# Patient Record
Sex: Male | Born: 1950 | Race: White | Hispanic: No | Marital: Married | Smoking: Former smoker
Health system: Southern US, Community
[De-identification: ages and names within clinical notes are randomized; demographics above are authoritative.]

## PROBLEM LIST (undated history)

## (undated) DIAGNOSIS — M2550 Pain in unspecified joint: Secondary | ICD-10-CM

## (undated) DIAGNOSIS — F5104 Psychophysiologic insomnia: Secondary | ICD-10-CM

## (undated) DIAGNOSIS — M069 Rheumatoid arthritis, unspecified: Secondary | ICD-10-CM

## (undated) DIAGNOSIS — Q676 Pectus excavatum: Secondary | ICD-10-CM

## (undated) DIAGNOSIS — M1990 Unspecified osteoarthritis, unspecified site: Secondary | ICD-10-CM

## (undated) DIAGNOSIS — M7630 Iliotibial band syndrome, unspecified leg: Secondary | ICD-10-CM

## (undated) DIAGNOSIS — R399 Unspecified symptoms and signs involving the genitourinary system: Secondary | ICD-10-CM

## (undated) DIAGNOSIS — I10 Essential (primary) hypertension: Secondary | ICD-10-CM

## (undated) DIAGNOSIS — Z9189 Other specified personal risk factors, not elsewhere classified: Secondary | ICD-10-CM

## (undated) DIAGNOSIS — Z973 Presence of spectacles and contact lenses: Secondary | ICD-10-CM

## (undated) DIAGNOSIS — M7662 Achilles tendinitis, left leg: Secondary | ICD-10-CM

## (undated) DIAGNOSIS — K219 Gastro-esophageal reflux disease without esophagitis: Secondary | ICD-10-CM

## (undated) DIAGNOSIS — J189 Pneumonia, unspecified organism: Secondary | ICD-10-CM

## (undated) DIAGNOSIS — M7711 Lateral epicondylitis, right elbow: Secondary | ICD-10-CM

## (undated) DIAGNOSIS — E7800 Pure hypercholesterolemia, unspecified: Secondary | ICD-10-CM

## (undated) DIAGNOSIS — R0600 Dyspnea, unspecified: Secondary | ICD-10-CM

## (undated) DIAGNOSIS — G8929 Other chronic pain: Secondary | ICD-10-CM

## (undated) DIAGNOSIS — C61 Malignant neoplasm of prostate: Secondary | ICD-10-CM

## (undated) DIAGNOSIS — M5030 Other cervical disc degeneration, unspecified cervical region: Secondary | ICD-10-CM

## (undated) DIAGNOSIS — G629 Polyneuropathy, unspecified: Secondary | ICD-10-CM

## (undated) DIAGNOSIS — M542 Cervicalgia: Secondary | ICD-10-CM

## (undated) DIAGNOSIS — H209 Unspecified iridocyclitis: Secondary | ICD-10-CM

## (undated) DIAGNOSIS — Z87898 Personal history of other specified conditions: Secondary | ICD-10-CM

## (undated) DIAGNOSIS — Z8679 Personal history of other diseases of the circulatory system: Secondary | ICD-10-CM

## (undated) HISTORY — DX: Essential (primary) hypertension: I10

## (undated) HISTORY — DX: Iliotibial band syndrome, unspecified leg: M76.30

## (undated) HISTORY — PX: CARPAL TUNNEL RELEASE: SHX101

## (undated) HISTORY — DX: Psychophysiologic insomnia: F51.04

## (undated) HISTORY — PX: TONSILLECTOMY: SUR1361

## (undated) HISTORY — PX: SHOULDER SURGERY: SHX246

## (undated) HISTORY — PX: CARPOMETACARPAL (CMC) FUSION OF THUMB: SHX6290

## (undated) HISTORY — DX: Rheumatoid arthritis, unspecified: M06.9

## (undated) HISTORY — DX: Pure hypercholesterolemia, unspecified: E78.00

## (undated) HISTORY — PX: ANTERIOR CERVICAL DECOMP/DISCECTOMY FUSION: SHX1161

---

## 1983-10-19 DIAGNOSIS — Z8679 Personal history of other diseases of the circulatory system: Secondary | ICD-10-CM

## 1983-10-19 HISTORY — DX: Personal history of other diseases of the circulatory system: Z86.79

## 2000-09-21 ENCOUNTER — Encounter: Payer: Self-pay | Admitting: Neurology

## 2000-09-21 ENCOUNTER — Ambulatory Visit (HOSPITAL_COMMUNITY): Admission: RE | Admit: 2000-09-21 | Discharge: 2000-09-21 | Payer: Self-pay | Admitting: Neurology

## 2003-11-27 ENCOUNTER — Ambulatory Visit (HOSPITAL_COMMUNITY): Admission: RE | Admit: 2003-11-27 | Discharge: 2003-11-27 | Payer: Self-pay | Admitting: Gastroenterology

## 2011-06-02 ENCOUNTER — Ambulatory Visit (INDEPENDENT_AMBULATORY_CARE_PROVIDER_SITE_OTHER): Payer: PRIVATE HEALTH INSURANCE | Admitting: Sports Medicine

## 2011-06-02 VITALS — BP 133/86 | Ht 76.0 in | Wt 194.0 lb

## 2011-06-02 DIAGNOSIS — M19049 Primary osteoarthritis, unspecified hand: Secondary | ICD-10-CM

## 2011-06-02 DIAGNOSIS — M189 Osteoarthritis of first carpometacarpal joint, unspecified: Secondary | ICD-10-CM

## 2011-06-02 MED ORDER — KETOPROFEN POWD: Status: DC

## 2011-06-02 MED ORDER — TRAMADOL HCL 50 MG PO TABS: 50.0000 mg | ORAL_TABLET | Freq: Four times a day (QID) | ORAL | Status: AC | PRN

## 2011-06-02 NOTE — Assessment & Plan Note (Signed)
Options for his [facility] joint arthritis include the following: Trial of conservative therapy for the hands including warm baths Work on easy motion of his hands to preserve his grip  If the medications listed above do not help we can consider injecting the joints  Ultimately if the pain got too severe he could see the hand surgeons  We will send a copy of this note to Dr. Doristine Counter  I would like to recheck his examination in 6 weeks.

## 2011-06-02 NOTE — Progress Notes (Signed)
  Subjective:    Patient ID: Isaiah Price, male    DOB: April 01, 1951, 60 y.o.   MRN: [id number]  HPI This patient is referred courtesy of Dr. Marjory Lies of [facility] in [place]  For past 2 years a lot of pain in both thumbs When this started it really got red and swollen After that very often more pain at end of day Steadily increasing stiffness Now can't grip door knob or open jar lid  Failed cervical fusion in 1991 Now with periodic pain that shoots into arms Ambien helps him sleep at night, hands do not wake him Uses hydrocodone as needed when pain is severe  Had bleeding problem with mobic  As documented in a recent history by Dr. Doristine Counter the patient does have multiple joint issues including cervical spine, both shoulders and lumbar spine. Today he wanted to focus on his thumbs as those were the things that are giving him the most symptoms at present     Review of Systems     Objective:   Physical Exam NAD, alert and pleasant  Tenderness at [facility] bilat Puffiness over hypothenar area from cmc to 1st MTP R > L Unable to make fist Has strong pinch bilaterally Flexion to 30 degrees on lt feels pain Flexion to 25 degrees on rt feels pain oslers nodes bilaterally Rt 2nd MTP joint swollen   Musculoskeletal ultrasound The right hand is scant over the [facility] joint in that joint has a large spur as well as a significant effusion and calcification scattered through the joint space The first and second MTP joints of the right hand showing mild changes of arthritis  The left hand shows that the [facility] joint has lost almost all of its joint space and has marked calcification and irregularity over the joint There is a smaller effusion present today  The left first MTP joint shows no significant arthritis      Assessment & Plan:

## 2011-06-02 NOTE — Patient Instructions (Addendum)
Rub ketoprofen gel on thumbs at least 4 times per day  Put hands in warm water a couple times per day and squeeze a soft ball to preserve thumb motion   Follow up in 6 weeks

## 2011-06-02 NOTE — Assessment & Plan Note (Signed)
For his pain we suggested that he use topical ketoprofen gel as often as 4-5 times a day to see if this gives him some relief. He cannot take oral NSAIDs as he has had GI bleeding with these  We also gave him some tramadol 50 mg to try to 4 times a day for his other generalized joint pain. I explained him that I thought this might be milder than the hydrocodone which really helps him. However he is reluctant to take very much hydrocodone.

## 2011-07-14 ENCOUNTER — Encounter: Payer: Self-pay | Admitting: Sports Medicine

## 2011-07-14 ENCOUNTER — Ambulatory Visit (INDEPENDENT_AMBULATORY_CARE_PROVIDER_SITE_OTHER): Payer: PRIVATE HEALTH INSURANCE | Admitting: Sports Medicine

## 2011-07-14 VITALS — BP 120/81 | HR 97 | Ht 75.0 in | Wt 190.0 lb

## 2011-07-14 DIAGNOSIS — M189 Osteoarthritis of first carpometacarpal joint, unspecified: Secondary | ICD-10-CM

## 2011-07-14 DIAGNOSIS — M19049 Primary osteoarthritis, unspecified hand: Secondary | ICD-10-CM

## 2011-07-14 DIAGNOSIS — M5412 Radiculopathy, cervical region: Secondary | ICD-10-CM

## 2011-07-14 MED ORDER — GABAPENTIN 300 MG PO CAPS: 300.0000 mg | ORAL_CAPSULE | Freq: Every day | ORAL | Status: DC

## 2011-07-14 NOTE — Progress Notes (Signed)
  Subjective:    Patient ID: Isaiah Price, male    DOB: 15-Sep-1951, 60 y.o.   MRN: [id number]  HPI 1.  Thumb pain:  Here for follow up of thumb pain.  Diagnosed with arthritis of the [facility] at prior visit.  Has been using ketoprofen gel since that time, which has helped some.  He still has pain with gripping and feels like he does not have good strength in his thumbs.  He was taking tramadol which was helping somewhat, but he has stopped this secondary to prostate issues.  Of note he does have a history of an anterior cervical diskectomy with a failed fusion of that area.  He does get occasional numbness and tingling down his arms and hands related to this. He does get pain with too much neck motion and also does some low back pain at times   Review of Systems     Objective:   Physical Exam Hand exam: Inspection of hands shows thenar wasting as well as squaring at the [facility] joint bilaterally.  He is tender to palpation along the [facility] joint, with mild swelling. AROM is limited in all directions, especially flexion, PROM is mildly painful but overall pretty good Strength in thumb is 4/5 with flexion, extension, adduction and abduction  Cervical motion is somewhat stiff and extension can be uncomfortable         Assessment & Plan:

## 2011-07-14 NOTE — Assessment & Plan Note (Signed)
See the attached plan. We will start at a very low dose of gabapentin. Hopefully he can stop the Ambien. If gabapentin works well we may consider building the dose later  [place]bon copy to Dr. Doristine Counter

## 2011-07-14 NOTE — Assessment & Plan Note (Addendum)
Pain is improved however strength is still decreased.  I suspect he may have a radiculopathy contributing to his pain and weakness given his cervical disk disease and signs of thenar wasting.  We will start gabapentin 300mg  qhs for now and have him continue his ketoprofen gel.  Suggested he could try paraffin wax dips to help with stiffness.  We will have him follow up in 4-6 weeks.  If the [facility] pain worsens we will inject him under ultrasound guidance

## 2011-07-30 ENCOUNTER — Other Ambulatory Visit: Payer: Self-pay | Admitting: Gastroenterology

## 2011-08-25 ENCOUNTER — Ambulatory Visit: Payer: PRIVATE HEALTH INSURANCE | Admitting: Sports Medicine

## 2011-10-19 HISTORY — PX: CATARACT EXTRACTION W/ INTRAOCULAR LENS  IMPLANT, BILATERAL: SHX1307

## 2012-05-11 ENCOUNTER — Ambulatory Visit (INDEPENDENT_AMBULATORY_CARE_PROVIDER_SITE_OTHER): Payer: PRIVATE HEALTH INSURANCE | Admitting: Sports Medicine

## 2012-05-11 VITALS — BP 147/89

## 2012-05-11 DIAGNOSIS — M189 Osteoarthritis of first carpometacarpal joint, unspecified: Secondary | ICD-10-CM

## 2012-05-11 DIAGNOSIS — M19049 Primary osteoarthritis, unspecified hand: Secondary | ICD-10-CM

## 2012-05-11 NOTE — Assessment & Plan Note (Signed)
He also has significant DIP involvement  He has pain at first MCPs but [place] does not show much DJD in these

## 2012-05-11 NOTE — Progress Notes (Signed)
Isaiah Price is a 61 y.o. male who presents to [facility] today for reevaluation of bilateral thumb CMC arthritis.  Patient was seen last year for bilateral CMC thumb arthritis which was confirmed under ultrasound examination.  The patient went to a hand surgeon who performed an unguided injections in the bilateral thumb CMC.  This had lasting benefit however the procedure was painful.  The injection lasted about 6 months.  He notes continued gradual onset of swelling and pain especially with flexion and abduction of the thumb.  He takes intermittent hydrocodone.  He denies any fevers or chills or loss of sensation in his hands.     PMH reviewed.  History  Substance Use Topics  . Smoking status: Current Everyday Smoker    Types: Cigarettes  . Smokeless tobacco: Not on file   Comment: 8 cigarettes per day  . Alcohol Use: Not on file   ROS as above otherwise neg   Exam:  BP 147/89 Gen: Well NAD MSK: *Examination shows tenderness at the thumb CMC mild synovitis bilaterally.  Strength is intact. Negative Finkelstein's test.  Hand is neurovascularly intact.  Patient has arthritis of the DIP joints on multiple fingers in both hands.   Procedure note: Right thumb CMC injection.  Consent obtained and timeout performed.  Area cleaned with alcohol. And cold spray applied. Sterile ultrasound gel over a sterilized ultrasound probe was placed on to the sterile area to visualize the [facility] joint.  Then a needle was inserted into the joint under ultrasound guidance and a small amount of fluid was injected showing distention of the joint.  5 mg of Kenalog and 0.5 mL of 1% lidocaine were injected into the joint.  Patient tolerated procedure well without any pain.    Procedure note: Left thumb CMC injection.  Consent obtained and timeout performed.  Area cleaned with alcohol. And cold spray applied. Sterile ultrasound gel over a sterilized ultrasound probe was placed on to the sterile area to visualize the  [facility] joint.  Then a needle was inserted into the joint under ultrasound guidance and a small amount of fluid was injected showing distention of the joint.  5 mg of Kenalog and 0.5 mL of 1% lidocaine were injected into the joint.  Patient tolerated procedure well without any pain.

## 2012-05-11 NOTE — Patient Instructions (Addendum)
Thank you for coming in today. I hope that you get lasting benefit from this procedure.  Come back as needed.  Call us if you have fevers, chills or a bright red swollen joint.  You may have some pain tonight but let us know if it is terrible.

## 2012-05-11 NOTE — Assessment & Plan Note (Addendum)
Patient has bilateral CMC thumb arthritis. This is a chronic issue that has worsened bilaterally.  Both CMC joints were injected today under ultrasound guidance.  Plan to followup as needed. Discussed warning signs or symptoms. Please see discharge instructions. Patient expresses understanding. Hopefully this should last several months

## 2012-05-15 ENCOUNTER — Ambulatory Visit (INDEPENDENT_AMBULATORY_CARE_PROVIDER_SITE_OTHER): Payer: PRIVATE HEALTH INSURANCE | Admitting: Sports Medicine

## 2012-05-15 ENCOUNTER — Encounter: Payer: Self-pay | Admitting: Sports Medicine

## 2012-05-15 VITALS — BP 130/80

## 2012-05-15 DIAGNOSIS — M65839 Other synovitis and tenosynovitis, unspecified forearm: Secondary | ICD-10-CM

## 2012-05-15 DIAGNOSIS — M659 Synovitis and tenosynovitis, unspecified: Secondary | ICD-10-CM

## 2012-05-15 MED ORDER — PREDNISONE (PAK) 10 MG PO TABS: 10.0000 mg | ORAL_TABLET | Freq: Every day | ORAL | Status: DC

## 2012-05-15 NOTE — Progress Notes (Signed)
  Subjective:    Patient ID: Isaiah Price, male    DOB: 11/07/1950, 61 y.o.   MRN: [id number]  HPI Cc: Right hand swelling  Patient comes in today complaining of 3-4 days of swelling and pain across the dorsum of his right hand. He received an injection into the right [facility] joint 4 days ago. Joint was a little sore the following day but in the days following the injection he is having increased pain and swelling in the dorsum of the right wrist. Symptoms are worse with finger extension and right wrist extension. Some radiating pain up into the forearm. No fevers or chills. No history of gout. No recent trauma. Only activity that he can recall that may have been a contributing factor was some yard work he did a week ago. No insect bites.   Review of Systems     Objective:   Physical Exam Well-developed, well-nourished. No acute distress  Right wrist shows moderate swelling across the dorsum. Diffuse tenderness to palpation. Pain is worse with active finger extension and wrist extension. No erythema. No skin lesions. Good radial ulnar pulses. Median radial and ulnar nerve function are all intact distally.  MSK ultrasound of the right wrist: Quick view of the dorsum of the wrist shows swelling around the tendons of the third extensor compartment. I do not appreciate any wrist effusion.       Assessment & Plan:  1. Right wrist pain and swelling secondary to extensor tendonosynovitis 2. 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 Although NSAIDS are listed as an allergy on his chart, he has taken prednisone in the past without any difficulty,  He has a body helix wrist brace to wear. Patient will followup with me in 24-hours. If symptoms persist or worsen, I would consider blood work including a CBC, sed rate, and possibly a serum uric acid level.

## 2012-05-16 ENCOUNTER — Encounter: Payer: Self-pay | Admitting: Sports Medicine

## 2012-05-16 ENCOUNTER — Ambulatory Visit (INDEPENDENT_AMBULATORY_CARE_PROVIDER_SITE_OTHER): Payer: PRIVATE HEALTH INSURANCE | Admitting: Sports Medicine

## 2012-05-16 VITALS — BP 136/80

## 2012-05-16 DIAGNOSIS — M65849 Other synovitis and tenosynovitis, unspecified hand: Secondary | ICD-10-CM

## 2012-05-16 DIAGNOSIS — M659 Synovitis and tenosynovitis, unspecified: Secondary | ICD-10-CM

## 2012-05-16 NOTE — Progress Notes (Signed)
  Subjective:    Patient ID: Isaiah Price, male    DOB: [birth year], 61 y.o.   MRN: [id number]  HPI patient comes in today for followup on his right wrist. He started his Sterapred Dosepak yesterday. Pain is somewhat better but he has noticed improvement with wrist mobility. Still no fevers or chills. He is also been using his compression wrap as well as ice.    Review of Systems     Objective:   Physical Exam Well-developed, well-nourished. No acute distress.  Right wrist still shows some mild swelling diffusely across the dorsum of the wrist. He still has limited wrist extension by about 50% but nearly full wrist flexion. No erythema. Area is not warm to touch. Good radial and ulnar pulses. Sensation remains intact distally.   MSK ultrasound right wrist: Quick scan of the dorsum of the wrist shows decreased swelling around the extensor tendons of the fourth compartment, but some edema is still present.       Assessment & Plan:  Improving right wrist pain likely due to steroid flare from recent [facility] injection  We would like for the patient to continue on his Dosepak, but he has a questionable history of early glaucoma. Also a history of uveitis/iritis. He'll contact his ophthalmologist to ensure that it is safe for him to continue on the Dosepak. Continue with his compression wrap and continue with ice. Increase activity as tolerated and followup with me in one week. Call symptoms worsen in the interim.

## 2012-05-19 ENCOUNTER — Other Ambulatory Visit: Payer: Self-pay | Admitting: *Deleted

## 2012-05-19 MED ORDER — PREDNISONE 10 MG PO KIT: PACK | ORAL | Status: DC

## 2012-05-19 NOTE — Progress Notes (Signed)
Pt states his wrist is still significantly painful with minimal improvement since 05/16/12.  He has 2 days of prednisone left, and is taking vicodin that he already had for pain.  Dr. Margaretha Sheffield spoke with pt- who is scheduled for f/u here Monday- he ordered Prednisone DS dose pack 12 day for pt.  Sent to pt's pharamcy- pt is aware of instructions.

## 2012-05-22 ENCOUNTER — Encounter: Payer: Self-pay | Admitting: Sports Medicine

## 2012-05-22 ENCOUNTER — Ambulatory Visit (INDEPENDENT_AMBULATORY_CARE_PROVIDER_SITE_OTHER): Payer: PRIVATE HEALTH INSURANCE | Admitting: Sports Medicine

## 2012-05-22 VITALS — BP 132/86 | HR 77

## 2012-05-22 DIAGNOSIS — M778 Other enthesopathies, not elsewhere classified: Secondary | ICD-10-CM

## 2012-05-22 DIAGNOSIS — M2550 Pain in unspecified joint: Secondary | ICD-10-CM

## 2012-05-22 LAB — CBC
HCT: 44.4 % (ref 39.0–52.0)
Hemoglobin: 15.6 g/dL (ref 13.0–17.0)
RDW: 13.3 % (ref 11.5–15.5)
WBC: 6.6 10*3/uL (ref 4.0–10.5)

## 2012-05-22 LAB — RHEUMATOID FACTOR: Rhuematoid fact SerPl-aCnc: 12 IU/mL (ref <=14)

## 2012-05-22 MED ORDER — PREDNISONE 10 MG PO KIT: PACK | ORAL | Status: DC

## 2012-05-22 NOTE — Progress Notes (Signed)
  Subjective:    Patient ID: Isaiah Price, male    DOB: 08-Nov-1950, 61 y.o.   MRN: [id number]  HPI patient comes in today for followup on right hand pain. I tried to call in a 12 day Sterapred Dosepak over the weekend but unfortunately there was a mix up at the pharmacy. Patient has completed his initial 6 day Dosepak. Pain in his wrist and hand are somewhat better but not resolved. Majority of his pain continues to be in the dorsum of his hand and wrist. No fevers or chills. He is taking Vicodin at night.    Review of Systems     Objective:   Physical Exam  No acute distress. Right hand and wrist still shows some tenderness to palpation along the fourth extensor compartment. Mild soft tissue swelling. No erythema. He has good wrist flexion but limited extension. Good radial and ulnar pulses. Median radial and ulnar nerve function are intact.      Assessment & Plan:  Persistent right wrist pain and swelling  12 day Sterapred Dosepak to take as directed. Of note, the patient did check with his ophthalmologist last week to make sure that it was safe for him to take his medicine. I will check some blood work including a CBC, ANA, rheumatoid factor, sed rate, C-reactive protein, and serum uric acid. I will followup with him via telephone tomorrow with his initial blood work. Although this is likely a tenosynovitis, I cannot completely exclude gout. He can continue on Vicodin each bedtime for severe pain as well.

## 2012-05-25 ENCOUNTER — Telehealth: Payer: Self-pay | Admitting: Sports Medicine

## 2012-05-25 ENCOUNTER — Telehealth: Payer: Self-pay | Admitting: *Deleted

## 2012-05-25 NOTE — Telephone Encounter (Signed)
Spoke with pt- he states his wrist is doing much better- pain less intense, less swelling, increased flexibility.  He is able to use hand more regularly. Advised him to continue following tx plan that he and Dr. Margaretha Sheffield discussed.  Pt agreeable.

## 2012-05-25 NOTE — Telephone Encounter (Signed)
Spoke with patient on the phone about results on 8/6. Bloodwork WNL. No evidence of inflammatory arthropathy. Serum uric acid is WNL. Patient feeling much better on 12 day sterapred dose pak. Will finish prednisone and let me know if symptoms do not resolve.

## 2012-12-11 ENCOUNTER — Ambulatory Visit (INDEPENDENT_AMBULATORY_CARE_PROVIDER_SITE_OTHER): Payer: BC Managed Care – PPO | Admitting: Sports Medicine

## 2012-12-11 ENCOUNTER — Ambulatory Visit
Admission: RE | Admit: 2012-12-11 | Discharge: 2012-12-11 | Disposition: A | Discharge status: Home / Self Care | Payer: BC Managed Care – PPO | Source: Ambulatory Visit | Attending: Sports Medicine | Admitting: Sports Medicine

## 2012-12-11 VITALS — BP 126/78 | Ht 76.0 in | Wt 215.0 lb

## 2012-12-11 DIAGNOSIS — M189 Osteoarthritis of first carpometacarpal joint, unspecified: Secondary | ICD-10-CM

## 2012-12-11 DIAGNOSIS — M79609 Pain in unspecified limb: Secondary | ICD-10-CM

## 2012-12-11 DIAGNOSIS — M79645 Pain in left finger(s): Secondary | ICD-10-CM

## 2012-12-11 DIAGNOSIS — M79644 Pain in right finger(s): Secondary | ICD-10-CM

## 2012-12-11 DIAGNOSIS — M19049 Primary osteoarthritis, unspecified hand: Secondary | ICD-10-CM

## 2012-12-12 ENCOUNTER — Telehealth: Payer: Self-pay | Admitting: Sports Medicine

## 2012-12-12 ENCOUNTER — Telehealth: Payer: Self-pay | Admitting: *Deleted

## 2012-12-12 NOTE — Progress Notes (Signed)
  Subjective:    Patient ID: Isaiah Price, male    DOB: 10-18-1951, 62 y.o.   MRN: [id number]  HPI chief complaint: Bilateral thumb pain  Patient comes in today requesting injections into each of his thumbs. He has a history of bilateral CMC osteoarthritis. He's had 2 previous injections in his thumbs. Last injection was in July. He's had good symptom relief up until a couple of months ago. His left thumb is worse than his right. He's noticed significant decreased range of motion with the left thumb. Pain with gripping and with twisting. No recent trauma. No recent x-rays.  Medical history is unchanged. He is in the process of being referred to a rheumatologist by his primary care physician. He keeps experiencing unexplained swelling as well as diffuse arthralgias. He has had swelling both in his wrist and his foot. He denies any swelling today however.    Review of Systems     Objective:   Physical Exam Well-developed, well-nourished. No acute distress  Examination of each of his thumbs shows limited mobility at the [facility] joint left greater than right. Positive CMC grind bilaterally. No joint effusion. No soft tissue swelling. No tenderness or swelling along the first extensor compartment. Decreased grip bilaterally secondary to pain. He is neurovascular intact distally.       Assessment & Plan:  1. Bilateral thumb pain secondary to [facility] osteoarthritis  I've agreed to reinject each of his CMC joints today. Injections were performed under ultrasound guidance under sterile technique after risks and benefits were explained. This is his third injection in each thumb and I am not overly optimistic that he will get significant symptom relief with continued injections. I've ordered x-rays of each of his thumbs anticipation of referral him to Dr. Amanda Pea. Patient is in agreement with this plan. He states that he is willing to proceed with surgery if necessary. I will call him after I reviewed his  x-raysleft CMC joint.  Consent obtained and verified. Time-out conducted. Noted no overlying erythema, induration, or other signs of local infection. Skin prepped in a sterile fashion. Topical analgesic spray: Ethyl chloride. Joint: left CMC joint Needle: 25g 5/8 inch Completed without difficulty. Meds: 0.5cc 1% lidocaine, 0.5cc of 40mg  depomedrol  Advised to call if fevers/chills, erythema, induration, drainage, or persistent bleeding.   Consent obtained and verified. Time-out conducted. Noted no overlying erythema, induration, or other signs of local infection. Skin prepped in a sterile fashion. Topical analgesic spray: Ethyl chloride. Joint: right CMC joint Needle: 5/8 inch 25g Completed without difficulty. Meds: 0.5cc 1% lidocaine, 0.5cc of 40mg  depomedrol  Advised to call if fevers/chills, erythema, induration, drainage, or persistent bleeding.

## 2012-12-12 NOTE — Telephone Encounter (Signed)
Message copied by Mora Bellman on Tue Dec 12, 2012  1:53 PM ------      Message from: Ralene Cork      Created: Tue Dec 12, 2012  9:48 AM       Please refer to Dr. Amanda Pea at [facility] orthopedics for bilateral end-stage [facility] osteoarthritis.            ----- Message -----         From: Rad Results In Interface         Sent: 12/11/2012   4:23 PM           To: Ralene Cork, DO                   ------

## 2012-12-12 NOTE — Telephone Encounter (Signed)
Referred pt to Dr. Amanda Pea- appt is 02/14/13 at 9 am.  Pt notified of appt info.

## 2012-12-12 NOTE — Telephone Encounter (Signed)
I spoke with Fayrene Fearing today on the phone regarding x-rays of his thumbs. As expected, he has end-stage degenerative changes of each of his CMC joints. I recommended referral to Dr. Onalee Hua for further treatment. Patient is in agreement with this plan. Followup with me when necessary.

## 2013-06-25 ENCOUNTER — Ambulatory Visit (INDEPENDENT_AMBULATORY_CARE_PROVIDER_SITE_OTHER): Payer: BC Managed Care – PPO | Admitting: Sports Medicine

## 2013-06-25 VITALS — BP 122/84 | Ht 75.0 in | Wt 195.0 lb

## 2013-06-25 DIAGNOSIS — M79644 Pain in right finger(s): Secondary | ICD-10-CM

## 2013-06-25 DIAGNOSIS — M79645 Pain in left finger(s): Secondary | ICD-10-CM

## 2013-06-25 DIAGNOSIS — M79609 Pain in unspecified limb: Secondary | ICD-10-CM

## 2013-06-25 DIAGNOSIS — M19049 Primary osteoarthritis, unspecified hand: Secondary | ICD-10-CM

## 2013-06-25 DIAGNOSIS — M189 Osteoarthritis of first carpometacarpal joint, unspecified: Secondary | ICD-10-CM

## 2013-06-25 MED ORDER — METHYLPREDNISOLONE ACETATE 40 MG/ML IJ SUSP
20.0000 mg | Freq: Once | INTRAMUSCULAR | Status: AC
  Administered 2013-06-25: 20 mg via INTRA_ARTICULAR

## 2013-06-26 NOTE — Progress Notes (Signed)
  Subjective:    Patient ID: Isaiah Price, male    DOB: 1951/03/25, 62 y.o.   MRN: [id number]  HPI Patient comes in today requesting repeat bilateral CMC injections. He has a well-documented history of advanced CMC osteoarthritis in each thumb. He has recently seen Dr Amanda Pea and the patient is planning on having surgery in January. He has had a total of 4 injections into each CMC joint. He tells me that Dr. Amanda Pea said that he would be comfortable injecting him monthly until his surgery. However, the last injection that Dr. Amanda Pea did was not under ultrasound guidance and caused the patient quite a bit of pain. Furthermore he did not get any prolonged relief of his pain.    Review of Systems     Objective:   Physical Exam Well-developed, well-nourished. No acute distress. Awake alert and oriented x3  Examination of each thumb shows a markedly positive CMC grind. Limited CMC motion with flexion and extension which reproduces pain. There is prominence of each CMC joint due to joint subluxation. Neurovascularly intact distally at the tip of each thumb.  Recent x-rays of each thumb show marked CMC DJD with joint subluxation       Assessment & Plan:  Bilateral thumb pain secondary to advanced CMC osteoarthritis  I will reinject each CMC joint today under ultrasound. I explained to the patient that there is a good chance that these injections will not help his pain and that surgery is the definitive treatment. He understands. His plan is to move the surgery up from January if he continues to have pain despite today's injections. Followup when necessary.  Consent obtained and verified. Time-out conducted. Noted no overlying erythema, induration, or other signs of local infection. Skin prepped in a sterile fashion. Topical analgesic spray: Ethyl chloride. Joint: left cmc joint Needle: 25g 5/8 inch Completed without difficulty. Meds: 0.5 cc 1% xylocaine, 0.5cc (20mg ) depomedrol  Advised  to call if fevers/chills, erythema, induration, drainage, or persistent bleeding.  Consent obtained and verified. Time-out conducted. Noted no overlying erythema, induration, or other signs of local infection. Skin prepped in a sterile fashion. Topical analgesic spray: Ethyl chloride. Joint: right cmc joint Needle: 25g 5/8 inch Completed without difficulty. Meds: 0.5cc 1% xylocaine, 0.5cc (20mg ) depomedrol  Advised to call if fevers/chills, erythema, induration, drainage, or persistent bleeding.

## 2013-07-04 ENCOUNTER — Ambulatory Visit: Payer: BC Managed Care – PPO | Admitting: Sports Medicine

## 2013-10-03 ENCOUNTER — Ambulatory Visit (INDEPENDENT_AMBULATORY_CARE_PROVIDER_SITE_OTHER): Payer: BC Managed Care – PPO | Admitting: Sports Medicine

## 2013-10-03 VITALS — BP 116/77

## 2013-10-03 DIAGNOSIS — M19049 Primary osteoarthritis, unspecified hand: Secondary | ICD-10-CM

## 2013-10-03 DIAGNOSIS — M189 Osteoarthritis of first carpometacarpal joint, unspecified: Secondary | ICD-10-CM

## 2013-10-03 MED ORDER — METHYLPREDNISOLONE ACETATE 40 MG/ML IJ SUSP
40.0000 mg | Freq: Once | INTRAMUSCULAR | Status: AC
  Administered 2013-10-03: 40 mg via INTRA_ARTICULAR

## 2013-10-03 NOTE — Progress Notes (Signed)
CC: Bilateral thumb pain followup HPI: Patient is a very pleasant 62 year old male with known bilateral end-stage DJD of the [facility] joints who presents for followup of thumb pain. He states that the last injections were very helpful and helped him for 2-3 months. However now his pain has returned. He has been seeing Dr. Amanda Pea for surgical consultation and plans on having his bilateral CMC joints replaced. He is going to have his right [facility] joint done first and is planning on having surgery January or February.  ROS: As above in the HPI. All other systems are stable or negative.  OBJECTIVE: APPEARANCE:  Patient in no acute distress.The patient appeared well nourished and normally developed. HEENT: No scleral icterus. Conjunctiva non-injected Resp: Non labored Skin: No rash MSK:  Thumbs: - positive CMC grind - limited CMC motion with flexion and extension  - prominence of each CMC joint due to joint subluxation - Neurovascularly intact distally   ASSESSMENT: #1. Bilateral end-stage DJD of the [facility] joint   PLAN: Patient would like to go forward with repeat CMC joint injection bilaterally today. We discussed this over the phone with Dr. Amanda Pea in order to avoid a possible consequence of delaying surgery. Dr. Amanda Pea confirmed that injection today is okay and would not delay surgery. Patient was consented for ultrasound-guided [facility] joint injection and risks and benefits discussed.  Consent obtained and verified.  Time-out conducted.  Noted no overlying erythema, induration, or other signs of local infection.  Skin prepped in a sterile fashion.  Topical analgesic spray: Ethyl chloride.  Joint: left cmc joint under ultrasound guidance Needle: 25g 5/8 inch  Completed without difficulty.  Meds: 0.5 cc 1% xylocaine, 0.5cc (20mg ) depomedrol  Advised to call if fevers/chills, erythema, induration, drainage, or persistent bleeding.   Consent obtained and verified.  Time-out conducted.  Noted no  overlying erythema, induration, or other signs of local infection.  Skin prepped in a sterile fashion.  Topical analgesic spray: Ethyl chloride.  Joint: right cmc joint under ultrasound guidance Needle: 25g 5/8 inch  Completed without difficulty.  Meds: 0.5cc 1% xylocaine, 0.5cc (20mg ) depomedrol  Advised to call if fevers/chills, erythema, induration, drainage, or persistent bleeding.

## 2014-01-09 ENCOUNTER — Other Ambulatory Visit: Payer: Self-pay | Admitting: Family Medicine

## 2014-01-09 ENCOUNTER — Ambulatory Visit
Admission: RE | Admit: 2014-01-09 | Discharge: 2014-01-09 | Disposition: A | Discharge status: Home / Self Care | Payer: BC Managed Care – PPO | Source: Ambulatory Visit | Attending: Family Medicine | Admitting: Family Medicine

## 2014-01-09 DIAGNOSIS — M5412 Radiculopathy, cervical region: Secondary | ICD-10-CM

## 2014-01-09 MED ORDER — GADOBENATE DIMEGLUMINE 529 MG/ML IV SOLN
19.0000 mL | Freq: Once | INTRAVENOUS | Status: AC | PRN
  Administered 2014-01-09: 19 mL via INTRAVENOUS

## 2014-02-07 ENCOUNTER — Other Ambulatory Visit: Payer: Self-pay | Admitting: Neurosurgery

## 2014-02-07 DIAGNOSIS — M47812 Spondylosis without myelopathy or radiculopathy, cervical region: Secondary | ICD-10-CM

## 2014-02-18 ENCOUNTER — Other Ambulatory Visit: Payer: Self-pay | Admitting: Neurosurgery

## 2014-02-18 ENCOUNTER — Ambulatory Visit
Admission: RE | Admit: 2014-02-18 | Discharge: 2014-02-18 | Disposition: A | Discharge status: Home / Self Care | Payer: BC Managed Care – PPO | Source: Ambulatory Visit | Attending: Neurosurgery | Admitting: Neurosurgery

## 2014-02-18 DIAGNOSIS — M47812 Spondylosis without myelopathy or radiculopathy, cervical region: Secondary | ICD-10-CM

## 2014-02-18 MED ORDER — DEXAMETHASONE SODIUM PHOSPHATE 4 MG/ML IJ SOLN
8.0000 mg | Freq: Once | INTRAMUSCULAR | Status: AC
  Administered 2014-02-18: 8 mg via INTRA_ARTICULAR

## 2014-02-18 NOTE — Discharge Instructions (Signed)

## 2014-03-07 ENCOUNTER — Encounter: Payer: Self-pay | Admitting: Sports Medicine

## 2014-03-07 ENCOUNTER — Ambulatory Visit (INDEPENDENT_AMBULATORY_CARE_PROVIDER_SITE_OTHER): Payer: BC Managed Care – PPO | Admitting: Sports Medicine

## 2014-03-07 VITALS — BP 135/90 | Ht 75.0 in | Wt 204.0 lb

## 2014-03-07 DIAGNOSIS — M5412 Radiculopathy, cervical region: Secondary | ICD-10-CM

## 2014-03-07 NOTE — Progress Notes (Signed)
Patient ID: Isaiah Price, male   DOB: 09-30-1951, 63 y.o.   MRN: [id number]    [facility] [address] [place], [place] [address] Phone: [phone] Fax: [phone]   Patient Name: Isaiah Price Date of Birth: 07/17/1951 Medical Record Number: [id number] Gender: male Date of Encounter: 03/07/2014  History of Present Illness:  Isaiah Price is a 63 y.o. very pleasant male patient who presents with the following:  Neck pain.   He states that he had a ruptured disk at C5-C6 in 1991 and had a failed cervical fusion at that time. Since then he has had intermittent BL arm pain with radiculopathy. Occasionally, approx 2 x per year, he is having sharp L arm pain he equates with a "pinched nerve". He states that these episodes usually resolve in 4-5 days.   In January he had carpel tunnel release and a [place] surgery for CMC OA. He was doing rehab exercises when he had a recurrence of the symptoms which lasted an atypical 20 days. The pain was so bad he was referred back to Dr. Rita Ohara, his neurosurgeon, who got a new MRI. He offered two additional surgeries which he advised may not be the best course of action. He had bilateral facet injections at C3-C4 and C4-C5 which helped about 2.5 weeks later. He has been back at his basel;ine pain level for about 1 week now.   HE describes his pain as L neck pain "like having a nail driven into his head" radiating to his posterior L arm and down to his elbow in the C7 Distribution.   He has had some improvement with dose packs in the past. He did not tolerate gabapentin. He has taken hydrocodone off and on for th last 6 years to manage his pain.   Patient Active Problem List   Diagnosis Date Noted  . Cervical radiculopathy 07/14/2011  . Arthritis pain, hand 06/02/2011  . 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 No past surgical history on file. History  Substance Use Topics    . Smoking status: Former Smoker    Types: Cigarettes    Quit date: 12/13/2011  . Smokeless tobacco: Never Used  . Alcohol Use: Not on file   No family history on file. Allergies  Allergen Reactions  . Nsaids     Medication list has been reviewed and updated.  Prior to Admission medications   Medication Sig Start Date End Date Taking? Authorizing Provider  diltiazem (CARDIZEM) 90 MG tablet Take 90 mg by mouth 2 (two) times daily.      Historical Provider, MD  ezetimibe (ZETIA) 10 MG tablet Take 10 mg by mouth daily.      Historical Provider, MD  HYDROcodone-acetaminophen (NORCO) 10-325 MG per tablet Take 1 tablet by mouth every 6 (six) hours as needed.    Historical Provider, MD  methylPREDNIsolone (MEDROL DOSPACK) 4 MG tablet  01/07/14   Historical Provider, MD  oxyCODONE (OXY IR/ROXICODONE) 5 MG immediate release tablet  01/12/14   Historical Provider, MD  PredniSONE 10 MG KIT 12 day Prednisone DS dose pack, take as directed. 05/22/12   Carlos Levering Draper, DO  simvastatin (ZOCOR) 40 MG tablet Take 40 mg by mouth every morning.      Historical Provider, MD  tamsulosin (FLOMAX) 0.4 MG CAPS capsule  01/31/14   Historical Provider, MD  zolpidem (AMBIEN) 10 MG tablet Take 10 mg by mouth at bedtime.  Historical Provider, MD    Review of Systems:  Per HPI  Physical Examination: Filed Vitals:   03/07/14 1104  BP: 135/90   Filed Vitals:   03/07/14 1102  Height: 6' 3"  (1.905 m)  Weight: 204 lb (92.534 kg)   Body mass index is 25.5 kg/(m^2).  Gen: NAD, alert, cooperative with exam HEENT: NCAT Neuro: Alert and oriented, 1+ reflexes on R at brachioradialis and biceps, 2+ at triceps L UE with 2+ reflexes at brachiradialis and biceps and 3+ at triceps.  Strength equal and 5/5 in BL UE Paraesthesias described in the distribution of C7 but not radiating below the elbow.   MRI cervical spine 01/09/2014: C5-C6 Fusion, Acute marrow edema at C2-C3 and C4-C5 facets, Severe R and mod L C 7  foraminal stenosis, Severe R and mild L C5 forminal stenosis, moderate L C3 and C4 foraminal stenosis, mild L C8 foraminal stenosis.    Assessment and Plan:   Cervical radiculopathy Recent exacerbation of chronic neck pain seems to be resolving.  - Some improvement after facet injections, Saw Dr. Jola Baptist at [facility] imaging - discussed future treatment plans of pursuing dose pack, repeat facet injections, and PRN Norco.  - Agreed with him and Dr. Rita Ohara that additional surgery os likely not the best course of treatment - follow up PRN     Timmothy Euler, MD

## 2014-03-07 NOTE — Assessment & Plan Note (Signed)
Recent exacerbation of chronic neck pain seems to be resolving.  - Some improvement after facet injections, Saw Dr. Jola Baptist at [facility] imaging - discussed future treatment plans of pursuing dose pack, repeat facet injections, and PRN Norco.  - Agreed with him and Dr. Rita Ohara that additional surgery os likely not the best course of treatment - follow up PRN

## 2014-03-14 ENCOUNTER — Other Ambulatory Visit: Payer: Self-pay | Admitting: *Deleted

## 2014-03-14 DIAGNOSIS — M5412 Radiculopathy, cervical region: Secondary | ICD-10-CM

## 2014-03-15 ENCOUNTER — Other Ambulatory Visit: Payer: Self-pay | Admitting: Sports Medicine

## 2014-03-15 DIAGNOSIS — M542 Cervicalgia: Secondary | ICD-10-CM

## 2014-04-22 ENCOUNTER — Other Ambulatory Visit: Payer: BC Managed Care – PPO

## 2014-04-23 ENCOUNTER — Ambulatory Visit (INDEPENDENT_AMBULATORY_CARE_PROVIDER_SITE_OTHER): Payer: BC Managed Care – PPO | Admitting: Sports Medicine

## 2014-04-23 ENCOUNTER — Encounter: Payer: Self-pay | Admitting: Sports Medicine

## 2014-04-23 ENCOUNTER — Ambulatory Visit
Admission: RE | Admit: 2014-04-23 | Discharge: 2014-04-23 | Disposition: A | Discharge status: Home / Self Care | Payer: BC Managed Care – PPO | Source: Ambulatory Visit | Attending: Sports Medicine | Admitting: Sports Medicine

## 2014-04-23 VITALS — BP 123/78 | Ht 75.0 in | Wt 205.0 lb

## 2014-04-23 DIAGNOSIS — M19249 Secondary osteoarthritis, unspecified hand: Secondary | ICD-10-CM

## 2014-04-23 DIAGNOSIS — M25551 Pain in right hip: Secondary | ICD-10-CM

## 2014-04-23 DIAGNOSIS — M25559 Pain in unspecified hip: Secondary | ICD-10-CM

## 2014-04-23 DIAGNOSIS — M1832 Unilateral post-traumatic osteoarthritis of first carpometacarpal joint, left hand: Secondary | ICD-10-CM

## 2014-04-23 MED ORDER — METHYLPREDNISOLONE ACETATE 40 MG/ML IJ SUSP
40.0000 mg | Freq: Once | INTRAMUSCULAR | Status: AC
Start: 2014-04-23 — End: 2014-04-23
  Administered 2014-04-23: 20 mg via INTRA_ARTICULAR

## 2014-04-23 MED ORDER — TRAMADOL HCL 50 MG PO TABS: 50.0000 mg | ORAL_TABLET | Freq: Two times a day (BID) | ORAL | Status: DC | PRN

## 2014-04-23 NOTE — Progress Notes (Addendum)
   Subjective:    Patient ID: Isaiah Price, male    DOB: Oct 29, 1950, 63 y.o.   MRN: [id number]  HPI Patient comes in today requesting a repeat cortisone injection into his left CMC joint. He has a documented history of bilateral CMC DJD. He is status post right [facility] joint surgery done by Dr. Amedeo Plenty in January of this year. Dr.Gramig told him that it would be about 12 months before he was happy with the results. As a result, he has not yet ready to pursue surgery on his left thumb. He has had good success with injections in the past. He is also complaining of some right hip pain. He states that he has had intermittent hip pain since he was a child. Majority of his pain is along the lateral aspect of his hip. No groin pain. Pain does keep him awake at night.    Review of Systems     Objective:   Physical Exam Well-developed, well-nourished. No acute distress. Awake alert and oriented x3. Vital signs reviewed  Left thumb: Limited range of motion secondary to pain. There is mild swelling at the [facility] joint. There is tenderness to palpation here as well. Positive CMC grind. No tenderness along the first extensor compartment. Neurovascularly intact distally.  Right hip: Smooth, full hip range of motion. There is reproducible pain with passive external rotation which he localizes to the lateral hip. Tenderness to palpation over the greater trochanter. Negative straight leg raise. Neurovascularly intact distally. Walking with a slight limp.       Assessment & Plan:  1. Left thumb pain secondary to [facility] osteoarthritis 2. Status post right [facility] joint surgery 3. Right hip pain secondary to DJD versus greater trochanter bursitis  Left CMC joint was injected today with cortisone. Injection was performed after risks and benefits were explained including the risk of hypopigmentation. This was done under ultrasound guidance to help visualize the [facility] joint. Patient tolerated this without difficulty. He'll get  x-rays of his right hip to include an AP pelvis and lateral right hip and he will followup with me in the office in one to 2 weeks. We may consider a cortisone injection for greater trochanteric bursitis if his symptoms persist. In the meantime, I will try him on some tramadol when necessary.  Consent obtained and verified. Time-out conducted. Noted no overlying erythema, induration, or other signs of local infection. Skin prepped in a sterile fashion. Topical analgesic spray: Ethyl chloride. Joint: left cmc joint Needle: 25g 5/8 inch Completed without difficulty. Meds: 1/2 cc 1%xylocaine, 1/2cc (40 mg) depomedrol  Advised to call if fevers/chills, erythema, induration, drainage, or persistent bleeding.   Addendum: X-rays of the right hip show only some minimal degenerative changes. Nothing acute.

## 2014-05-01 ENCOUNTER — Other Ambulatory Visit: Payer: Self-pay | Admitting: Sports Medicine

## 2014-05-01 ENCOUNTER — Ambulatory Visit
Admission: RE | Admit: 2014-05-01 | Discharge: 2014-05-01 | Disposition: A | Discharge status: Home / Self Care | Payer: BC Managed Care – PPO | Source: Ambulatory Visit | Attending: Sports Medicine | Admitting: Sports Medicine

## 2014-05-01 DIAGNOSIS — M542 Cervicalgia: Secondary | ICD-10-CM

## 2014-05-01 MED ORDER — DEXAMETHASONE SODIUM PHOSPHATE 4 MG/ML IJ SOLN
4.0000 mg | Freq: Once | INTRAMUSCULAR | Status: AC
  Administered 2014-05-01: 4 mg via INTRA_ARTICULAR

## 2014-05-01 NOTE — Discharge Instructions (Signed)

## 2014-05-06 ENCOUNTER — Ambulatory Visit (INDEPENDENT_AMBULATORY_CARE_PROVIDER_SITE_OTHER): Payer: BC Managed Care – PPO | Admitting: Sports Medicine

## 2014-05-06 ENCOUNTER — Encounter: Payer: Self-pay | Admitting: Sports Medicine

## 2014-05-06 VITALS — BP 119/87 | HR 101 | Ht 74.0 in | Wt 205.0 lb

## 2014-05-06 DIAGNOSIS — M7630 Iliotibial band syndrome, unspecified leg: Secondary | ICD-10-CM

## 2014-05-06 DIAGNOSIS — M629 Disorder of muscle, unspecified: Secondary | ICD-10-CM

## 2014-05-06 DIAGNOSIS — M7631 Iliotibial band syndrome, right leg: Secondary | ICD-10-CM

## 2014-05-06 DIAGNOSIS — M2420 Disorder of ligament, unspecified site: Secondary | ICD-10-CM

## 2014-05-06 HISTORY — DX: Iliotibial band syndrome, unspecified leg: M76.30

## 2014-05-06 NOTE — Progress Notes (Signed)
  Isaiah Price - 63 y.o. male MRN [id number]  Date of birth: [birth year]    SUBJECTIVE:      Isaiah Price comes today for evaluation of right hip pain.  He reports he has been aggravated by similar right hip pain for most of his life.  He describes the pain as constant, aching in the lateral aspect of his right hip that radiates down to his right knee.  Pain is made worse with long periods of standing, or lying on either side.  He is begun exercising in the past 6 weeks, and has noticed walk on the treadmill tends to make his pain worse.  He has switched biking and noticed some improvement with that.  He denies any previous right hip or knee injuries.  He denies any fevers, chills, weight loss.  Left CMC: He reports some relief, but not as much as her last injection.  He has begun some strength training at the gym in the last 6 weeks  ROS:     See HPI  PERTINENT  PMH / [place] [facility] / / SH:  Past Medical, Surgical, Social, and Family History Reviewed & Updated in the EMR.  Pertinent findings include:   None  OBJECTIVE: BP 119/87  Pulse 101  Ht 6\' 2"  (1.88 m)  Wt 205 lb (92.987 kg)  BMI 26.31 kg/m2  Physical Exam:  Vital signs are reviewed.  R Hip: Pelvic alignment unremarkable to inspection and palpation. ROM: Full IR, EF, Flexion, Extsion Strength: Extension: 5/5, Abduction: 5/5, Adduction: 5/5 Standing hip rotation and gait without trendelenburg sign / unsteadiness. Greater trochanter tenderness to palpation as well as IT band tenderness Lateral pain with FABER No SI joint tenderness and normal minimal SI movement.  DG Hip Reviewed: Mild arthritic changes bilaterally  ASSESSMENT & PLAN:  See problem based charting & AVS for pt instructions.

## 2014-05-06 NOTE — Assessment & Plan Note (Signed)
Chronic in nature, recent exacerbation since beginning exercising - Reviewed x-rays with patient's and reassured that arthritic changes and hips are mild - Discussed options for steroid injection versus physical therapy versus continued current exercise/stretching routine - He desires to continue with his current exercise program; getting IT band rolled after workout - Followup when necessary if pain becomes worse

## 2014-05-10 ENCOUNTER — Telehealth: Payer: Self-pay | Admitting: Sports Medicine

## 2014-05-10 ENCOUNTER — Other Ambulatory Visit: Payer: Self-pay | Admitting: *Deleted

## 2014-05-10 MED ORDER — PREDNISONE (PAK) 10 MG PO TABS: ORAL_TABLET | Freq: Every day | ORAL | Status: DC

## 2014-05-10 MED ORDER — HYDROCODONE-ACETAMINOPHEN 10-325 MG PO TABS: 1.0000 | ORAL_TABLET | Freq: Four times a day (QID) | ORAL | Status: DC | PRN

## 2014-05-10 NOTE — Telephone Encounter (Signed)
I received a call from [place] today. He is complaining of worsening cervical radiculopathy. We will treat this with a 6 day double strength Sterapred Dosepak. I've also agreed to refill a limited amount of his hydrocodone. If symptoms persist or worsen through the weekend he is instructed to call the office first thing Monday morning for a work in appointment.

## 2014-11-26 ENCOUNTER — Ambulatory Visit (INDEPENDENT_AMBULATORY_CARE_PROVIDER_SITE_OTHER): Payer: BLUE CROSS/BLUE SHIELD | Admitting: Sports Medicine

## 2014-11-26 ENCOUNTER — Encounter: Payer: Self-pay | Admitting: Sports Medicine

## 2014-11-26 VITALS — BP 116/78 | HR 93 | Ht 75.0 in | Wt 208.0 lb

## 2014-11-26 DIAGNOSIS — M5412 Radiculopathy, cervical region: Secondary | ICD-10-CM

## 2014-11-26 NOTE — Progress Notes (Signed)
   Subjective:    Patient ID: Isaiah Price, male    DOB: 26-Dec-1950, 64 y.o.   MRN: [id number]  HPI  Patient comes in today for returning neck pain. Please see the previous office notes for details regarding history. In short he is status post remote C5-C6 ACDF. MRI scan done in 2015 showed multilevel degenerative changes including severe facet hypertrophy and arthropathy. Patient has undergone 2 rounds of facet injections at [place] and has had a positive response to each series. His pain is beginning to return. He is status post left thumb surgery for [facility] osteoarthritis done by Dr. Amedeo Plenty. He is doing well postoperatively. His pain is primarily along the left side of his neck with radiating discomfort into the left shoulder. No radiating pain down the left arm. No associated numbness or tingling. He is also complaining of some left eye irritation. He states that he gets left eye irritation any time his joints get "inflamed". He has also had difficulty in the past with urination. He has undergone rheumatologic workup in the past but he has not been diagnosed with any sort of rheumatologic disorder.   Review of Systems     Objective:   Physical Exam Well-developed, well-nourished. No acute distress. Awake alert and oriented 3. Vital signs reviewed.  Cervical spine: Limited cervical mobility and all planes. Slight tenderness to palpation along the left paraspinal musculature and into the left trapezius. Neurological exam shows good strength in both upper extremities. 3/4 biceps reflex on the left compared to 1-2/4 on the right. 2/4 triceps reflexes bilaterally. 2/4 brachial radialis reflexes bilaterally. Sensation is intact to light touch grossly.  HEENT: Left eye shows conjunctival irritation. No purulent discharge.       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 Rolla Flatten). Given the patient's diffuse arthropathy, iritis, and history of difficulty urinating I am very concerned that he may be dealing with Reiter syndrome. I encouraged him to get blood work today including an HLA-B27 but the patient refused. I encouraged him to do some research on Reiter syndrome and to reconsider getting tested for it. He says he will "think about it".

## 2014-11-26 NOTE — Patient Instructions (Signed)
You will get called about repeat facet injections appointment. 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 Read up about this and reconsider testing for it.  Best.

## 2014-11-28 ENCOUNTER — Other Ambulatory Visit: Payer: Self-pay | Admitting: Sports Medicine

## 2014-11-28 DIAGNOSIS — M542 Cervicalgia: Secondary | ICD-10-CM

## 2014-12-02 ENCOUNTER — Inpatient Hospital Stay: Admission: RE | Admit: 2014-12-02 | Payer: BLUE CROSS/BLUE SHIELD | Source: Ambulatory Visit

## 2014-12-05 ENCOUNTER — Other Ambulatory Visit: Payer: Self-pay | Admitting: Sports Medicine

## 2014-12-05 ENCOUNTER — Ambulatory Visit
Admission: RE | Admit: 2014-12-05 | Discharge: 2014-12-05 | Disposition: A | Discharge status: Home / Self Care | Payer: BLUE CROSS/BLUE SHIELD | Source: Ambulatory Visit | Attending: Sports Medicine | Admitting: Sports Medicine

## 2014-12-05 DIAGNOSIS — M542 Cervicalgia: Secondary | ICD-10-CM

## 2014-12-05 MED ORDER — DEXAMETHASONE SODIUM PHOSPHATE 4 MG/ML IJ SOLN
1.0000 mg | Freq: Once | INTRAMUSCULAR | Status: AC
  Administered 2014-12-05: 1 mg via INTRA_ARTICULAR

## 2014-12-05 MED ORDER — IOHEXOL 300 MG/ML  SOLN
1.0000 mL | Freq: Once | INTRAMUSCULAR | Status: AC | PRN
  Administered 2014-12-05: 3 mL via EPIDURAL

## 2014-12-05 NOTE — Discharge Instructions (Signed)

## 2014-12-09 ENCOUNTER — Other Ambulatory Visit: Payer: Self-pay | Admitting: *Deleted

## 2014-12-09 MED ORDER — HYDROCODONE-ACETAMINOPHEN 10-325 MG PO TABS: 1.0000 | ORAL_TABLET | Freq: Four times a day (QID) | ORAL | Status: DC | PRN

## 2014-12-10 ENCOUNTER — Telehealth: Payer: Self-pay | Admitting: Sports Medicine

## 2014-12-10 ENCOUNTER — Other Ambulatory Visit: Payer: Self-pay | Admitting: *Deleted

## 2014-12-10 ENCOUNTER — Telehealth: Payer: Self-pay | Admitting: *Deleted

## 2014-12-10 DIAGNOSIS — M5412 Radiculopathy, cervical region: Secondary | ICD-10-CM

## 2014-12-10 NOTE — Telephone Encounter (Signed)
Spoke with [facility] about the cervical injection, they will call pt to setup appt.  Patient picked up his pain med prescription and will let us know if he doesn't hear back from Imaging for the appt.

## 2014-12-10 NOTE — Telephone Encounter (Signed)
-----   Message from Thurman Coyer, DO sent at 12/10/2014  8:23 AM EST ----- Regarding: FW: phone message Contact: [phone] Call [facility] imaging and schedule a cervical ESI for this patient. He recently had facet injections done (last week) so I'm not sure exactly when he can have another injection.  ----- Message -----    From: Laurey Arrow, RN    Sent: 12/09/2014  10:01 AM      To: Thurman Coyer, DO Subject: FW: phone message                              Let me know what to tell him  ----- Message -----    From: Carolyne Littles    Sent: 12/09/2014   8:45 AM      To: Laurey Arrow, RN Subject: phone message                                  Had neck injection and is still having pain down left arm. Please call on next step.

## 2014-12-10 NOTE — Telephone Encounter (Signed)
I spoke with the patient on the phone today. He is still having left-sided neck pain and left arm radiculopathy despite recent facet injections. MRI of his cervical spine done in March 2015 showed moderate to severe bilateral C7 foraminal stenosis in addition to facet arthropathy. Therefore, I think we should try a diagnostic/therapeutic cervical ESI. I have also refilled his hydrocodone for him to take only for severe pain. He will call me once again after his cervical ESI. If symptoms persist or worsen he may need to return to Dr. Sherwood Gambler.

## 2014-12-11 ENCOUNTER — Other Ambulatory Visit: Payer: Self-pay | Admitting: Sports Medicine

## 2014-12-11 DIAGNOSIS — M5412 Radiculopathy, cervical region: Secondary | ICD-10-CM

## 2014-12-17 ENCOUNTER — Other Ambulatory Visit: Payer: BLUE CROSS/BLUE SHIELD

## 2014-12-23 ENCOUNTER — Encounter: Payer: Self-pay | Admitting: Sports Medicine

## 2014-12-23 ENCOUNTER — Ambulatory Visit (INDEPENDENT_AMBULATORY_CARE_PROVIDER_SITE_OTHER): Payer: BLUE CROSS/BLUE SHIELD | Admitting: Sports Medicine

## 2014-12-23 VITALS — BP 129/81 | Ht 75.0 in | Wt 208.0 lb

## 2014-12-23 DIAGNOSIS — M5412 Radiculopathy, cervical region: Secondary | ICD-10-CM | POA: Diagnosis not present

## 2014-12-23 LAB — C-REACTIVE PROTEIN: CRP: <0.5 mg/dL (ref <0.60)

## 2014-12-23 LAB — COMPREHENSIVE METABOLIC PANEL
ALBUMIN: 4.5 g/dL (ref 3.5–5.2)
ALT: 29 U/L (ref 0–53)
AST: 16 U/L (ref 0–37)
Alkaline Phosphatase: 76 U/L (ref 39–117)
BUN: 18 mg/dL (ref 6–23)
CO2: 28 mEq/L (ref 19–32)
Calcium: 9.5 mg/dL (ref 8.4–10.5)
Chloride: 103 mEq/L (ref 96–112)
Creat: 1.06 mg/dL (ref 0.50–1.35)
Glucose, Bld: 101 mg/dL — ABNORMAL HIGH (ref 70–99)
Potassium: 4.2 mEq/L (ref 3.5–5.3)
SODIUM: 140 meq/L (ref 135–145)
TOTAL PROTEIN: 7.1 g/dL (ref 6.0–8.3)
Total Bilirubin: 0.9 mg/dL (ref 0.2–1.2)

## 2014-12-23 LAB — RHEUMATOID FACTOR: Rhuematoid fact SerPl-aCnc: 12 IU/mL (ref <=14)

## 2014-12-23 LAB — CBC
HEMATOCRIT: 46.2 % (ref 39.0–52.0)
HEMOGLOBIN: 15.8 g/dL (ref 13.0–17.0)
MCH: 30 pg (ref 26.0–34.0)
MCHC: 34.2 g/dL (ref 30.0–36.0)
MCV: 87.8 fL (ref 78.0–100.0)
MPV: 9.1 fL (ref 8.6–12.4)
PLATELETS: 220 10*3/uL (ref 150–400)
RBC: 5.26 MIL/uL (ref 4.22–5.81)
RDW: 13.7 % (ref 11.5–15.5)
WBC: 6 10*3/uL (ref 4.0–10.5)

## 2014-12-23 MED ORDER — PREDNISONE 10 MG PO TABS: ORAL_TABLET | ORAL | Status: DC

## 2014-12-23 NOTE — Addendum Note (Signed)
Addended by: [place], Josh Nicolosi on: 12/23/2014 03:51 PM   Modules accepted: Orders

## 2014-12-23 NOTE — Assessment & Plan Note (Signed)
Secondary to disc disease, fusion in 1991. Improving since last visit and facet injection. Currently stable/near baseline however pt feels like he may be developing a flare. Pt has seen a neurosurgeon in the past for this and does not have any interest in pursuing operative management. - schedule IR facet/ESI injection today for about 6 weeks  - dose pack prednisone to have on hand for future flare    Concern for underlying rheumatologic disorder: continues to describe multiple symptoms including reactive eye condition (uveitis? He is unable to recall what he was diagnosed with exactly). Has had a prior rheum workup, however pt is interested in referral for possible retesting  - referral to rheumatology  - will draw initial labs today CMP, CBC, CRP, ESR, ANA, CCP, B27, RF

## 2014-12-23 NOTE — Progress Notes (Signed)
  Isaiah Price - 64 y.o. male MRN [id number]  Date of birth: [birth year]  SUBJECTIVE:  Including CC & ROS.  Chief Complaint  Patient presents with  . Neck Pain   Isaiah Price presents today for follow up of neck pain. Briefly, underwent cervical C5-C6 fusion in 1991 with subsequent recurring neck pain radiating into left arm. See prior notes for full history. He has had facet joint injections in May and July 2015, most recently in February 2016. First injection did not help, second in July provided relief, and most recent was initially not helping so scheduled for [facility] but cancelled this appt as he eventually did have the pain improve. He currently states he is doing okay, feels like he could potentially develop into a worse flare at any point. Neck pain has 2 primary trigger points, left suboccipital and right paraspinal in area of C3-4. Pain radiates into left posterior shoulder and left posterior arm to about level of elbow, sometimes radiates past and down into hand.   As previously discussed, he has had a rheumatologic workup in the past (2013) with the only thing he can remember being mildly positive was RF. He has a history of reactive inflammatory changes in his eye (has seen eye doctor for this) during flares. He also states that he has had issues with urinating during the last major flare (reports urologic workup including biopsies for several masses, had PIN but no cancer)  ROS: no difficulty swallowing, +severe heartburn; otherwise systems reviewed and are negative.   HISTORY: Past Medical, Surgical, Social, and Family History Reviewed & Updated per EMR.   Pertinent Historical Findings include: Cervical fusion 1991 Left and right thumb replacements  PHYSICAL EXAM:  VS: BP:129/81 mmHg  HR: bpm  TEMP: ( )  RESP:   HT:$Re'6\' 3"'Rnx$  (190.5 cm)   WT:208 lb (94.348 kg)  BMI:26.1 PHYSICAL EXAM: General: NAD Psych: appears anxious, normal thought content. Neck INSPECTION: no deformities  noted PALPATION: no palpable defects RANGE OF MOTION: limited in all directions Left arm: INSPECTION: normal appearance PALPATION: no tenderness, no deformities RANGE OF MOTION: intact STRENGTH: arm flexion 4/5, extension 4/5, grip strength 4+/5. (Right side strength intact 5/5.) REFLEXES: 3+ biceps, 2+ triceps, 2+ brachioradialis. (2+ on the right side for all)   ASSESSMENT & PLAN: See problem based charting & AVS for pt instructions. 1. Cervical radiculopathy secondary to cervical degenerative disc disease and facet arthropathy - schedule IR facet/ESI injection today for about 6 weeks - dose pack prednisone to have on hand for future flare  2. Concern for underlying rheumatologic disorder: continues to describe multiple symptoms including reactive eye condition (uveitis? He is unable to recall what he was diagnosed with exactly). Has had a prior rheum workup, however pt is interested in referral for possible retesting  - will draw initial labs today CMP, CBC, CRP, ESR, ANA, CCP,HLA B27, RF. I will call patient with those results and, if necessary, will refer to rheumatology.

## 2014-12-24 LAB — SEDIMENTATION RATE: SED RATE: 1 mm/h (ref 0–20)

## 2014-12-24 LAB — ANA: ANA: NEGATIVE

## 2014-12-24 LAB — CYCLIC CITRUL PEPTIDE ANTIBODY, IGG: Cyclic Citrullin Peptide Ab: <2 U/mL (ref 0.0–5.0)

## 2014-12-25 ENCOUNTER — Other Ambulatory Visit: Payer: Self-pay | Admitting: Sports Medicine

## 2014-12-25 DIAGNOSIS — M5412 Radiculopathy, cervical region: Secondary | ICD-10-CM

## 2014-12-25 LAB — HLA-B27 ANTIGEN: DNA RESULT: NOT DETECTED

## 2014-12-26 NOTE — Telephone Encounter (Signed)
I spoke with the patient today on the phone regarding recent blood work. Blood work is unremarkable in regards to systemic arthropathy. This includes a negative HLA-B27.

## 2014-12-31 ENCOUNTER — Ambulatory Visit (INDEPENDENT_AMBULATORY_CARE_PROVIDER_SITE_OTHER): Payer: BLUE CROSS/BLUE SHIELD | Admitting: Sports Medicine

## 2014-12-31 ENCOUNTER — Encounter: Payer: Self-pay | Admitting: Sports Medicine

## 2014-12-31 VITALS — BP 128/83 | HR 102 | Ht 75.0 in | Wt 208.0 lb

## 2014-12-31 DIAGNOSIS — M5412 Radiculopathy, cervical region: Secondary | ICD-10-CM

## 2014-12-31 MED ORDER — HYDROCODONE-ACETAMINOPHEN 10-325 MG PO TABS: 1.0000 | ORAL_TABLET | Freq: Four times a day (QID) | ORAL | Status: DC | PRN

## 2015-01-01 NOTE — Progress Notes (Signed)
Patient ID: Isaiah Price, male   DOB: May 26, 1951, 64 y.o.   MRN: [id number]  Patient comes in today with persistent neck pain. Unfortunately he cannot repeat his facet injections for another month or so. He is flying out of town to visit his daughter next week and is requesting stronger pain medication. He has a prednisone Dosepak at home but is not experiencing any true radiculopathy. Recent blood work was negative for any sort of spondyloarthropathy or rheumatological disorder.  I agreed to refill his hydrocodone. I've given him 20 pills with no additional refill. I once again explained that I will not chronically prescribed narcotics. He will undergo his next round of facet injections as scheduled in one month. If he experiences minimal symptom relief his only other options would be to return to [place] or consider pain management. Patient is not currently interested in surgery so I've asked him to think long and hard about pain management referral. Follow-up when necessary.

## 2015-02-12 ENCOUNTER — Ambulatory Visit
Admission: RE | Admit: 2015-02-12 | Discharge: 2015-02-12 | Disposition: A | Discharge status: Home / Self Care | Payer: BLUE CROSS/BLUE SHIELD | Source: Ambulatory Visit | Attending: Sports Medicine | Admitting: Sports Medicine

## 2015-02-12 ENCOUNTER — Other Ambulatory Visit: Payer: Self-pay | Admitting: Sports Medicine

## 2015-02-12 DIAGNOSIS — M5412 Radiculopathy, cervical region: Secondary | ICD-10-CM

## 2015-02-12 MED ORDER — DEXAMETHASONE SODIUM PHOSPHATE 4 MG/ML IJ SOLN: 7.0000 mg | Freq: Once | INTRAMUSCULAR | Status: DC

## 2015-02-12 MED ORDER — IOHEXOL 300 MG/ML  SOLN: 1.0000 mL | Freq: Once | INTRAMUSCULAR | Status: AC | PRN

## 2015-03-24 ENCOUNTER — Encounter: Payer: Self-pay | Admitting: Sports Medicine

## 2015-03-28 ENCOUNTER — Other Ambulatory Visit: Payer: Self-pay | Admitting: *Deleted

## 2015-03-28 MED ORDER — TRAMADOL HCL 50 MG PO TABS
50.0000 mg | ORAL_TABLET | Freq: Two times a day (BID) | ORAL | Status: DC | PRN
Start: 2015-03-28 — End: 2015-08-12

## 2015-03-28 NOTE — Telephone Encounter (Signed)
Spoke with patient and refilled his tramadol, faxed rx to [facility]

## 2015-08-12 ENCOUNTER — Ambulatory Visit
Admission: RE | Admit: 2015-08-12 | Discharge: 2015-08-12 | Disposition: A | Discharge status: Home / Self Care | Payer: BLUE CROSS/BLUE SHIELD | Source: Ambulatory Visit | Attending: Sports Medicine | Admitting: Sports Medicine

## 2015-08-12 ENCOUNTER — Encounter: Payer: Self-pay | Admitting: Sports Medicine

## 2015-08-12 ENCOUNTER — Ambulatory Visit (INDEPENDENT_AMBULATORY_CARE_PROVIDER_SITE_OTHER): Payer: BLUE CROSS/BLUE SHIELD | Admitting: Sports Medicine

## 2015-08-12 VITALS — BP 139/86 | Ht 75.0 in | Wt 208.0 lb

## 2015-08-12 DIAGNOSIS — M5441 Lumbago with sciatica, right side: Secondary | ICD-10-CM | POA: Diagnosis not present

## 2015-08-12 DIAGNOSIS — M545 Low back pain, unspecified: Secondary | ICD-10-CM

## 2015-08-12 MED ORDER — TRAMADOL HCL 50 MG PO TABS: ORAL_TABLET | ORAL | Status: DC

## 2015-08-12 NOTE — Progress Notes (Signed)
   Subjective:    Patient ID: Isaiah Price, male    DOB: [birth year], 64 y.o.   MRN: [id number]  HPI chief complaint: Low back pain  Patient comes in today complaining of 6 weeks of low back pain. He has had symptoms similar to this before. Several years ago he began to experience low back pain which would radiate into his hips bilaterally. His pain was accompanied by nausea and was relieved with forward flexion. Over time his symptoms resolved. They returned 6 weeks ago. They're similar in nature to what he experienced previously. He describes an aching discomfort that begins in his low back and will radiate into each hip. He also gets radiating pain down the right leg into the lateral aspect of the right lower leg and right foot. His radiating pain is intermittent. No associated numbness or tingling. He does notice some weakness from time to time. Specifically, he states that he feels like he will occasionally "drag his right foot". Pain does awaken him at night. No fevers or chills. No weight loss. No history of cancer. No recent trauma. Symptoms seem to be worse with prolonged standing.  Interim medical history reviewed Medications reviewed Allergies reviewed    Review of Systems    as above Objective:   Physical Exam  Well-developed, well-nourished. No acute distress. Awake alert and oriented 3. Vital signs reviewed.  Lumbar spine: No tenderness to palpation. No spasm. Patient has pain with extension which is alleviated with forward flexion.  Neurological exam: Negative straight leg raise bilaterally. Reflexes are 1/4 at the patellar tendons bilaterally and trace but equal at the Achilles tendons bilaterally. No atrophy. Strength is 5/5 in both lower extremities. Sensation is intact grossly to light touch.       Assessment & Plan:  Low back pain with lumbar radiculopathy  X-rays of the lumbar spine to evaluate for possible lumbar degenerative disc disease or facet arthropathy.  Patient has some prednisone at home and I would like for him to start a 6 day taper starting at 60 mg on day 1 and tapering to 10 mg on day 6. I will refill his tramadol and he can take 1-2 of the 50 mg tablets as needed. I will call him with his x-ray results later this week. If symptoms persist we may need to evaluate further with an MRI of his lumbar spine.

## 2015-08-19 ENCOUNTER — Telehealth: Payer: Self-pay | Admitting: *Deleted

## 2015-08-19 ENCOUNTER — Telehealth: Payer: Self-pay | Admitting: Sports Medicine

## 2015-08-19 NOTE — Telephone Encounter (Signed)
-----   Message from Thurman Coyer, DO sent at 08/19/2015 10:55 AM EDT ----- Regarding: xrays Patient left me a message last week leading me know that he is feeling better on the prednisone. Please give him a call and reassure him that the x-rays of his lumbar spine showed some mild degenerative changes but nothing as severe as what his neck x-rays have shown in the past. ----- Message -----    From: Rad Results In Interface    Sent: 08/12/2015   3:03 PM      To: Thurman Coyer, DO

## 2015-08-19 NOTE — Telephone Encounter (Signed)
Spoke to pt about xray results, was relieved it didn't show anything severe.  Will f/u as needed

## 2015-08-19 NOTE — Telephone Encounter (Signed)
I received a message today on my voicemail from the patient stating that his low back is feeling much better on the steroid taper. We will call him and let him know that his x-rays show only some mild degenerative changes of his lumbar spine and I will see him as needed.

## 2015-08-20 ENCOUNTER — Ambulatory Visit: Payer: BLUE CROSS/BLUE SHIELD | Admitting: Sports Medicine

## 2015-09-23 ENCOUNTER — Ambulatory Visit: Payer: BLUE CROSS/BLUE SHIELD | Admitting: Sports Medicine

## 2015-09-25 ENCOUNTER — Encounter: Payer: Self-pay | Admitting: Sports Medicine

## 2015-09-25 ENCOUNTER — Ambulatory Visit (INDEPENDENT_AMBULATORY_CARE_PROVIDER_SITE_OTHER): Payer: BLUE CROSS/BLUE SHIELD | Admitting: Sports Medicine

## 2015-09-25 VITALS — BP 132/80 | Ht 75.0 in | Wt 208.0 lb

## 2015-09-25 DIAGNOSIS — M5441 Lumbago with sciatica, right side: Secondary | ICD-10-CM

## 2015-09-25 MED ORDER — PREDNISONE 10 MG PO TABS: ORAL_TABLET | ORAL | Status: DC

## 2015-09-25 MED ORDER — HYDROCODONE-ACETAMINOPHEN 10-325 MG PO TABS: 1.0000 | ORAL_TABLET | Freq: Four times a day (QID) | ORAL | Status: DC | PRN

## 2015-09-25 NOTE — Progress Notes (Signed)
   Subjective:    Patient ID: Isaiah Price, male    DOB: [birth year], 64 y.o.   MRN: [id number]  HPI  Patient comes in today with some returning low back pain. Recent x-rays of his lumbar spine showed some mild degenerative changes. He has been experiencing some right leg radiculopathy. Recent Sterapred Dosepak did help him quite a bit. In fact, his symptoms are currently tolerable. He is complaining of some intermittent burning type pain in his left foot. He is requesting refills on his prednisone. I also gave him a prescription for some hydrocodone in March for his neck which was very beneficial. I gave him 20 pills at that time and he has not needed any refills. He is requesting a refill on this as well.    Review of Systems     Objective:   Physical Exam Well-developed, well-nourished. No acute distress  Somewhat Limited lumbar range of motion. No palpable tenderness  Neurological exam shows a negative straight leg raise bilaterally. His strength is 5/5 in both lower extremities with reflexes equal at the Achilles and patellar tendons bilaterally. No atrophy.  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 He likes to keep a [employer] on hand for emergencies. I will refill that for him today. I will also refill his hydrocodone (20 pills with no refills). He has taken it responsibly in the past. I did discussed the merits of further diagnostic imaging in the form of MRI if his symptoms worsen but he would like to hold on that for now. Follow-up as needed.

## 2015-11-13 ENCOUNTER — Other Ambulatory Visit: Payer: Self-pay | Admitting: Sports Medicine

## 2015-12-01 ENCOUNTER — Ambulatory Visit (INDEPENDENT_AMBULATORY_CARE_PROVIDER_SITE_OTHER): Payer: BLUE CROSS/BLUE SHIELD | Admitting: Sports Medicine

## 2015-12-01 ENCOUNTER — Encounter: Payer: Self-pay | Admitting: Sports Medicine

## 2015-12-01 VITALS — BP 135/88 | HR 98 | Ht 75.0 in | Wt 212.0 lb

## 2015-12-01 DIAGNOSIS — M25531 Pain in right wrist: Secondary | ICD-10-CM | POA: Diagnosis not present

## 2015-12-01 MED ORDER — PREDNISONE 10 MG PO TABS: ORAL_TABLET | ORAL | Status: DC

## 2015-12-01 NOTE — Assessment & Plan Note (Signed)
Ulnar wrist pain, radiates proximally to shoulder and distally into 4th and 5th digits, also with popping stiffness in wrist joint, ultrasound shows significant fluid around flexor carpi ulnaris tendon, h/o of several surgeries in that hand - refer to [place], has operated on that hand several times, MRI may be beneficial given unclear diagnosis - 12 day prednisone taper for symptomatic relief - refer rheum for eval for seronegative arthritis given multiple strange arthopathies over several years

## 2015-12-01 NOTE — Progress Notes (Signed)
  Isaiah Price - 65 y.o. male MRN [id number]  Date of birth: 1950-11-24  SUBJECTIVE:  Including CC & ROS.  Pt reports 4 months of ulnar wrist pain and swelling. Pt reports pain that worsens throughout the day and a feeling of stiffness/tightness in that wrist. Pain radiates into 4th and 5th fingers and sometimes up to his elbow or even further. He reports that the joint makes lots of pops and is painful when he moves it. He also complains of exquisite tenderness over one spot on his ulnar wrist. He has had several procedures in the past and has dupuytren's contractures and well as pain over palmar surface of 5th metatarsal head.    HISTORY: Past Medical, Surgical, Social, and Family History Reviewed & Updated per EMR.   Pertinent Historical Findings include: Patient Active Problem List   Diagnosis Date Noted  . Wrist pain, right 12/01/2015  . IT band syndrome 05/06/2014  . Cervical radiculopathy 07/14/2011  . Arthritis pain, hand 06/02/2011  . 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 Does have audible clicking with joint movement. Palpation is normal over metacarpals, navicular, lunate, and TFCC; tendons without tenderness/ swelling Strength 5/5 in all directions without pain. Negative Finkelstein, tinel's and phalens.   ASSESSMENT & PLAN: See problem based charting & AVS for pt instructions. Wrist pain, right Ulnar wrist pain, radiates proximally to shoulder and distally into 4th and 5th digits, also with popping stiffness in wrist joint, ultrasound shows significant  fluid around flexor carpi ulnaris tendon, h/o of several surgeries in that hand - refer to [place], has operated on that hand several times, MRI may be beneficial given unclear diagnosis - 12 day prednisone taper for symptomatic relief - refer rheum for eval for seronegative arthritis given multiple strange arthopathies over several years   Patient seen and evaluated with the resident. I agree with the above plan of care. Patient will be referred to Dr. Amedeo Plenty. He is a well-established patient of his. I would also like to send him back to rheumatology to rule out seronegative arthropathy. He saw a rheumatologist several years ago but does not remember who it was. He does recall that all of his blood work was normal at that time.

## 2015-12-01 NOTE — Patient Instructions (Signed)
[facility] Ortho Dr. Amedeo Plenty [address] [place] [place] Amil [address] [place] [address] [place], [place] [address] [phone]

## 2015-12-02 ENCOUNTER — Encounter: Payer: Self-pay | Admitting: *Deleted

## 2015-12-22 ENCOUNTER — Other Ambulatory Visit: Payer: Self-pay | Admitting: Sports Medicine

## 2015-12-26 ENCOUNTER — Other Ambulatory Visit: Payer: Self-pay | Admitting: Sports Medicine

## 2015-12-31 DIAGNOSIS — G8929 Other chronic pain: Secondary | ICD-10-CM | POA: Insufficient documentation

## 2015-12-31 DIAGNOSIS — F5104 Psychophysiologic insomnia: Secondary | ICD-10-CM | POA: Insufficient documentation

## 2015-12-31 DIAGNOSIS — M1990 Unspecified osteoarthritis, unspecified site: Secondary | ICD-10-CM | POA: Insufficient documentation

## 2015-12-31 DIAGNOSIS — K219 Gastro-esophageal reflux disease without esophagitis: Secondary | ICD-10-CM | POA: Insufficient documentation

## 2015-12-31 DIAGNOSIS — E7800 Pure hypercholesterolemia, unspecified: Secondary | ICD-10-CM

## 2015-12-31 HISTORY — DX: Pure hypercholesterolemia, unspecified: E78.00

## 2015-12-31 HISTORY — DX: Psychophysiologic insomnia: F51.04

## 2016-01-22 ENCOUNTER — Encounter: Payer: Self-pay | Admitting: Sports Medicine

## 2016-01-22 ENCOUNTER — Other Ambulatory Visit: Payer: Self-pay | Admitting: *Deleted

## 2016-01-22 ENCOUNTER — Ambulatory Visit (INDEPENDENT_AMBULATORY_CARE_PROVIDER_SITE_OTHER): Payer: BLUE CROSS/BLUE SHIELD | Admitting: Sports Medicine

## 2016-01-22 VITALS — BP 104/71 | Ht 75.0 in | Wt 208.0 lb

## 2016-01-22 DIAGNOSIS — M5412 Radiculopathy, cervical region: Secondary | ICD-10-CM

## 2016-01-22 DIAGNOSIS — M0579 Rheumatoid arthritis with rheumatoid factor of multiple sites without organ or systems involvement: Secondary | ICD-10-CM

## 2016-01-22 DIAGNOSIS — M069 Rheumatoid arthritis, unspecified: Secondary | ICD-10-CM

## 2016-01-22 HISTORY — DX: Rheumatoid arthritis, unspecified: M06.9

## 2016-01-22 MED ORDER — HYDROCODONE-ACETAMINOPHEN 10-325 MG PO TABS: 1.0000 | ORAL_TABLET | Freq: Four times a day (QID) | ORAL | Status: DC | PRN

## 2016-01-22 NOTE — Addendum Note (Signed)
Addended by: Laurey Arrow on: 01/22/2016 10:51 AM   Modules accepted: Orders

## 2016-01-22 NOTE — Assessment & Plan Note (Signed)
Will set up again for facet injection.  Last MRI performed in 2015 showing multilevel severe facet stenosis with arthropathy.

## 2016-01-22 NOTE — Progress Notes (Signed)
  DESTYN BLAISDELL - 65 y.o. male MRN [id number]  Date of birth: 19-Jul-1951 HERCHEL MCNITT is a 65 y.o. male who presents today for neck pain with numbness.  Neck pain, f/u visit 01/22/16 - patient recently has been having worsening radiculopathy going down both arms. Denies any new injury but does have neck pain as well. No fevers chills or sweats. Previously has had epidurals which afforded well the past and is interested in another one. Last MRI was in 2013. Denies grip strength weakness or worst headache of life.  PMHx - Updated and reviewed.  Contributory factors include: Recently diagnosed with rheumatoid arthritis PSHx - Updated and reviewed.  Contributory factors include:  CMC replacement bilateral FHx - Updated and reviewed.  Contributory factors include:  Negative Social Hx - Updated and reviewed. Contributory factors include: Former smoker  Medications - start on methotrexate   ROS Per HPI.  12 point negative other than per HPI.   Exam:  Filed Vitals:   01/22/16 1005  BP: 104/71   Gen: NAD, AAO 3 Cardio- RRR Pulm - Normal respiratory effort/rate Skin: No rashes or erythema Extremities: No edema  Vascular: pulses +2 bilateral upper and lower extremity Psych: Normal affect  Neck:  + spurling's B/L  Full neck range of motion Grip strength and sensation normal in bilateral hands Strength good C4 to T1 distribution No sensory change to C4 to T1 Reflexes normal   Imaging:  MRI 2015 Neck showing multilevel facet degeneration with stenosis of lateral and foraminal.

## 2016-01-27 ENCOUNTER — Other Ambulatory Visit: Payer: Self-pay | Admitting: Sports Medicine

## 2016-02-02 ENCOUNTER — Ambulatory Visit
Admission: RE | Admit: 2016-02-02 | Discharge: 2016-02-02 | Disposition: A | Discharge status: Home / Self Care | Payer: BLUE CROSS/BLUE SHIELD | Source: Ambulatory Visit | Attending: Sports Medicine | Admitting: Sports Medicine

## 2016-02-02 ENCOUNTER — Other Ambulatory Visit: Payer: Self-pay | Admitting: Sports Medicine

## 2016-02-02 DIAGNOSIS — M5412 Radiculopathy, cervical region: Secondary | ICD-10-CM

## 2016-02-02 MED ORDER — TRIAMCINOLONE ACETONIDE 40 MG/ML IJ SUSP (RADIOLOGY): 60.0000 mg | Freq: Once | INTRAMUSCULAR | Status: DC

## 2016-02-02 MED ORDER — IOHEXOL 300 MG/ML  SOLN: 1.0000 mL | Freq: Once | INTRAMUSCULAR | Status: DC | PRN

## 2016-02-02 NOTE — Addendum Note (Signed)
Addended by: Cyd Silence on: 02/02/2016 03:52 PM   Modules accepted: Orders

## 2016-02-02 NOTE — Discharge Instructions (Signed)

## 2016-02-05 ENCOUNTER — Telehealth: Payer: Self-pay | Admitting: Radiology

## 2016-02-05 NOTE — Telephone Encounter (Signed)
Pt called c/o very sore area on right side of spine at the injection level. States L sided pain is gone but the right is very tender and sore. States the R side has been starting to bother him as well as the L. Will have Dr. Jola Baptist call him on Friday to discuss his concerns.

## 2016-02-27 ENCOUNTER — Other Ambulatory Visit: Payer: Self-pay | Admitting: *Deleted

## 2016-02-27 MED ORDER — TRAMADOL HCL 50 MG PO TABS: 50.0000 mg | ORAL_TABLET | Freq: Three times a day (TID) | ORAL | Status: DC | PRN

## 2016-05-05 ENCOUNTER — Other Ambulatory Visit: Payer: Self-pay | Admitting: Sports Medicine

## 2016-05-07 ENCOUNTER — Other Ambulatory Visit: Payer: Self-pay | Admitting: Sports Medicine

## 2016-05-11 ENCOUNTER — Ambulatory Visit (INDEPENDENT_AMBULATORY_CARE_PROVIDER_SITE_OTHER): Payer: BLUE CROSS/BLUE SHIELD | Admitting: Sports Medicine

## 2016-05-11 ENCOUNTER — Encounter: Payer: Self-pay | Admitting: Sports Medicine

## 2016-05-11 VITALS — BP 108/68 | Ht 75.0 in | Wt 212.0 lb

## 2016-05-11 DIAGNOSIS — M0579 Rheumatoid arthritis with rheumatoid factor of multiple sites without organ or systems involvement: Secondary | ICD-10-CM | POA: Diagnosis not present

## 2016-05-11 NOTE — Progress Notes (Signed)
   Subjective:    Patient ID: Isaiah Price, male    DOB: 12-27-1950, 65 y.o.   MRN: [id number]  HPI   Patient comes in today for follow-up. He has resumed his methotrexate and overall is feeling better. He has had both facet injections and epidural steroid injections in the past. Facet injections have helped with neck pain and epidural injections have helped with radiculopathy. Last set of facet injections were in April 2016 and his last epidural steroid injection was in April of this year. Today his symptoms are tolerable. In addition to methotrexate he also takes tramadol.    Review of Systems     Objective:   Physical Exam  Well-developed, well-nourished. No acute distress  Cervical spine demonstrate limited cervical range of motion in all planes. Slight tenderness to palpation over the left C3-C4 facets. No spasm. Neurological exam shows no gross neurological deficits of either upper extremity.      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 He will follow-up with rheumatology per their discretion. His cervical spine pain is stable. We could consider repeat epidural steroid injections or facet injections down the road if needed. I refilled his tramadol. Follow-up as needed.

## 2016-06-14 ENCOUNTER — Other Ambulatory Visit: Payer: Self-pay | Admitting: Sports Medicine

## 2016-06-14 ENCOUNTER — Other Ambulatory Visit: Payer: Self-pay | Admitting: *Deleted

## 2016-06-14 NOTE — Telephone Encounter (Signed)
Is this ok to refill?  

## 2016-08-02 ENCOUNTER — Other Ambulatory Visit: Payer: Self-pay | Admitting: *Deleted

## 2016-08-02 MED ORDER — TRAMADOL HCL 50 MG PO TABS: 50.0000 mg | ORAL_TABLET | Freq: Three times a day (TID) | ORAL | 1 refills | Status: DC | PRN

## 2016-09-29 ENCOUNTER — Ambulatory Visit (INDEPENDENT_AMBULATORY_CARE_PROVIDER_SITE_OTHER): Payer: BLUE CROSS/BLUE SHIELD | Admitting: Sports Medicine

## 2016-09-29 ENCOUNTER — Encounter: Payer: Self-pay | Admitting: Sports Medicine

## 2016-09-29 VITALS — BP 133/73 | Ht 75.0 in | Wt 210.0 lb

## 2016-09-29 DIAGNOSIS — M0579 Rheumatoid arthritis with rheumatoid factor of multiple sites without organ or systems involvement: Secondary | ICD-10-CM

## 2016-09-29 MED ORDER — HYDROCODONE-ACETAMINOPHEN 10-325 MG PO TABS: 1.0000 | ORAL_TABLET | Freq: Four times a day (QID) | ORAL | 0 refills | Status: DC | PRN

## 2016-09-29 MED ORDER — PREDNISONE 10 MG PO TABS: 10.0000 mg | ORAL_TABLET | Freq: Every day | ORAL | 0 refills | Status: DC

## 2016-09-29 NOTE — Progress Notes (Signed)
Patient ID: Isaiah Price, male   DOB: [birth year], 65 y.o.   MRN: [id number]   Patient comes in today for a quick follow-up and medication refill. He is feeling great. He is under the care of rheumatology for his seropositive rheumatoid arthritis which affects multiple areas of his body. He was unable to tolerate methotrexate so he was recently started on Humira which has been very helpful. He still needs occasional prednisone and, rarely, hydrocodone. He is requesting refills on both of these medicines. Follow-up as needed.  Total time spent with the patient was 10 minutes with greater than 50% of the time spent in face-to-face consultation.

## 2016-10-13 ENCOUNTER — Other Ambulatory Visit: Payer: Self-pay | Admitting: Sports Medicine

## 2016-10-19 ENCOUNTER — Other Ambulatory Visit: Payer: Self-pay | Admitting: *Deleted

## 2016-10-19 NOTE — Telephone Encounter (Signed)
Is this ok to refill?  

## 2016-11-18 DIAGNOSIS — R198 Other specified symptoms and signs involving the digestive system and abdomen: Secondary | ICD-10-CM | POA: Insufficient documentation

## 2016-11-18 DIAGNOSIS — S76219A Strain of adductor muscle, fascia and tendon of unspecified thigh, initial encounter: Secondary | ICD-10-CM | POA: Insufficient documentation

## 2016-12-06 ENCOUNTER — Other Ambulatory Visit: Payer: Self-pay | Admitting: Sports Medicine

## 2017-01-12 DIAGNOSIS — I10 Essential (primary) hypertension: Secondary | ICD-10-CM | POA: Insufficient documentation

## 2017-02-28 ENCOUNTER — Other Ambulatory Visit: Payer: Self-pay | Admitting: *Deleted

## 2017-02-28 MED ORDER — TRAMADOL HCL 50 MG PO TABS: 50.0000 mg | ORAL_TABLET | Freq: Three times a day (TID) | ORAL | 1 refills | Status: DC | PRN

## 2017-03-07 ENCOUNTER — Encounter: Payer: Self-pay | Admitting: Sports Medicine

## 2017-03-07 ENCOUNTER — Ambulatory Visit (INDEPENDENT_AMBULATORY_CARE_PROVIDER_SITE_OTHER): Payer: BLUE CROSS/BLUE SHIELD | Admitting: Sports Medicine

## 2017-03-07 ENCOUNTER — Ambulatory Visit
Admission: RE | Admit: 2017-03-07 | Discharge: 2017-03-07 | Disposition: A | Discharge status: Home / Self Care | Payer: BLUE CROSS/BLUE SHIELD | Source: Ambulatory Visit | Attending: Sports Medicine | Admitting: Sports Medicine

## 2017-03-07 VITALS — BP 128/80 | Ht 76.0 in | Wt 215.0 lb

## 2017-03-07 DIAGNOSIS — M4802 Spinal stenosis, cervical region: Secondary | ICD-10-CM | POA: Diagnosis not present

## 2017-03-07 MED ORDER — HYDROCODONE-ACETAMINOPHEN 10-325 MG PO TABS: 1.0000 | ORAL_TABLET | Freq: Four times a day (QID) | ORAL | 0 refills | Status: DC | PRN

## 2017-03-07 NOTE — Progress Notes (Signed)
Subjective: CC: neck pain and L achilles pain HPI: Patient is a 66 y.o. male with a past medical history of RA presenting to clinic today for a f/u on neck pain.  Neck pain: He is currently on Humira. He has prednisone, but does not take it regularly. He has had both facet injections and epidural steroid injections in the past, the last around 12 months ago per his report. Facet injections have helped with neck pain and epidural injections have helped with radiculopathy. Last set of facet injections were in April 2017 and his last epidural steroid injection was in April 2017. Patient states that he is beginning to have pain in the neck, left-sided, for the last several months. It feels like a stabbing pain. He notes radiation to the ears intermittently.  Pain is 5 out of 10. Pain is worsened by movement He takes tramadol intermittently. Denies any radicular symptoms, but does note that intermittently, he has tingling sensation of his first 3 digits bilaterally. No weakness. Sometimes the neck pain can cause headaches. He asked for refill on hydrocodone, his last refill of this was December 2017.    Left heel pain:  Patient states this has been going on since the fall 2017. States it's worsened after prolonged sitting. Pain feels tight and stiff. He is worried about weightbearing initially after standing because of concerns that he may rupture his tendon. He discusses rheumatologist, who you prescribed Pensaid topically. He states he has not had any benefit from this. He has not tried any other interventions.  Social History: former smoker   ROS: All other systems reviewed and are negative.  Past Medical History Patient Active Problem List   Diagnosis Date Noted  . Rheumatoid arthritis ([place]) 01/22/2016  . Wrist pain, right 12/01/2015  . IT band syndrome 05/06/2014  . Cervical radiculopathy 07/14/2011  . Arthritis pain, hand 06/02/2011  . 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 diltiazem (CARDIZEM) 90 MG tablet Take 1 tablet daily for blood pressure control    . omeprazole (PRILOSEC) 20 MG capsule Take one tablet every day to control acid reflux symptoms.    . simvastatin (ZOCOR) 40 MG tablet Take one tablet daily to control cholesterol.    . zolpidem (AMBIEN) 10 MG tablet Take one tablet at bedtime for insomnia.    Marland Kitchen alfuzosin (UROXATRAL) 10 MG 24 hr tablet   10  . amoxicillin (AMOXIL) 500 MG capsule   0  . amoxicillin-clavulanate (AUGMENTIN) 875-125 MG tablet   0  . carboxymethylcellulose (REFRESH PLUS) 0.5 % SOLN Apply to eye.    . carboxymethylcellulose (REFRESH TEARS) 0.5 % SOLN Apply to eye.    . cycloSPORINE (RESTASIS) 0.05 % ophthalmic emulsion 1 drop 2 times daily.    Marland Kitchen diltiazem (CARDIZEM) 90 MG tablet Take 90 mg by mouth 2 (two) times daily.     Marland Kitchen diltiazem (CARDIZEM) 90 MG tablet Take 1 tablet daily    . dorzolamide-timolol (COSOPT) 22.3-6.8 MG/ML ophthalmic solution   0  . famotidine (PEPCID) 20 MG tablet Take 20 mg by mouth.    . fluticasone (FLONASE) 50 MCG/ACT nasal spray Place into the nose.    . folic acid (FOLVITE) 1 MG tablet Take 1 mg by mouth.    Marland Kitchen GAVILYTE-N WITH FLAVOR PACK 420 g solution   0  . HUMIRA PEN 40 MG/0.8ML PNKT   1  .  HYDROcodone-acetaminophen (NORCO) 10-325 MG tablet Take 1 tablet by mouth every 6 (six) hours as needed. 20 tablet 0  . methocarbamol (ROBAXIN) 500 MG tablet   0  . methotrexate (RHEUMATREX) 2.5 MG tablet Take 2.5 mg by mouth.    . methylphenidate (RITALIN) 10 MG tablet   0  . methylPREDNIsolone (MEDROL DOSPACK) 4 MG tablet     . oxyCODONE (OXY IR/ROXICODONE) 5 MG immediate release tablet     . PENNSAID 2 % SOLN APPLY 2 PUMPS (2 GRAMS) TO AFFECTED AREA TOPICALLY TWICE DAILY AS DIRECTED  1  . prednisoLONE acetate (PRED FORTE) 1 % ophthalmic suspension     . predniSONE (DELTASONE) 10 MG tablet Take 1 tablet  (10 mg total) by mouth daily with breakfast. 48 tablet 0  . PROAIR RESPICLICK 423 (90 Base) MCG/ACT AEPB   0  . RESTASIS 0.05 % ophthalmic emulsion   2  . simvastatin (ZOCOR) 40 MG tablet Take 40 mg by mouth.    . tamsulosin (FLOMAX) 0.4 MG CAPS capsule Take by mouth.    . traMADol (ULTRAM) 50 MG tablet Take 1 tablet (50 mg total) by mouth every 8 (eight) hours as needed. 60 tablet 1  . zolpidem (AMBIEN) 10 MG tablet Take 10 mg by mouth.     No current facility-administered medications for this visit.     Objective: Office vital signs reviewed. BP 128/80   Ht 6\' 4"  (1.93 m)   Wt 215 lb (97.5 kg)   BMI 26.17 kg/m    Physical Examination:  General: Awake, alert, well- nourished, NAD  Neck: normal to inspection, tenderness just lateral to the spinous prcesses on the left over C3-C4 facets. Decreased ROM of the cervical spine. No spasm noted. 5/5 strength in the UEs bilaterally. Symmetric DTRs in the UE. Neurovascularly intact distally. Negative Spurlings.   Feet: moderate longitudinal arch with slight collapse with weight bearing.  Left Achilles normal to inspection. Tenderness to palpation at the proximal insertion site. Full range of motion. 5 out of 5 strength.    Assessment/Plan: This is a 66 year old male with past medical history of rheumatoid arthritis presenting for recurrence of cervical neck pain in addition to Achilles pain.  1. Cervical degenerative disc disease with facet arthropathy: History and exam consistent with facet arthropathy, no radicular symptoms currently. Patient seemed to have gotten a significant benefit from epidural and facet injections in the past. Is been quite sometimes since he's had imaging of his cervical spine.  - Will repeat cervical spine x-rays and MRI today.  - Patient to start back on his prednisone, he is advised to discuss this with his rheumatologist.  - Continue with Humira per his rheumatologist.  - Pending the results of imaging and the  patient's benefits from prednisone, could consider going back to Dr. Jola Baptist for another injection.  - Will refill hydrocodone.  2. Achilles tendinopathy: Patient's symptoms consistent with tendinopathy. No evidence of tear. He has full range of motion. He is unable to tolerate by mouth NSAIDs due to GI upset.  - Discussed continuing topical NSAID as needed.  - Provided eccentric exercises for home.  - Provided heel lifts.  Pt to f/u in 6 weeks or sooner as needed.  Orders Placed This Encounter  Procedures  . DG Cervical Spine Complete    Standing Status:   Future    Standing Expiration Date:   05/07/2018    Order Specific Question:   Reason for Exam (SYMPTOM  OR DIAGNOSIS REQUIRED)  Answer:   SPINAL STENOSIS    Order Specific Question:   Preferred imaging location?    Answer:   [facility]    Order Specific Question:   Radiology Contrast Protocol - do NOT remove file path    Answer:   \\[email or web address].pdf  . MR CERVICAL SPINE WO CONTRAST    Standing Status:   Future    Standing Expiration Date:   05/07/2018    Order Specific Question:   Reason for Exam (SYMPTOM  OR DIAGNOSIS REQUIRED)    Answer:   SPINAL STENOSIS    Order Specific Question:   What is the patient's sedation requirement?    Answer:   No Sedation    Order Specific Question:   Does the patient have a pacemaker or implanted devices?    Answer:   No    Order Specific Question:   Preferred imaging location?    Answer:   [facility] (table limit-550lbs)    Order Specific Question:   Radiology Contrast Protocol - do NOT remove file path    Answer:   \\[email or web address]    Archie Patten PGY-3, [facility]  Patient seen and evaluated with the resident. I agree with the above plan of care. We are going to start getting some updated imaging of his cervical spine. He has a known history of cervical spinal stenosis and facet arthropathy.  We may refer him back to Dr. Jola Baptist for repeat injections after those studies are complete. In the meantime, he does have a 6 day Sterapred Dosepak but he will notify his rheumatologist as well. I also refilled his hydrocodone (20 pills with no additional refills). For his Achilles tendon pain he will start daily eccentric exercises and we will use 5/16 inch heel lifts until his pain improves.

## 2017-03-12 ENCOUNTER — Ambulatory Visit
Admission: RE | Admit: 2017-03-12 | Discharge: 2017-03-12 | Disposition: A | Discharge status: Home / Self Care | Payer: BLUE CROSS/BLUE SHIELD | Source: Ambulatory Visit | Attending: Sports Medicine | Admitting: Sports Medicine

## 2017-03-12 DIAGNOSIS — M4802 Spinal stenosis, cervical region: Secondary | ICD-10-CM

## 2017-03-16 ENCOUNTER — Telehealth: Payer: Self-pay | Admitting: Sports Medicine

## 2017-03-16 ENCOUNTER — Other Ambulatory Visit: Payer: Self-pay | Admitting: Sports Medicine

## 2017-03-16 DIAGNOSIS — M4802 Spinal stenosis, cervical region: Secondary | ICD-10-CM

## 2017-03-16 DIAGNOSIS — M5412 Radiculopathy, cervical region: Secondary | ICD-10-CM

## 2017-03-16 NOTE — Addendum Note (Signed)
Addended by: Cyd Silence on: 03/16/2017 05:09 PM   Modules accepted: Orders

## 2017-03-16 NOTE — Telephone Encounter (Signed)
Notes faxed to Dr Carrie Mew office for them to call patient with appt

## 2017-03-16 NOTE — Telephone Encounter (Signed)
I spoke with Isaiah Price on the phone today after reviewing the MRI of his cervical spine. He has severe right-sided foraminal narrowing at C4-C5 and C5-C6. His previous C5-C6 arthrodesis is stable. He's feeling better after his steroid taper. I recommended that the patient return to Dr. Sherwood Gambler for his input. Isaiah Price has had multiple epidural and facet injections and I would like Dr. Donnella Bi input regarding whether or not we can continue with these indefinitely or whether Isaiah Price should consider surgery. Isaiah Price is also concerned about some carotid artery calcification seen on his x-ray. I recommended that he touch base with his primary care physician to discuss further workup and treatment if needed.

## 2017-03-24 ENCOUNTER — Encounter: Payer: Self-pay | Admitting: Sports Medicine

## 2017-04-12 ENCOUNTER — Other Ambulatory Visit: Payer: Self-pay | Admitting: Orthopedic Surgery

## 2017-05-12 ENCOUNTER — Other Ambulatory Visit: Payer: Self-pay | Admitting: Sports Medicine

## 2017-06-01 ENCOUNTER — Other Ambulatory Visit: Payer: Self-pay | Admitting: Sports Medicine

## 2017-06-01 ENCOUNTER — Encounter: Payer: Self-pay | Admitting: Sports Medicine

## 2017-06-01 ENCOUNTER — Other Ambulatory Visit: Payer: Self-pay

## 2017-06-01 MED ORDER — HYDROCODONE-ACETAMINOPHEN 10-325 MG PO TABS: 1.0000 | ORAL_TABLET | Freq: Four times a day (QID) | ORAL | 0 refills | Status: DC | PRN

## 2017-06-02 ENCOUNTER — Other Ambulatory Visit: Payer: Self-pay

## 2017-06-02 MED ORDER — HYDROCODONE-ACETAMINOPHEN 10-325 MG PO TABS: 1.0000 | ORAL_TABLET | Freq: Four times a day (QID) | ORAL | 0 refills | Status: DC | PRN

## 2017-06-21 ENCOUNTER — Ambulatory Visit (INDEPENDENT_AMBULATORY_CARE_PROVIDER_SITE_OTHER): Payer: BLUE CROSS/BLUE SHIELD | Admitting: Sports Medicine

## 2017-06-21 ENCOUNTER — Encounter: Payer: Self-pay | Admitting: Sports Medicine

## 2017-06-21 VITALS — BP 129/80 | Ht 76.0 in | Wt 220.0 lb

## 2017-06-21 DIAGNOSIS — M4802 Spinal stenosis, cervical region: Secondary | ICD-10-CM

## 2017-06-22 NOTE — Progress Notes (Signed)
   Subjective:    Patient ID: Isaiah Price, male    DOB: 1951/10/10, 66 y.o.   MRN: [id number]  HPI chief complaint: Neck pain  Patient comes in today with returning neck pain. He has a history of extensive multilevel spondylosis and degenerative disc disease in the cervical spine. He recently saw Dr. Sherwood Gambler back in June. Dr. Sherwood Gambler recommended continuing with conservative treatment including repeat facet injections if needed. At the time of his office visit with Dr. Sherwood Gambler the patient was feeling pretty good. But since then he has had to discontinue his Humira due to repeated sinus infections. He has recently restarted his Humira but he would like to go ahead and proceed with repeat cervical spine injections. Pain is primarily on the right side of his neck. Worse with rotation. No radiating pain into his arms. No numbness or tingling. He has not noticed any weakness. He is taking an occasional hydrocodone for pain.  Interim medical history is reviewed Interim surgical history is reviewed. It is significant for surgery for the right hand for Dupuytren's contracture. He is doing well postoperatively. Current medications reviewed Allergies are reviewed    Review of Systems    as above Objective:   Physical Exam  Well-developed, well-nourished. No acute distress  Cervical spine: Patient has slight tenderness to palpation diffusely along the right paraspinal musculature. No tenderness to palpation along cervical midline. He has limited cervical rotation in all planes especially with cervical rotation to the left. No spasm.  Neurological exam shows good strength in both upper extremities. No atrophy. Reflexes are 2+ at the triceps and biceps reflexes bilaterally. 2+ at the brachial radialis reflex on the left but absent on the right. This is likely residual from his previous cervical spine surgery.      Assessment & Plan:   Returning neck pain secondary to multilevel spondylosis and  cervical degenerative disc disease Rheumatoid arthritis Status post previous cervical spine fusion  Patient will be referred back over to Dr.Curnes at [place] for repeat facet injections. I'll defer the level of injection to Dr. Jola Baptist. If symptoms become more intolerable then the patient may need to return to Dr. Sherwood Gambler. Hopefully his symptoms will improve while restarting Humira. Follow-up as needed.

## 2017-06-23 ENCOUNTER — Other Ambulatory Visit: Payer: Self-pay | Admitting: Sports Medicine

## 2017-06-23 DIAGNOSIS — M4802 Spinal stenosis, cervical region: Secondary | ICD-10-CM

## 2017-07-01 DIAGNOSIS — Z23 Encounter for immunization: Secondary | ICD-10-CM | POA: Insufficient documentation

## 2017-07-01 DIAGNOSIS — G5621 Lesion of ulnar nerve, right upper limb: Secondary | ICD-10-CM | POA: Insufficient documentation

## 2017-07-05 ENCOUNTER — Ambulatory Visit
Admission: RE | Admit: 2017-07-05 | Discharge: 2017-07-05 | Disposition: A | Discharge status: Home / Self Care | Payer: BLUE CROSS/BLUE SHIELD | Source: Ambulatory Visit | Attending: Sports Medicine | Admitting: Sports Medicine

## 2017-07-05 DIAGNOSIS — M4802 Spinal stenosis, cervical region: Secondary | ICD-10-CM

## 2017-07-05 MED ORDER — IOPAMIDOL (ISOVUE-M 300) INJECTION 61%
1.0000 mL | Freq: Once | INTRAMUSCULAR | Status: AC | PRN
  Administered 2017-07-05: 1 mL via EPIDURAL

## 2017-07-05 MED ORDER — TRIAMCINOLONE ACETONIDE 40 MG/ML IJ SUSP (RADIOLOGY)
60.0000 mg | Freq: Once | INTRAMUSCULAR | Status: AC
  Administered 2017-07-05: 60 mg via EPIDURAL

## 2017-07-05 NOTE — Discharge Instructions (Signed)

## 2017-07-06 ENCOUNTER — Other Ambulatory Visit: Payer: Self-pay

## 2017-07-06 ENCOUNTER — Other Ambulatory Visit: Payer: Self-pay | Admitting: Sports Medicine

## 2017-07-06 MED ORDER — TRAMADOL HCL 50 MG PO TABS: 50.0000 mg | ORAL_TABLET | Freq: Three times a day (TID) | ORAL | 0 refills | Status: DC | PRN

## 2017-08-04 DIAGNOSIS — R6889 Other general symptoms and signs: Secondary | ICD-10-CM | POA: Insufficient documentation

## 2017-08-04 DIAGNOSIS — R400 Somnolence: Secondary | ICD-10-CM | POA: Insufficient documentation

## 2017-08-04 DIAGNOSIS — R0609 Other forms of dyspnea: Secondary | ICD-10-CM

## 2017-08-24 ENCOUNTER — Other Ambulatory Visit: Payer: Self-pay | Admitting: Sports Medicine

## 2017-08-24 ENCOUNTER — Encounter: Payer: Self-pay | Admitting: *Deleted

## 2017-08-24 ENCOUNTER — Other Ambulatory Visit: Payer: Self-pay | Admitting: *Deleted

## 2017-08-24 NOTE — Progress Notes (Signed)
Cardiology Office Note   Date:  08/25/2017   ID:  Isaiah Price, DOB 11/04/1950, MRN [id number]  PCP:  [name], MD  Cardiologist:   Minus Breeding, MD  Referring:  [name], MD  Chief Complaint  Patient presents with  . Shortness of Breath      History of Present Illness: Isaiah Price is a 66 y.o. male who is referrec by [name], MD for evaluation of decreased exercise tolerance and dyspnea.  He says that about 6 weeks ago he started noticing the pushing the lawnmower which is self-propelled would make him more fatigued.  He had to rest more.  His wife noted that he was breathing heavier although he did not really notice this.  He is not had any chest pressure, neck or arm discomfort.  He has had occasional palpitations that have not increased in frequency or intensity.  He does not have any resting shortness of breath, PND or orthopnea.  He has had some mild weight gain because he is on steroids.  He has had no severe edema.  He had no prior cardiac workup other than the age 58 he started to have some cardiac enlargement but this was ultimately found to be related to pectus excavatum.  He says he had an echo and other stuff in [place].  At the might of been mitral valve prolapse.  He has had pericarditis in the past.   Past Medical History:  Diagnosis Date  . Cervical radiculopathy 07/14/2011   Suspect this is related to degenerative disc disease of the cervical spine and a history of a failed fusion. Clinically he has significant involvement of C5/C6  . Chronic insomnia 12/31/2015  . Chronic pain 12/31/2015   Neck  . Esophageal reflux 12/31/2015  . HTN (hypertension)   . Hypercholesteremia 12/31/2015  . IT band syndrome 05/06/2014  . Osteoarthritis of [place] joint of thumb 06/02/2011   There is extensive osteoarthritis in both hands with the right more symptomatic but the left showing greater change on ultrasound.   Marland Kitchen PIN (prostatic intraepithelial neoplasia)   .  Rheumatoid arthritis ([place]) 01/22/2016  . 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 CARPOMETACARPAL (CMC) FUSION OF THUMB    . eye lens surgery Bilateral 2013   CE/PCIOL monovision  . NECK SURGERY    . SHOULDER SURGERY    . TONSILLECTOMY    . VASECTOMY       Current Outpatient Medications  Medication Sig Dispense Refill  . alfuzosin (UROXATRAL) 10 MG 24 hr tablet   10  . amoxicillin (AMOXIL) 500 MG capsule   0  . amoxicillin-clavulanate (AUGMENTIN) 875-125 MG tablet   0  . carboxymethylcellulose (REFRESH PLUS) 0.5 % SOLN Apply to eye.    . cycloSPORINE (RESTASIS) 0.05 % ophthalmic emulsion 1 drop 2 times daily.    Marland Kitchen diltiazem (CARDIZEM) 90 MG tablet Take 1 tablet daily    . dorzolamide-timolol (COSOPT) 22.3-6.8 MG/ML ophthalmic solution   0  . famotidine (PEPCID) 20 MG tablet Take 20 mg by mouth.    . fluticasone (FLONASE) 50 MCG/ACT nasal spray Place into the nose.    . folic acid (FOLVITE) 1 MG tablet Take 1 mg by mouth.    Marland Kitchen GAVILYTE-N WITH FLAVOR PACK 420 g solution   0  . HUMIRA PEN 40 MG/0.8ML PNKT Inject as directed. Every other week  1  . HYDROcodone-acetaminophen ([place])  10-325 MG tablet Take 1 tablet by mouth every 6 (six) hours as needed. 20 tablet 0  . methocarbamol (ROBAXIN) 500 MG tablet   0  . methotrexate (RHEUMATREX) 2.5 MG tablet Take 2.5 mg by mouth.    . methylphenidate (RITALIN) 10 MG tablet   0  . methylPREDNIsolone (MEDROL DOSPACK) 4 MG tablet     . omeprazole (PRILOSEC) 20 MG capsule Take one tablet every day to control acid reflux symptoms.    Marland Kitchen oxyCODONE (OXY IR/ROXICODONE) 5 MG immediate release tablet     . PENNSAID 2 % SOLN APPLY 2 PUMPS (2 GRAMS) TO AFFECTED AREA TOPICALLY TWICE DAILY AS DIRECTED  1  . prednisoLONE acetate (PRED FORTE) 1 % ophthalmic suspension     . predniSONE (DELTASONE) 10 MG tablet TAKE 1 TABLET EVERY DAY WITH BREAKFAST. 48 tablet 0  . PROAIR RESPICLICK 932 (90 Base)  MCG/ACT AEPB   0  . simvastatin (ZOCOR) 40 MG tablet Take 40 mg by mouth.    . tamsulosin (FLOMAX) 0.4 MG CAPS capsule Take by mouth.    . traMADol (ULTRAM) 50 MG tablet TAKE (1) TABLET EVERY EIGHT HOURS AS NEEDED FOR PAIN. 60 tablet 0  . zolpidem (AMBIEN) 10 MG tablet Take one tablet at bedtime for insomnia.     No current facility-administered medications for this visit.     Allergies:   Nsaids; Brimonidine; Gabapentin; and Meloxicam    Social History:  The patient  reports that he quit smoking about 5 years ago. His smoking use included cigarettes. He has a 30.00 pack-year smoking history. he has never used smokeless tobacco. He reports that he drinks alcohol. He reports that he does not use drugs.   Family History:  The patient's family history includes Heart disease in his father and mother; Hypertension in his father; Macular degeneration in his mother.    ROS:  Please see the history of present illness.   Otherwise, review of systems are positive for insomnia.   All other systems are reviewed and negative.    PHYSICAL EXAM: VS:  BP 96/78   Pulse 92   Ht 6\' 4"  (1.93 m)   Wt 213 lb 12.8 oz (97 kg)   SpO2 95%   BMI 26.02 kg/m  , BMI Body mass index is 26.02 kg/m. GENERAL:  Well appearing HEENT:  Pupils equal round and reactive, fundi not visualized, oral mucosa unremarkable NECK:  No jugular venous distention, waveform within normal limits, carotid upstroke brisk and symmetric, no bruits, no thyromegaly LYMPHATICS:  No cervical, inguinal adenopathy LUNGS:  Clear to auscultation bilaterally BACK:  No CVA tenderness CHEST:  Pectus excavatum HEART:  PMI not displaced or sustained,S1 and S2 within normal limits, no S3, no S4, no clicks, no rubs, no murmurs ABD:  Flat, positive bowel sounds normal in frequency in pitch, no bruits, no rebound, no guarding, no midline pulsatile mass, no hepatomegaly, no splenomegaly EXT:  2 plus pulses throughout, no edema, no cyanosis no  clubbing SKIN:  No rashes no nodules NEURO:  Cranial nerves II through XII grossly intact, motor grossly intact throughout PSYCH:  Cognitively intact, oriented to person place and time    EKG:  EKG is ordered today. The ekg ordered today demonstrates sinus rhythm, rate 92, left axis deviation, possible ectopic atrial rhythm, poor anterior R wave progression, no acute ST-T wave changes.   Recent Labs: No results found for requested labs within last 8760 hours.    Lipid Panel No results found for:  CHOL, TRIG, HDL, CHOLHDL, VLDL, LDLCALC, LDLDIRECT    Wt Readings from Last 3 Encounters:  08/25/17 213 lb 12.8 oz (97 kg)  06/21/17 220 lb (99.8 kg)  03/07/17 215 lb (97.5 kg)      Other studies Reviewed: Additional studies/ records that were reviewed today include: Office records. Review of the above records demonstrates:  Please see elsewhere in the note.     ASSESSMENT AND PLAN:  DECREASED EXERCISE TOLERANCE:   I am going to start with a POET (Plain Old Exercise Treadmill).  He says he actually thinks he is getting better and if this is normal as well as the blood work below and his symptoms improve I might not suggest further testing.  DYSPNEA: His wife reports this more than he does.  I will start with a BNP level and testing as above.ABNORMAL    EKG:    I suspect this might be related to his pectus.  However, I will have a low threshold for an echocardiogram pending the results above.  His exam would not suggest any constrictive physiology related to his previous pericarditis which was years ago.  Current medicines are reviewed at length with the patient today.  The patient does not have concerns regarding medicines.  The following changes have been made:  no change  Labs/ tests ordered today include:   Orders Placed This Encounter  Procedures  . B Nat Peptide  . Exercise Tolerance Test  . EKG 12-Lead     Disposition:   FU with me as needed.     Signed, Minus Breeding, MD  08/25/2017 10:57 AM    [place]

## 2017-08-25 ENCOUNTER — Ambulatory Visit (INDEPENDENT_AMBULATORY_CARE_PROVIDER_SITE_OTHER): Payer: Medicare Other | Admitting: Cardiology

## 2017-08-25 ENCOUNTER — Encounter: Payer: Self-pay | Admitting: Cardiology

## 2017-08-25 VITALS — BP 96/78 | HR 92 | Ht 76.0 in | Wt 213.8 lb

## 2017-08-25 DIAGNOSIS — R9431 Abnormal electrocardiogram [ECG] [EKG]: Secondary | ICD-10-CM

## 2017-08-25 DIAGNOSIS — R6889 Other general symptoms and signs: Secondary | ICD-10-CM | POA: Diagnosis not present

## 2017-08-25 DIAGNOSIS — R0602 Shortness of breath: Secondary | ICD-10-CM

## 2017-08-25 NOTE — Patient Instructions (Addendum)
Medication Instructions:  Continue current medications  If you need a refill on your cardiac medications before your next appointment, please call your pharmacy.  Labwork: BNP Today HERE IN OUR OFFICE AT [facility]  Testing/Procedures: Your physician has requested that you have an exercise tolerance test. For further information please visit [email or web address]. Please also follow instruction sheet, as given.   Follow-Up: Your physician wants you to follow-up in: As Needed.    HAPPY BIRTHDAY!!!   Thank you for choosing [facility] at [facility]!!

## 2017-08-26 LAB — BRAIN NATRIURETIC PEPTIDE: BNP: 17.5 pg/mL (ref 0.0–100.0)

## 2017-08-31 ENCOUNTER — Telehealth (HOSPITAL_COMMUNITY): Payer: Self-pay

## 2017-08-31 DIAGNOSIS — R3912 Poor urinary stream: Secondary | ICD-10-CM | POA: Diagnosis not present

## 2017-08-31 DIAGNOSIS — R972 Elevated prostate specific antigen [PSA]: Secondary | ICD-10-CM | POA: Diagnosis not present

## 2017-08-31 DIAGNOSIS — N401 Enlarged prostate with lower urinary tract symptoms: Secondary | ICD-10-CM | POA: Diagnosis not present

## 2017-08-31 NOTE — Telephone Encounter (Signed)
Encounter complete. 

## 2017-09-02 ENCOUNTER — Ambulatory Visit (HOSPITAL_COMMUNITY)
Admission: RE | Admit: 2017-09-02 | Discharge: 2017-09-02 | Disposition: A | Discharge status: Home / Self Care | Payer: Medicare Other | Source: Ambulatory Visit | Attending: Cardiology | Admitting: Cardiology

## 2017-09-02 DIAGNOSIS — R0602 Shortness of breath: Secondary | ICD-10-CM

## 2017-09-02 LAB — EXERCISE TOLERANCE TEST
CHL CUP MPHR: 155 {beats}/min
CHL RATE OF PERCEIVED EXERTION: 18
CSEPED: 6 min
CSEPHR: 100 %
Estimated workload: 7 METS
Exercise duration (sec): 0 s
Peak HR: 155 {beats}/min
Rest HR: 114 {beats}/min

## 2017-09-06 DIAGNOSIS — R972 Elevated prostate specific antigen [PSA]: Secondary | ICD-10-CM | POA: Diagnosis not present

## 2017-09-13 DIAGNOSIS — Z23 Encounter for immunization: Secondary | ICD-10-CM | POA: Diagnosis not present

## 2017-09-15 ENCOUNTER — Encounter: Payer: Self-pay | Admitting: Sports Medicine

## 2017-09-15 ENCOUNTER — Ambulatory Visit (INDEPENDENT_AMBULATORY_CARE_PROVIDER_SITE_OTHER): Payer: Medicare Other | Admitting: Sports Medicine

## 2017-09-15 VITALS — BP 120/80 | Ht 75.5 in | Wt 212.0 lb

## 2017-09-15 DIAGNOSIS — M4802 Spinal stenosis, cervical region: Secondary | ICD-10-CM | POA: Diagnosis present

## 2017-09-15 MED ORDER — HYDROCODONE-ACETAMINOPHEN 10-325 MG PO TABS: 1.0000 | ORAL_TABLET | Freq: Four times a day (QID) | ORAL | 0 refills | Status: DC | PRN

## 2017-09-15 MED ORDER — PREDNISONE 10 MG PO TABS: ORAL_TABLET | ORAL | 0 refills | Status: DC

## 2017-09-15 NOTE — Progress Notes (Signed)
   Subjective:    Patient ID: Isaiah Price, male    DOB: November 26, 1950, 66 y.o.   MRN: [id number]  HPI chief complaint: Neck pain  Isaiah Price comes in today complaining of neck pain. Pain is a little different than what he has experienced previously. Main complaint is pain at the base of his skull with neck extension. He also gets a "shocking sensation" through his shoulders when this happens. He denies any recent trauma. He did take a little prednisone which did help. Since his last office visit with me he has undergone a single left-sided epidural steroid injection at C7-T1. That did help with his radiculopathy in his left arm. He is also under the care of rheumatology and is planning on starting Remicade in the near future but has to have prostate surgery first.    Review of Systems As above    Objective:   Physical Exam  Well-developed, well-nourished. No acute distress  Cervical spine: Patient's range of motion is limited in all planes. He has reproducible pain with neck extension. Slight tenderness to palpation at the base of the skull but no soft tissue swelling. Neurological exam shows no focal neurological deficit of either upper extremity.      Assessment & Plan:   Advanced cervical degenerative disc disease Rheumatoid arthritis  6 day Sterapred Dosepak to take as directed. Refill of his hydrocodone (#20 with no refills). I recommended that he contact Dr. Sherwood Gambler to see if Dr. Sherwood Gambler recommends repeat imaging. Patient will follow-up with me as needed.

## 2017-09-16 ENCOUNTER — Encounter: Payer: Self-pay | Admitting: Sports Medicine

## 2017-09-28 DIAGNOSIS — H209 Unspecified iridocyclitis: Secondary | ICD-10-CM | POA: Diagnosis not present

## 2017-09-28 DIAGNOSIS — E663 Overweight: Secondary | ICD-10-CM | POA: Diagnosis not present

## 2017-09-28 DIAGNOSIS — Z6827 Body mass index (BMI) 27.0-27.9, adult: Secondary | ICD-10-CM | POA: Diagnosis not present

## 2017-09-28 DIAGNOSIS — M0579 Rheumatoid arthritis with rheumatoid factor of multiple sites without organ or systems involvement: Secondary | ICD-10-CM | POA: Diagnosis not present

## 2017-09-28 DIAGNOSIS — M7662 Achilles tendinitis, left leg: Secondary | ICD-10-CM | POA: Diagnosis not present

## 2017-09-28 DIAGNOSIS — M150 Primary generalized (osteo)arthritis: Secondary | ICD-10-CM | POA: Diagnosis not present

## 2017-09-28 DIAGNOSIS — M2550 Pain in unspecified joint: Secondary | ICD-10-CM | POA: Diagnosis not present

## 2017-09-28 DIAGNOSIS — M5030 Other cervical disc degeneration, unspecified cervical region: Secondary | ICD-10-CM | POA: Diagnosis not present

## 2017-09-30 DIAGNOSIS — R972 Elevated prostate specific antigen [PSA]: Secondary | ICD-10-CM | POA: Diagnosis not present

## 2017-10-31 ENCOUNTER — Other Ambulatory Visit: Payer: Self-pay | Admitting: Urology

## 2017-10-31 DIAGNOSIS — R972 Elevated prostate specific antigen [PSA]: Secondary | ICD-10-CM | POA: Diagnosis not present

## 2017-10-31 DIAGNOSIS — R351 Nocturia: Secondary | ICD-10-CM | POA: Diagnosis not present

## 2017-10-31 DIAGNOSIS — N401 Enlarged prostate with lower urinary tract symptoms: Secondary | ICD-10-CM | POA: Diagnosis not present

## 2017-11-07 DIAGNOSIS — M0579 Rheumatoid arthritis with rheumatoid factor of multiple sites without organ or systems involvement: Secondary | ICD-10-CM | POA: Diagnosis not present

## 2017-11-15 ENCOUNTER — Ambulatory Visit
Admission: RE | Admit: 2017-11-15 | Discharge: 2017-11-15 | Disposition: A | Discharge status: Home / Self Care | Payer: Medicare Other | Source: Ambulatory Visit | Attending: Urology | Admitting: Urology

## 2017-11-15 DIAGNOSIS — R972 Elevated prostate specific antigen [PSA]: Secondary | ICD-10-CM | POA: Diagnosis not present

## 2017-11-15 MED ORDER — GADOBENATE DIMEGLUMINE 529 MG/ML IV SOLN
20.0000 mL | Freq: Once | INTRAVENOUS | Status: AC | PRN
  Administered 2017-11-15: 20 mL via INTRAVENOUS

## 2017-11-21 DIAGNOSIS — M0579 Rheumatoid arthritis with rheumatoid factor of multiple sites without organ or systems involvement: Secondary | ICD-10-CM | POA: Diagnosis not present

## 2017-11-22 DIAGNOSIS — Z808 Family history of malignant neoplasm of other organs or systems: Secondary | ICD-10-CM | POA: Diagnosis not present

## 2017-11-22 DIAGNOSIS — Z23 Encounter for immunization: Secondary | ICD-10-CM | POA: Diagnosis not present

## 2017-11-22 DIAGNOSIS — D225 Melanocytic nevi of trunk: Secondary | ICD-10-CM | POA: Diagnosis not present

## 2017-11-22 DIAGNOSIS — L821 Other seborrheic keratosis: Secondary | ICD-10-CM | POA: Diagnosis not present

## 2017-11-29 ENCOUNTER — Ambulatory Visit (INDEPENDENT_AMBULATORY_CARE_PROVIDER_SITE_OTHER): Payer: Medicare Other | Admitting: Sports Medicine

## 2017-11-29 VITALS — BP 124/88 | Ht 76.0 in | Wt 220.0 lb

## 2017-11-29 DIAGNOSIS — M7711 Lateral epicondylitis, right elbow: Secondary | ICD-10-CM | POA: Diagnosis present

## 2017-11-29 MED ORDER — TRAMADOL HCL 50 MG PO TABS: ORAL_TABLET | ORAL | 0 refills | Status: DC

## 2017-11-29 MED ORDER — PREDNISONE 10 MG PO TABS: ORAL_TABLET | ORAL | 0 refills | Status: DC

## 2017-11-29 MED ORDER — HYDROCODONE-ACETAMINOPHEN 10-325 MG PO TABS: 1.0000 | ORAL_TABLET | Freq: Four times a day (QID) | ORAL | 0 refills | Status: DC | PRN

## 2017-11-30 ENCOUNTER — Encounter: Payer: Self-pay | Admitting: Sports Medicine

## 2017-11-30 NOTE — Progress Notes (Signed)
   Subjective:    Patient ID: Isaiah Price, male    DOB: 1951/09/12, 67 y.o.   MRN: [id number]  HPI chief complaint: Right elbow pain  Isaiah Price comes in today complaining of 4-5 weeks of right elbow pain. He localizes all of his pain to the lateral elbow. It does radiate up into his humerus and down into his forearm and wrist. He denies any trauma. Pain is most noticeable with direct pressure over the elbow and forearm as well as with any sort of activity that requires him to hold or push heavy objects with his right hand. He's getting similar symptoms in the left elbow as well, although not as severe. No numbness or tingling. He has had lateral epicondylitis in the past and his symptoms are similar to that. It took several months for his symptoms to resolve back then. Since his last office visit with [place], patient has discontinued his Humira (due to cost). His rheumatologist recently started him on Inflectra infusions and told him that it may take several weeks for him to notice any symptom relief with this treatment. In addition to his elbows he has also noticed swelling in his hands as well as some returning neck pain.     Review of Systems    As above  Objective:   Physical Exam  Well-developed, well-nourished. No acute distress. Awake alert and oriented 3. Vital signs reviewed  Right elbow: Full range of motion. No effusion. He is tender to palpation directly over the lateral epicondyle and has reproducible pain with resisted ECRB testing. He is also tender to palpation along the proximal dorsal forearm but no soft tissue swelling here. He does have some mild swelling in both hands. Negative Tinel's over the cubital tunnel. Decreased grip strength secondary to pain. Good pulses.  Left elbow: Full range of motion. No effusion. Slight tenderness to palpation over the lateral epicondyle. Good pulses. Slight swelling of his hand and wrist diffusely.      Assessment & Plan:  Bilateral elbow pain,  right greater than left, secondary to lateral epicondylitis Rheumatoid arthritis  I believe his symptoms are secondary to his rheumatoid arthritis. As he transitions from Humira to [place] he may have increasing symptoms. After discussing treatment including cortisone injection, physical therapy for dry needling, and oral prednisone we've decided to do a 12 day Sterapred Dosepak in hopes of calming down all of his systemic musculoskeletal pain. Of note, patient did check with his rheumatologist to make sure that this was okay to start. I've also refilled his tramadol and hydrocodone to take as needed for more severe pain. He'll continue to follow-up with his rheumatologist and will follow-up with me as needed. If right elbow pain worsens we can reconsider physical therapy for dry needling or possibly a cortisone injection.

## 2017-12-19 DIAGNOSIS — Z79899 Other long term (current) drug therapy: Secondary | ICD-10-CM | POA: Diagnosis not present

## 2017-12-19 DIAGNOSIS — M0579 Rheumatoid arthritis with rheumatoid factor of multiple sites without organ or systems involvement: Secondary | ICD-10-CM | POA: Diagnosis not present

## 2017-12-27 DIAGNOSIS — M150 Primary generalized (osteo)arthritis: Secondary | ICD-10-CM | POA: Diagnosis not present

## 2017-12-27 DIAGNOSIS — M2550 Pain in unspecified joint: Secondary | ICD-10-CM | POA: Diagnosis not present

## 2017-12-27 DIAGNOSIS — M0579 Rheumatoid arthritis with rheumatoid factor of multiple sites without organ or systems involvement: Secondary | ICD-10-CM | POA: Diagnosis not present

## 2017-12-27 DIAGNOSIS — H209 Unspecified iridocyclitis: Secondary | ICD-10-CM | POA: Diagnosis not present

## 2017-12-27 DIAGNOSIS — M5030 Other cervical disc degeneration, unspecified cervical region: Secondary | ICD-10-CM | POA: Diagnosis not present

## 2017-12-27 DIAGNOSIS — E663 Overweight: Secondary | ICD-10-CM | POA: Diagnosis not present

## 2017-12-27 DIAGNOSIS — M7711 Lateral epicondylitis, right elbow: Secondary | ICD-10-CM | POA: Diagnosis not present

## 2017-12-27 DIAGNOSIS — M7662 Achilles tendinitis, left leg: Secondary | ICD-10-CM | POA: Diagnosis not present

## 2017-12-27 DIAGNOSIS — Z6827 Body mass index (BMI) 27.0-27.9, adult: Secondary | ICD-10-CM | POA: Diagnosis not present

## 2017-12-28 DIAGNOSIS — F5104 Psychophysiologic insomnia: Secondary | ICD-10-CM | POA: Diagnosis not present

## 2017-12-28 DIAGNOSIS — I10 Essential (primary) hypertension: Secondary | ICD-10-CM | POA: Diagnosis not present

## 2017-12-28 DIAGNOSIS — Z0000 Encounter for general adult medical examination without abnormal findings: Secondary | ICD-10-CM | POA: Diagnosis not present

## 2017-12-28 DIAGNOSIS — K219 Gastro-esophageal reflux disease without esophagitis: Secondary | ICD-10-CM | POA: Diagnosis not present

## 2018-01-09 ENCOUNTER — Other Ambulatory Visit: Payer: Self-pay | Admitting: Sports Medicine

## 2018-01-19 DIAGNOSIS — H16223 Keratoconjunctivitis sicca, not specified as Sjogren's, bilateral: Secondary | ICD-10-CM | POA: Diagnosis not present

## 2018-01-19 DIAGNOSIS — H40052 Ocular hypertension, left eye: Secondary | ICD-10-CM | POA: Diagnosis not present

## 2018-01-19 DIAGNOSIS — Z961 Presence of intraocular lens: Secondary | ICD-10-CM | POA: Diagnosis not present

## 2018-02-06 ENCOUNTER — Ambulatory Visit (INDEPENDENT_AMBULATORY_CARE_PROVIDER_SITE_OTHER): Payer: Medicare Other | Admitting: Sports Medicine

## 2018-02-06 ENCOUNTER — Encounter: Payer: Self-pay | Admitting: Sports Medicine

## 2018-02-06 VITALS — BP 118/88 | Ht 76.0 in | Wt 220.0 lb

## 2018-02-06 DIAGNOSIS — M5412 Radiculopathy, cervical region: Secondary | ICD-10-CM

## 2018-02-06 MED ORDER — TRAMADOL HCL 50 MG PO TABS: ORAL_TABLET | ORAL | 0 refills | Status: DC

## 2018-02-06 NOTE — Patient Instructions (Signed)
Call Conrad at [place] [phone]) to set up your injection

## 2018-02-06 NOTE — Progress Notes (Signed)
   Subjective:    Patient ID: Isaiah Price, male    DOB: 11/21/1950, 67 y.o.   MRN: [id number]  HPI chief complaint: Neck pain  Isaiah Price comes in today complaining of 2 months of left-sided neck pain. He has a history of multilevel degenerative changes including facet arthropathy. Last MRI of his cervical spine was done in May of last year. He has had some limited success with facet injections and epidural injections in the past. He denies numbness or tingling into his arms. Pain is localized to the left side of his cervical spine just below the skull. Most noticeable with cervical spine movement. He would be interested in a repeat facet injection if warranted. Last facet injections were in 2016. They were done by Dr.Curnes at [place]. Patient last saw Dr. Sherwood Gambler a little over a year ago who told him to continue with conservative treatment for long as possible. He is also being treated by rheumatology. He is in the process of starting a new medication for his rheumatoid arthritis. Up until recently, he was on a low dose of prednisone but had to stop it due to side effects. He still takes tramadol as needed and finds it to be helpful.    Review of Systems    as above Objective:   Physical Exam  Well-developed, well-nourished. No acute distress  Cervical spine: He has limited range of motion in all planes. He is tender to palpation along the left side of the cervical spine just below the occiput. This is in the area of the C3-C4 facets. No spasm. No midline tenderness. No gross focal neurological deficits of either upper extremity.      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 Jola Baptist. I will refill his tramadol. He will follow-up with rheumatology as scheduled. Follow-up with me as  needed.

## 2018-02-07 ENCOUNTER — Other Ambulatory Visit: Payer: Self-pay | Admitting: Sports Medicine

## 2018-02-07 DIAGNOSIS — M5412 Radiculopathy, cervical region: Secondary | ICD-10-CM

## 2018-02-13 DIAGNOSIS — M0579 Rheumatoid arthritis with rheumatoid factor of multiple sites without organ or systems involvement: Secondary | ICD-10-CM | POA: Diagnosis not present

## 2018-02-27 ENCOUNTER — Other Ambulatory Visit: Payer: Self-pay | Admitting: Sports Medicine

## 2018-02-27 ENCOUNTER — Ambulatory Visit
Admission: RE | Admit: 2018-02-27 | Discharge: 2018-02-27 | Disposition: A | Discharge status: Home / Self Care | Payer: Medicare Other | Source: Ambulatory Visit | Attending: Sports Medicine | Admitting: Sports Medicine

## 2018-02-27 DIAGNOSIS — M5412 Radiculopathy, cervical region: Secondary | ICD-10-CM

## 2018-02-27 DIAGNOSIS — M4692 Unspecified inflammatory spondylopathy, cervical region: Secondary | ICD-10-CM | POA: Diagnosis not present

## 2018-02-27 MED ORDER — TRIAMCINOLONE ACETONIDE 40 MG/ML IJ SUSP (RADIOLOGY): 60.0000 mg | Freq: Once | INTRAMUSCULAR | Status: DC

## 2018-02-27 MED ORDER — DEXAMETHASONE SODIUM PHOSPHATE 4 MG/ML IJ SOLN
6.0000 mg | Freq: Once | INTRAMUSCULAR | Status: AC
  Administered 2018-02-27: 6 mg via INTRA_ARTICULAR

## 2018-02-27 MED ORDER — IOPAMIDOL (ISOVUE-M 300) INJECTION 61%: 1.0000 mL | Freq: Once | INTRAMUSCULAR | Status: DC | PRN

## 2018-02-27 MED ORDER — IOPAMIDOL (ISOVUE-M 300) INJECTION 61%
1.0000 mL | Freq: Once | INTRAMUSCULAR | Status: AC | PRN
  Administered 2018-02-27: 1 mL via INTRA_ARTICULAR

## 2018-02-27 NOTE — Discharge Instructions (Signed)

## 2018-02-28 DIAGNOSIS — M5030 Other cervical disc degeneration, unspecified cervical region: Secondary | ICD-10-CM | POA: Diagnosis not present

## 2018-02-28 DIAGNOSIS — M150 Primary generalized (osteo)arthritis: Secondary | ICD-10-CM | POA: Diagnosis not present

## 2018-02-28 DIAGNOSIS — M0579 Rheumatoid arthritis with rheumatoid factor of multiple sites without organ or systems involvement: Secondary | ICD-10-CM | POA: Diagnosis not present

## 2018-02-28 DIAGNOSIS — Z6827 Body mass index (BMI) 27.0-27.9, adult: Secondary | ICD-10-CM | POA: Diagnosis not present

## 2018-02-28 DIAGNOSIS — M7711 Lateral epicondylitis, right elbow: Secondary | ICD-10-CM | POA: Diagnosis not present

## 2018-02-28 DIAGNOSIS — H209 Unspecified iridocyclitis: Secondary | ICD-10-CM | POA: Diagnosis not present

## 2018-02-28 DIAGNOSIS — M7662 Achilles tendinitis, left leg: Secondary | ICD-10-CM | POA: Diagnosis not present

## 2018-02-28 DIAGNOSIS — M2550 Pain in unspecified joint: Secondary | ICD-10-CM | POA: Diagnosis not present

## 2018-02-28 DIAGNOSIS — E663 Overweight: Secondary | ICD-10-CM | POA: Diagnosis not present

## 2018-03-29 ENCOUNTER — Other Ambulatory Visit: Payer: Self-pay

## 2018-03-29 DIAGNOSIS — M5412 Radiculopathy, cervical region: Secondary | ICD-10-CM

## 2018-04-04 ENCOUNTER — Other Ambulatory Visit: Payer: Self-pay | Admitting: Sports Medicine

## 2018-04-17 DIAGNOSIS — Z79899 Other long term (current) drug therapy: Secondary | ICD-10-CM | POA: Diagnosis not present

## 2018-04-17 DIAGNOSIS — M0579 Rheumatoid arthritis with rheumatoid factor of multiple sites without organ or systems involvement: Secondary | ICD-10-CM | POA: Diagnosis not present

## 2018-04-27 DIAGNOSIS — M0579 Rheumatoid arthritis with rheumatoid factor of multiple sites without organ or systems involvement: Secondary | ICD-10-CM | POA: Diagnosis not present

## 2018-04-27 DIAGNOSIS — E663 Overweight: Secondary | ICD-10-CM | POA: Diagnosis not present

## 2018-04-27 DIAGNOSIS — M5030 Other cervical disc degeneration, unspecified cervical region: Secondary | ICD-10-CM | POA: Diagnosis not present

## 2018-04-27 DIAGNOSIS — H209 Unspecified iridocyclitis: Secondary | ICD-10-CM | POA: Diagnosis not present

## 2018-04-27 DIAGNOSIS — M7662 Achilles tendinitis, left leg: Secondary | ICD-10-CM | POA: Diagnosis not present

## 2018-04-27 DIAGNOSIS — Z6827 Body mass index (BMI) 27.0-27.9, adult: Secondary | ICD-10-CM | POA: Diagnosis not present

## 2018-04-27 DIAGNOSIS — M150 Primary generalized (osteo)arthritis: Secondary | ICD-10-CM | POA: Diagnosis not present

## 2018-04-27 DIAGNOSIS — M7711 Lateral epicondylitis, right elbow: Secondary | ICD-10-CM | POA: Diagnosis not present

## 2018-04-27 DIAGNOSIS — M2550 Pain in unspecified joint: Secondary | ICD-10-CM | POA: Diagnosis not present

## 2018-05-08 ENCOUNTER — Encounter: Payer: Self-pay | Admitting: Sports Medicine

## 2018-05-08 ENCOUNTER — Ambulatory Visit (INDEPENDENT_AMBULATORY_CARE_PROVIDER_SITE_OTHER): Payer: Medicare Other | Admitting: Sports Medicine

## 2018-05-08 VITALS — BP 128/80 | Ht 76.0 in | Wt 220.0 lb

## 2018-05-08 DIAGNOSIS — M542 Cervicalgia: Secondary | ICD-10-CM | POA: Diagnosis present

## 2018-05-08 MED ORDER — TRAMADOL HCL 50 MG PO TABS: ORAL_TABLET | ORAL | 0 refills | Status: DC

## 2018-05-08 MED ORDER — HYDROCODONE-ACETAMINOPHEN 10-325 MG PO TABS: 1.0000 | ORAL_TABLET | Freq: Four times a day (QID) | ORAL | 0 refills | Status: DC | PRN

## 2018-05-08 NOTE — Progress Notes (Signed)
   Subjective:    Patient ID: Isaiah Price, male    DOB: 1951/08/16, 67 y.o.   MRN: [id number]  HPI   Isaiah Price comes in today for follow-up on neck pain. He is status post bilateral C4-C5 cervical facet injections and left C3-C4 facet injection. Injections were initially helpful but his pain is returning. He also informs me that he is out of remission regarding his rheumatoid arthritis. He continues to work with his rheumatologist and recently changed medications. He is currently on 10-15 mg daily of prednisone and back on methotrexate in addition to having recently started Inflectra. Prednisone does seem to help.    Review of Systems    as above Objective:   Physical Exam  Well-developed, well-nourished. No acute distress. Sitting In the exam room.  Cervical spine: Limited cervical range of motion in all planes.  Right elbow:Patient lacks about 5 of extension. Full flexion. No obvious effusion.  Examination of his hands shows an inability to make a complete fist bilaterally due to flexor tenosynovitis.      Assessment & Plan:   Cervical spine arthropathy Rheumatoid arthritis  His rheumatoid arthritis is quite bad. He is continuing to follow with rheumatology. He also uses tramadol and hydrocodone sparingly for pain. They do seem to help. I refilled both of these medications for him as I believe that he is using them responsibly. The underlying reason for most of his discomfort is his rheumatoid arthritis and I've encouraged him to continue to follow closely with rheumatology. Hopefully he will have better luck with Inflectra. Follow-up as needed.

## 2018-05-10 ENCOUNTER — Encounter: Payer: Self-pay | Admitting: Sports Medicine

## 2018-06-06 ENCOUNTER — Ambulatory Visit (INDEPENDENT_AMBULATORY_CARE_PROVIDER_SITE_OTHER): Payer: Medicare Other | Admitting: Sports Medicine

## 2018-06-06 ENCOUNTER — Encounter

## 2018-06-06 VITALS — BP 120/70 | Ht 76.0 in | Wt 211.0 lb

## 2018-06-06 DIAGNOSIS — M5441 Lumbago with sciatica, right side: Secondary | ICD-10-CM | POA: Diagnosis not present

## 2018-06-06 MED ORDER — PREDNISONE 10 MG PO TABS: ORAL_TABLET | ORAL | 0 refills | Status: DC

## 2018-06-07 ENCOUNTER — Encounter: Payer: Self-pay | Admitting: Sports Medicine

## 2018-06-07 NOTE — Progress Notes (Signed)
   Subjective:    Patient ID: Isaiah Price, male    DOB: February 02, 1951, 67 y.o.   MRN: [id number]  HPI chief complaint: Low back pain  Isaiah Price comes in today complaining of 3 weeks of left-sided low back pain. Pain began after he was moving some heavy luggage. He does not recall any specific trauma however. In addition to his low back pain, he is also getting radiating pain down the left leg into the left foot and ankle. He has not noticed any weakness. No change in bowel or bladder. Symptoms are worse with sitting and improves with standing. He denies groin pain. He does describe a remote history of back pain in the past but nothing recently.   Review of Systems    as above Objective:   Physical Exam  Well-developed, well-nourished. He is more comfortable standing than sitting.  Lumbar spine: Pain with forward flexion. No pain with extension. He is tender to palpation just to the right of the L4-L5 midline. No tenderness along the midline itself.  Left hip: Smooth painless hip range of motion with a negative logroll.  Neurological exam: Negative for a leg raise bilaterally. Patellar reflexes are 2+ and equal bilaterally. Achilles reflexes are trace but equal bilaterally. Good strength. Slight decrease in sensation along the dorsum of the left foot. No atrophy. Good pulses.      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 It should be noted that the patient has been on prednisone intermittently for several months for various musculoskeletal complaints and his rheumatoid arthritis. I explained to the patient that there are real risks and side effects with chronic use of prednisone.He understands these risks and is willing to take the 12 day Sterapred Dosepak. He will also start [place] extension exercises. If symptoms persist then I would consider imaging in the form of x-ray and MRI specifically to rule out a lumbar disc  herniation. Patient will follow-up with me as needed.

## 2018-06-12 DIAGNOSIS — M0579 Rheumatoid arthritis with rheumatoid factor of multiple sites without organ or systems involvement: Secondary | ICD-10-CM | POA: Diagnosis not present

## 2018-06-12 DIAGNOSIS — Z79899 Other long term (current) drug therapy: Secondary | ICD-10-CM | POA: Diagnosis not present

## 2018-06-21 ENCOUNTER — Other Ambulatory Visit: Payer: Self-pay | Admitting: Sports Medicine

## 2018-06-27 DIAGNOSIS — H40052 Ocular hypertension, left eye: Secondary | ICD-10-CM | POA: Diagnosis not present

## 2018-06-27 DIAGNOSIS — H26493 Other secondary cataract, bilateral: Secondary | ICD-10-CM | POA: Diagnosis not present

## 2018-06-27 DIAGNOSIS — Z961 Presence of intraocular lens: Secondary | ICD-10-CM | POA: Diagnosis not present

## 2018-06-27 DIAGNOSIS — H16223 Keratoconjunctivitis sicca, not specified as Sjogren's, bilateral: Secondary | ICD-10-CM | POA: Diagnosis not present

## 2018-06-27 DIAGNOSIS — H10413 Chronic giant papillary conjunctivitis, bilateral: Secondary | ICD-10-CM | POA: Diagnosis not present

## 2018-06-29 DIAGNOSIS — K219 Gastro-esophageal reflux disease without esophagitis: Secondary | ICD-10-CM | POA: Diagnosis not present

## 2018-06-29 DIAGNOSIS — E7800 Pure hypercholesterolemia, unspecified: Secondary | ICD-10-CM | POA: Diagnosis not present

## 2018-06-29 DIAGNOSIS — F5104 Psychophysiologic insomnia: Secondary | ICD-10-CM | POA: Diagnosis not present

## 2018-06-29 DIAGNOSIS — I10 Essential (primary) hypertension: Secondary | ICD-10-CM | POA: Diagnosis not present

## 2018-07-03 DIAGNOSIS — M0579 Rheumatoid arthritis with rheumatoid factor of multiple sites without organ or systems involvement: Secondary | ICD-10-CM | POA: Diagnosis not present

## 2018-07-03 DIAGNOSIS — M150 Primary generalized (osteo)arthritis: Secondary | ICD-10-CM | POA: Diagnosis not present

## 2018-07-03 DIAGNOSIS — Z6826 Body mass index (BMI) 26.0-26.9, adult: Secondary | ICD-10-CM | POA: Diagnosis not present

## 2018-07-03 DIAGNOSIS — M7711 Lateral epicondylitis, right elbow: Secondary | ICD-10-CM | POA: Diagnosis not present

## 2018-07-03 DIAGNOSIS — R972 Elevated prostate specific antigen [PSA]: Secondary | ICD-10-CM | POA: Diagnosis not present

## 2018-07-03 DIAGNOSIS — E663 Overweight: Secondary | ICD-10-CM | POA: Diagnosis not present

## 2018-07-03 DIAGNOSIS — M2550 Pain in unspecified joint: Secondary | ICD-10-CM | POA: Diagnosis not present

## 2018-07-03 DIAGNOSIS — R42 Dizziness and giddiness: Secondary | ICD-10-CM | POA: Diagnosis not present

## 2018-07-03 DIAGNOSIS — M7662 Achilles tendinitis, left leg: Secondary | ICD-10-CM | POA: Diagnosis not present

## 2018-07-03 DIAGNOSIS — H209 Unspecified iridocyclitis: Secondary | ICD-10-CM | POA: Diagnosis not present

## 2018-07-03 DIAGNOSIS — M5030 Other cervical disc degeneration, unspecified cervical region: Secondary | ICD-10-CM | POA: Diagnosis not present

## 2018-07-10 DIAGNOSIS — N401 Enlarged prostate with lower urinary tract symptoms: Secondary | ICD-10-CM | POA: Diagnosis not present

## 2018-07-10 DIAGNOSIS — R3912 Poor urinary stream: Secondary | ICD-10-CM | POA: Diagnosis not present

## 2018-07-10 DIAGNOSIS — R972 Elevated prostate specific antigen [PSA]: Secondary | ICD-10-CM | POA: Diagnosis not present

## 2018-07-11 DIAGNOSIS — H26491 Other secondary cataract, right eye: Secondary | ICD-10-CM | POA: Diagnosis not present

## 2018-07-11 DIAGNOSIS — Z961 Presence of intraocular lens: Secondary | ICD-10-CM | POA: Diagnosis not present

## 2018-07-19 DIAGNOSIS — Z23 Encounter for immunization: Secondary | ICD-10-CM | POA: Diagnosis not present

## 2018-07-24 ENCOUNTER — Other Ambulatory Visit: Payer: Self-pay | Admitting: Sports Medicine

## 2018-08-08 DIAGNOSIS — H01021 Squamous blepharitis right upper eyelid: Secondary | ICD-10-CM | POA: Diagnosis not present

## 2018-08-08 DIAGNOSIS — H01025 Squamous blepharitis left lower eyelid: Secondary | ICD-10-CM | POA: Diagnosis not present

## 2018-08-08 DIAGNOSIS — Z961 Presence of intraocular lens: Secondary | ICD-10-CM | POA: Diagnosis not present

## 2018-08-08 DIAGNOSIS — H01024 Squamous blepharitis left upper eyelid: Secondary | ICD-10-CM | POA: Diagnosis not present

## 2018-08-08 DIAGNOSIS — H16223 Keratoconjunctivitis sicca, not specified as Sjogren's, bilateral: Secondary | ICD-10-CM | POA: Diagnosis not present

## 2018-08-08 DIAGNOSIS — H10413 Chronic giant papillary conjunctivitis, bilateral: Secondary | ICD-10-CM | POA: Diagnosis not present

## 2018-08-08 DIAGNOSIS — H01022 Squamous blepharitis right lower eyelid: Secondary | ICD-10-CM | POA: Diagnosis not present

## 2018-08-09 DIAGNOSIS — R972 Elevated prostate specific antigen [PSA]: Secondary | ICD-10-CM | POA: Diagnosis not present

## 2018-08-09 DIAGNOSIS — D075 Carcinoma in situ of prostate: Secondary | ICD-10-CM | POA: Diagnosis not present

## 2018-08-23 DIAGNOSIS — C61 Malignant neoplasm of prostate: Secondary | ICD-10-CM | POA: Diagnosis not present

## 2018-08-23 DIAGNOSIS — R3912 Poor urinary stream: Secondary | ICD-10-CM | POA: Diagnosis not present

## 2018-08-23 DIAGNOSIS — N401 Enlarged prostate with lower urinary tract symptoms: Secondary | ICD-10-CM | POA: Diagnosis not present

## 2018-08-31 DIAGNOSIS — R42 Dizziness and giddiness: Secondary | ICD-10-CM | POA: Diagnosis not present

## 2018-08-31 DIAGNOSIS — M542 Cervicalgia: Secondary | ICD-10-CM | POA: Diagnosis not present

## 2018-08-31 DIAGNOSIS — M7711 Lateral epicondylitis, right elbow: Secondary | ICD-10-CM | POA: Diagnosis not present

## 2018-08-31 DIAGNOSIS — H209 Unspecified iridocyclitis: Secondary | ICD-10-CM | POA: Diagnosis not present

## 2018-08-31 DIAGNOSIS — C61 Malignant neoplasm of prostate: Secondary | ICD-10-CM | POA: Diagnosis not present

## 2018-08-31 DIAGNOSIS — M5030 Other cervical disc degeneration, unspecified cervical region: Secondary | ICD-10-CM | POA: Diagnosis not present

## 2018-08-31 DIAGNOSIS — M150 Primary generalized (osteo)arthritis: Secondary | ICD-10-CM | POA: Diagnosis not present

## 2018-08-31 DIAGNOSIS — M2550 Pain in unspecified joint: Secondary | ICD-10-CM | POA: Diagnosis not present

## 2018-08-31 DIAGNOSIS — M0579 Rheumatoid arthritis with rheumatoid factor of multiple sites without organ or systems involvement: Secondary | ICD-10-CM | POA: Diagnosis not present

## 2018-08-31 DIAGNOSIS — M7662 Achilles tendinitis, left leg: Secondary | ICD-10-CM | POA: Diagnosis not present

## 2018-09-11 DIAGNOSIS — C61 Malignant neoplasm of prostate: Secondary | ICD-10-CM | POA: Diagnosis not present

## 2018-09-12 ENCOUNTER — Other Ambulatory Visit: Payer: Self-pay | Admitting: *Deleted

## 2018-09-12 MED ORDER — TRAMADOL HCL 50 MG PO TABS: 50.0000 mg | ORAL_TABLET | Freq: Three times a day (TID) | ORAL | 0 refills | Status: DC | PRN

## 2018-09-13 ENCOUNTER — Encounter: Payer: Self-pay | Admitting: Radiation Oncology

## 2018-09-13 NOTE — Progress Notes (Signed)
GU Location of Tumor / Histology: prostatic adenocarcinoma   If Prostate Cancer, Gleason Score is (3 + 4) and PSA is (5.73). Prostate volume: 27 cc.   Isaiah Price had his original prostate biopsy in 2012. He returned for further follow up on 07/10/2018 of his elevated PSA. Confirms he follow up every 3-6 months with Dr. Jeffie Pollock.  Biopsies of prostate (if applicable) revealed:    Past/Anticipated interventions by urology, if any: prostate biopsy, PSA monitoring, repeat PSA biopsy, referral to Dr. Tammi Klippel to discuss brachytherapy  Past/Anticipated interventions by medical oncology, if any: no  Weight changes, if any: no  Bowel/Bladder complaints, if any: Severe LUTS. IPSS 23. SHIM 23. Reports mild dysuria. Denies hematuria. Denies any urinary issues related to controlling his bladder. Reports since starting rapaflo 1 month ago for weak stream he has had post void dribble. Reports he was unable to take Flomax due to dizziness.   Nausea/Vomiting, if any: no  Pain issues, if any:  Reports pain in his right testicle and right groin x 5 day. Denies fever, edema or redness of right testicle.   SAFETY ISSUES:  Prior radiation? no  Pacemaker/ICD? no  Possible current pregnancy? no  Is the patient on methotrexate? Has RA but is off of the Remicade at this time. Denies taking methotrexate at this time. Reports taking Prednisone 5 mg to manage RA.   Current Complaints / other details:  67 year old male. Married. Business owner. Accompanied today by wife, Isaiah Price.

## 2018-09-18 ENCOUNTER — Encounter: Payer: Self-pay | Admitting: Radiation Oncology

## 2018-09-18 ENCOUNTER — Other Ambulatory Visit: Payer: Self-pay

## 2018-09-18 ENCOUNTER — Encounter: Payer: Self-pay | Admitting: Medical Oncology

## 2018-09-18 ENCOUNTER — Ambulatory Visit
Admission: RE | Admit: 2018-09-18 | Discharge: 2018-09-18 | Disposition: A | Discharge status: Home / Self Care | Payer: Medicare Other | Source: Ambulatory Visit | Attending: Radiation Oncology | Admitting: Radiation Oncology

## 2018-09-18 VITALS — BP 133/88 | HR 97 | Temp 97.6°F | Resp 18 | Ht 76.0 in | Wt 214.0 lb

## 2018-09-18 DIAGNOSIS — Z87891 Personal history of nicotine dependence: Secondary | ICD-10-CM | POA: Diagnosis not present

## 2018-09-18 DIAGNOSIS — R399 Unspecified symptoms and signs involving the genitourinary system: Secondary | ICD-10-CM

## 2018-09-18 DIAGNOSIS — C61 Malignant neoplasm of prostate: Secondary | ICD-10-CM | POA: Insufficient documentation

## 2018-09-18 DIAGNOSIS — R3912 Poor urinary stream: Secondary | ICD-10-CM | POA: Diagnosis not present

## 2018-09-18 DIAGNOSIS — I10 Essential (primary) hypertension: Secondary | ICD-10-CM | POA: Insufficient documentation

## 2018-09-18 DIAGNOSIS — R8271 Bacteriuria: Secondary | ICD-10-CM | POA: Diagnosis not present

## 2018-09-18 DIAGNOSIS — N401 Enlarged prostate with lower urinary tract symptoms: Secondary | ICD-10-CM | POA: Diagnosis not present

## 2018-09-18 HISTORY — DX: Malignant neoplasm of prostate: C61

## 2018-09-18 NOTE — Progress Notes (Signed)
2 Radiation Oncology         [phone] ________________________________  Initial Outpatient Consultation  Name: Isaiah Price MRN: [id number]  Date: 09/18/2018  DOB: October 16, 1951  CC:[name], MD  Irine Seal, MD   REFERRING PHYSICIAN: Irine Seal, MD  DIAGNOSIS: 67 y.o. 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 Malignant neoplasm of prostate [facility] [place] [facility] referral to Social Work    HISTORY OF PRESENT ILLNESS: Isaiah Price is a 67 y.o. male with a diagnosis of prostate cancer. He was been followed by Dr. Jeffie Pollock for an elevated PSA and lower urinary symptoms of outflow obstruction since 2012. 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 He has tried tamsulosin and alfuzosin in the past with no success. He's been offered TURP and Urolift but has declined. He was noted to have a steadily rising PSA, most recent from 07/03/18 being 5.73 The patient proceeded to transrectal ultrasound with 12 biopsies of the prostate on 08/09/18.  The prostate volume measured 27 cc.  Out of 12 core biopsies, 2 were positive.  The maximum Gleason score was 3+4, and this was seen in left mid lateral. Gleason 3+3 was also noted in right apex lateral.  The patient reviewed the biopsy results with his urologist and he has kindly been referred today for discussion of potential radiation treatment options. He will also see Dr. Jeffie Pollock at 1 pm today, 09/18/18, to further discuss treatment options.  PREVIOUS RADIATION THERAPY: No  PAST MEDICAL HISTORY:  Past Medical History:  Diagnosis Date  . Cervical radiculopathy 07/14/2011   Suspect this is related to degenerative disc disease of the cervical spine and a history of a failed fusion. Clinically he has significant involvement of C5/C6  . Chronic insomnia 12/31/2015  . Chronic pain 12/31/2015   Neck  . Esophageal reflux 12/31/2015  .  HTN (hypertension)   . Hypercholesteremia 12/31/2015  . IT band syndrome 05/06/2014  . Osteoarthritis of [place] joint of thumb 06/02/2011   There is extensive osteoarthritis in both hands with the right more symptomatic but the left showing greater change on ultrasound.   Marland Kitchen PIN (prostatic intraepithelial neoplasia)   . Prostate cancer ([place])   . Rheumatoid arthritis ([place]) 01/22/2016  . 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 CARPOMETACARPAL (CMC) FUSION OF THUMB    . eye lens surgery Bilateral 2013   CE/PCIOL monovision  . NECK SURGERY    . SHOULDER SURGERY    . TONSILLECTOMY    . VASECTOMY      FAMILY HISTORY:  Family History  Problem Relation Age of Onset  . Heart disease Father        Aortic Valve Stenosis, CABG 75  . Hypertension Father   . Macular degeneration Mother   . Heart disease Mother        MVR  . Osteosarcoma Paternal Uncle   . Osteosarcoma Son     SOCIAL HISTORY:  Social History   Socioeconomic History  . Marital status: Married    Spouse name: Not on file  . Number of children: Not on file  . Years of education: Not on file  . Highest education level: Not on file  Occupational History  . Not on file  Social Needs  .  Financial resource strain: Not on file  . Food insecurity:    Worry: Not on file    Inability: Not on file  . Transportation needs:    Medical: Not on file    Non-medical: Not on file  Tobacco Use  . Smoking status: Former Smoker    Packs/day: 1.00    Years: 30.00    Pack years: 30.00    Types: Cigarettes    Last attempt to quit: 12/13/2011    Years since quitting: 6.7  . Smokeless tobacco: Never Used  Substance and Sexual Activity  . Alcohol use: Yes  . Drug use: No  . Sexual activity: Not on file  Lifestyle  . Physical activity:    Days per week: Not on file    Minutes per session: Not on file  . Stress: Not on file  Relationships  . Social  connections:    Talks on phone: Not on file    Gets together: Not on file    Attends religious service: Not on file    Active member of club or organization: Not on file    Attends meetings of clubs or organizations: Not on file    Relationship status: Not on file  . Intimate partner violence:    Fear of current or ex partner: Not on file    Emotionally abused: Not on file    Physically abused: Not on file    Forced sexual activity: Not on file  Other Topics Concern  . Not on file  Social History Narrative   Retired from Adult nurse.    The patient is married and lives in [place]. 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  ALLERGIES: Nsaids; Brimonidine; Gabapentin; Meloxicam; and Other  MEDICATIONS:  Current Outpatient Medications  Medication Sig Dispense Refill  . atorvastatin (LIPITOR) 40 MG tablet TAKE 1 TABLET ONCE DAILY FOR CHOLESTEROL.    Marland Kitchen diltiazem (CARDIZEM) 90 MG tablet Take 1 tablet daily    . famotidine (PEPCID) 20 MG tablet Take 20 mg by mouth.    Marland Kitchen HYDROcodone-acetaminophen (NORCO) 10-325 MG tablet Take 1 tablet by mouth every 6 (six) hours as needed. 20 tablet 0  . ipratropium (ATROVENT) 0.06 % nasal spray Place 2 sprays into both nostrils 4 (four) times daily. Takes prn    . omeprazole (PRILOSEC) 20 MG capsule Take one tablet every day to control acid reflux symptoms.    . Polyvinyl Alcohol-Povidone (REFRESH OP) Apply to eye.    . predniSONE (DELTASONE) 10 MG tablet TAKE 1 TABLET EVERY DAY WITH BREAKFAST. (Patient taking differently: 5 mg daily. TAKE 1 TABLET EVERY DAY WITH BREAKFAST.) 48 tablet 0  . silodosin (RAPAFLO) 8 MG CAPS capsule     . traMADol (ULTRAM) 50 MG tablet Take 1 tablet (50 mg total) by mouth every 8 (eight) hours as needed. for pain 60 tablet 0  . zolpidem (AMBIEN) 10 MG tablet Take one tablet at bedtime for insomnia.    . fluticasone (FLONASE) 50 MCG/ACT nasal spray Place into the nose  daily as needed.     . inFLIXimab-dyyb in sodium chloride 0.9 % Inject 3 mg/kg into the vein every 8 (eight) weeks.     No current facility-administered medications for this encounter.     REVIEW OF SYSTEMS:  On review of systems, the patient reports that he is doing well overall. He denies any chest pain, shortness of breath, cough, fevers, chills, night sweats, unintended weight changes. He denies  any bowel disturbances, and denies abdominal pain, nausea or vomiting. He reports pain to his right testicle and right groin. His IPSS was 23, indicating severe urinary symptoms. He reports mild dysuria, weak stream, and post-void dribble. He is currently taking rapaflo for relief which he's noticed modest improvement. He reports his IPSS scoring is based on his symptoms a few weeks ago. He denies any issues controlling his bladder. He is able to complete sexual activity with most attempts. A complete review of systems is obtained and is otherwise negative.    PHYSICAL EXAM:  Wt Readings from Last 3 Encounters:  09/18/18 214 lb (97.1 kg)  06/06/18 211 lb (95.7 kg)  05/08/18 220 lb (99.8 kg)   Temp Readings from Last 3 Encounters:  09/18/18 97.6 F (36.4 C) (Oral)   BP Readings from Last 3 Encounters:  09/18/18 133/88  06/06/18 120/70  05/08/18 128/80   Pulse Readings from Last 3 Encounters:  09/18/18 97  02/27/18 83  08/25/17 92   Pain Assessment Pain Score: 0-No pain/10  In general this is a well appearing Caucasian gentleman in no acute distress. He's alert and oriented x4 and appropriate throughout the examination. Cardiopulmonary assessment is negative for acute distress and he exhibits normal effort.    KPS = 90  100 - Normal; no complaints; no evidence of disease. 90   - Able to carry on normal activity; minor signs or symptoms of disease. 80   - Normal activity with effort; some signs or symptoms of disease. 38   - Cares for self; unable to carry on normal activity or to do  active work. 60   - Requires occasional assistance, but is able to care for most of his personal needs. 50   - Requires considerable assistance and frequent medical care. 21   - Disabled; requires special care and assistance. 58   - Severely disabled; hospital admission is indicated although death not imminent. 28   - Very sick; hospital admission necessary; active supportive treatment necessary. 10   - Moribund; fatal processes progressing rapidly. 0     - Dead  Karnofsky DA, Abelmann [place], Craver LS and Burchenal [facility] [phone]) The use of the nitrogen mustards in the palliative treatment of carcinoma: with particular reference to bronchogenic carcinoma Cancer 1 634-56  LABORATORY DATA:  Lab Results  Component Value Date   WBC 6.0 12/23/2014   HGB 15.8 12/23/2014   HCT 46.2 12/23/2014   MCV 87.8 12/23/2014   PLT 220 12/23/2014   Lab Results  Component Value Date   NA 140 12/23/2014   K 4.2 12/23/2014   CL 103 12/23/2014   CO2 28 12/23/2014   Lab Results  Component Value Date   ALT 29 12/23/2014   AST 16 12/23/2014   ALKPHOS 76 12/23/2014   BILITOT 0.9 12/23/2014     RADIOGRAPHY: No results found.    IMPRESSION/PLAN: 1. 67 y.o. gentleman with intermediate risk, Stage T1c adenocarcinoma of the prostate with Gleason Score of 3+4, and PSA of 5.73. We discussed the patient's workup and outlines the nature of prostate cancer in this setting. The patient's T stage, Gleason's score, and PSA put him into the favorable intermediate risk group. Accordingly, he is eligible for a variety of potential treatment options including brachytherapy, 5-8 weeks of external radiation, or prostatectomy. We discussed the available radiation techniques, and focused on the details and logistics and delivery. We discussed and outlined the risks, benefits, short and long-term effects associated with radiotherapy and compared  and contrasted these with prostatectomy. We discussed the role of SpaceOAR in reducing  the rectal toxicity associated with radiotherapy. Given his urinary symptoms, he may be a good candidate for Urolift, and he plans to discuss this further with Dr. Jeffie Pollock today. We also discussed that his urinary symptoms are not prohibitive of brachytherapy, but his symptoms could certainly impact his quality of life from this procedure. 2. LUTS. The patient is considering options of intervention with Urolift procedure. We will follow up regarding how he would like to approach treatment once he meets with Dr. Jeffie Pollock.     Carola Rhine, PAC    Tyler Pita, MD  [facility] Direct Dial: [phone]  Fax: [phone] [email or web address]  Skype  LinkedIn    Page Me        This document serves as a record of services personally performed by Shona Simpson, PA-C and Dr. Tammi Klippel. It was created on their behalf by Wilburn Mylar, a trained medical scribe. The creation of this record is based on the scribe's personal observations and the provider's statements to them. This document has been checked and approved by the attending provider.

## 2018-09-18 NOTE — Progress Notes (Signed)
Introduced myself to Isaiah Price and his wife as the prostate nurse navigator and my role. He states he was diagnosed in 2012 with prostate cancer but now with rising PSA. He had repeat biopsy 08/09/18 that shows Gleason 7 disease. He is here today to discuss treatment options. I gave him my business card and asked them to call with questions or concerns. He voiced understanding.

## 2018-09-18 NOTE — Progress Notes (Signed)
See progress note under physician encounter. 

## 2018-09-19 ENCOUNTER — Encounter: Payer: Self-pay | Admitting: General Practice

## 2018-09-19 NOTE — Progress Notes (Signed)
[place] Psychosocial Distress Screening Clinical Social Work  Clinical Social Work was referred by distress screening protocol.  The patient scored a 5 on the Psychosocial Distress Thermometer which indicates moderate distress. Clinical Social Worker contacted patient by phone to assess for distress and other psychosocial needs. Unable to reach patient by phone, left VM w information about [place] and contact information for CSW. Encouraged to call for help/resources as needed.    ONCBCN DISTRESS SCREENING 09/18/2018  Screening Type Initial Screening  Distress experienced in past week (1-10) 5  Practical problem type Insurance  Emotional problem type Adjusting to illness  Information Concerns Type Lack of info about treatment  Physician notified of physical symptoms Yes  Referral to clinical psychology No  Referral to clinical social work No  Referral to dietition No  Referral to financial advocate No  Referral to support programs No  Other may be reached at [phone]     Clinical Social Worker follow up needed: No.  If yes, follow up plan:  Beverely Pace, LCSW

## 2018-09-22 DIAGNOSIS — C61 Malignant neoplasm of prostate: Secondary | ICD-10-CM | POA: Diagnosis not present

## 2018-09-25 ENCOUNTER — Ambulatory Visit (INDEPENDENT_AMBULATORY_CARE_PROVIDER_SITE_OTHER): Payer: Medicare Other | Admitting: Sports Medicine

## 2018-09-25 VITALS — BP 124/86 | Ht 76.0 in | Wt 210.0 lb

## 2018-09-25 DIAGNOSIS — M0579 Rheumatoid arthritis with rheumatoid factor of multiple sites without organ or systems involvement: Secondary | ICD-10-CM

## 2018-09-25 DIAGNOSIS — M5412 Radiculopathy, cervical region: Secondary | ICD-10-CM | POA: Diagnosis not present

## 2018-09-25 DIAGNOSIS — M4802 Spinal stenosis, cervical region: Secondary | ICD-10-CM

## 2018-09-25 MED ORDER — HYDROCODONE-ACETAMINOPHEN 10-325 MG PO TABS: 1.0000 | ORAL_TABLET | Freq: Four times a day (QID) | ORAL | 0 refills | Status: DC | PRN

## 2018-09-26 NOTE — Progress Notes (Signed)
  Isaiah Price comes in today for follow-up on chronic neck and low back pain.  Since Isaiah Price last office visit, he has been diagnosed with prostate cancer.  He is trying to decide between radiation treatment and surgery.  Isaiah Price rheumatologist has removed him from all DMARDs for the time being and he is currently just on a low-dose of prednisone.  This, along with tramadol and as needed hydrocodone, seem to control Isaiah Price pain pretty well.  For now, Isaiah Price symptoms are tolerable.  Physical exam was not repeated.  We simply talked about Isaiah Price recent prostate cancer diagnosis and I refilled Isaiah Price hydrocodone.  Last refill was in July and he was only given 20 pills which he takes only as needed.  I have given him an additional 20 pills today.  At this time, I think he is leaning toward surgery for Isaiah Price prostate cancer and if so he is thinking about having this done in January.  Follow-up with me in 3 to 6 months or sooner if needed. Total time spent with the patient was 15 minutes with greater than 50% of the time spent in face-to-face consultation discussing Isaiah Price prostate cancer diagnosis, prognosis, and current medication usage.

## 2018-10-02 DIAGNOSIS — C61 Malignant neoplasm of prostate: Secondary | ICD-10-CM | POA: Diagnosis not present

## 2018-10-03 ENCOUNTER — Telehealth: Payer: Self-pay | Admitting: Radiation Oncology

## 2018-10-03 NOTE — Telephone Encounter (Addendum)
I called the patient and he's planning to meet with Dr. Alinda Money for prostatectomy. He has a 3+4 cancer, with a gland of 27, but significant LUTs. We will see him as needed in the future.

## 2018-10-17 ENCOUNTER — Other Ambulatory Visit: Payer: Self-pay | Admitting: Urology

## 2018-10-19 ENCOUNTER — Other Ambulatory Visit: Payer: Self-pay | Admitting: Urology

## 2018-10-19 MED ORDER — HYDROCORTISONE NA SUCCINATE PF 1000 MG IJ SOLR: 100.0000 mg | Freq: Once | INTRAMUSCULAR | Status: AC

## 2018-10-30 ENCOUNTER — Other Ambulatory Visit: Payer: Self-pay | Admitting: Sports Medicine

## 2018-11-02 DIAGNOSIS — C61 Malignant neoplasm of prostate: Secondary | ICD-10-CM | POA: Diagnosis not present

## 2018-11-02 DIAGNOSIS — M6281 Muscle weakness (generalized): Secondary | ICD-10-CM | POA: Diagnosis not present

## 2018-11-09 DIAGNOSIS — C61 Malignant neoplasm of prostate: Secondary | ICD-10-CM | POA: Diagnosis not present

## 2018-11-09 DIAGNOSIS — M6281 Muscle weakness (generalized): Secondary | ICD-10-CM | POA: Diagnosis not present

## 2018-11-09 NOTE — Patient Instructions (Signed)
[place]  [birth year]    Your procedure is scheduled on:  11-16-2018   Report to [facility] Main  Entrance,  Report to admitting at  5:30 AM    Call this number if you have problems the morning of surgery [phone]       Remember: Do not eat food or drink liquids :After Midnight.                                       BRUSH YOUR TEETH MORNING OF SURGERY AND RINSE YOUR MOUTH OUT, NO CHEWING GUM CANDY OR MINTS.        Take these medicines the morning of surgery with A SIP OF WATER:   Omeprazole, Prednisone, Silodosin (rapaflo)                                   You may not have any metal on your body including hair pins and               piercings  Do not wear jewelry, make-up, lotions, powders or perfumes, deodorant                       Men may shave face and neck.       Do not bring valuables to the hospital. [place].  Contacts, dentures or bridgework may not be worn into surgery.  Leave suitcase in the car. After surgery it may be brought to your room.      Special Instructions:  Drink one 8 oz. Bottle of Magnesium citrate @ noon day before surgery (purchase at any pharmacy)                                                 Do one Fleet enema the night before surgery (purchase at any pharmacy)         _____________________________________________________________________             [facility] - Preparing for Surgery Before surgery, you can play an important role.  Because skin is not sterile, your skin needs to be as free of germs as possible.  You can reduce the number of germs on your skin by washing with CHG (chlorahexidine gluconate) soap before surgery.  CHG is an antiseptic cleaner which kills germs and bonds with the skin to continue killing germs even after washing. Please DO NOT use if you have an allergy to CHG or antibacterial soaps.  If your skin becomes  reddened/irritated stop using the CHG and inform your nurse when you arrive at Short Stay. Do not shave (including legs and underarms) for at least 48 hours prior to the first CHG shower.  You may shave your face/neck. Please follow these instructions carefully:  1.  Shower with CHG Soap the night before surgery and the  morning of Surgery.  2.  If you choose to wash your hair, wash your hair first as usual with your  normal  shampoo.  3.  After you shampoo, rinse your hair and body thoroughly to remove the  shampoo.                            4.  Use CHG as you would any other liquid soap.  You can apply chg directly  to the skin and wash                       Gently with a scrungie or clean washcloth.  5.  Apply the CHG Soap to your body ONLY FROM THE NECK DOWN.   Do not use on face/ open                           Wound or open sores. Avoid contact with eyes, ears mouth and genitals (private parts).                       Wash face,  Genitals (private parts) with your normal soap.             6.  Wash thoroughly, paying special attention to the area where your surgery  will be performed.  7.  Thoroughly rinse your body with warm water from the neck down.  8.  DO NOT shower/wash with your normal soap after using and rinsing off  the CHG Soap.             9.  Pat yourself dry with a clean towel.            10.  Wear clean pajamas.            11.  Place clean sheets on your bed the night of your first shower and do not  sleep with pets. Day of Surgery : Do not apply any lotions/deodorants the morning of surgery.  Please wear clean clothes to the hospital/surgery center.  FAILURE TO FOLLOW THESE INSTRUCTIONS MAY RESULT IN THE CANCELLATION OF YOUR SURGERY PATIENT SIGNATURE_________________________________  NURSE SIGNATURE__________________________________  ________________________________________________________________________   Adam Phenix  An incentive spirometer is a tool that  can help keep your lungs clear and active. This tool measures how well you are filling your lungs with each breath. Taking long deep breaths may help reverse or decrease the chance of developing breathing (pulmonary) problems (especially infection) following:  A long period of time when you are unable to move or be active. BEFORE THE PROCEDURE   If the spirometer includes an indicator to show your best effort, your nurse or respiratory therapist will set it to a desired goal.  If possible, sit up straight or lean slightly forward. Try not to slouch.  Hold the incentive spirometer in an upright position. INSTRUCTIONS FOR USE  1. Sit on the edge of your bed if possible, or sit up as far as you can in bed or on a chair. 2. Hold the incentive spirometer in an upright position. 3. Breathe out normally. 4. Place the mouthpiece in your mouth and seal your lips tightly around it. 5. Breathe in slowly and as deeply as possible, raising the piston or the ball toward the top of the column. 6. Hold your breath for 3-5 seconds or for as long as possible. Allow the piston or ball to fall to the bottom of the column. 7. Remove the mouthpiece from your mouth and breathe out normally. 8. Rest for a  few seconds and repeat Steps 1 through 7 at least 10 times every 1-2 hours when you are awake. Take your time and take a few normal breaths between deep breaths. 9. The spirometer may include an indicator to show your best effort. Use the indicator as a goal to work toward during each repetition. 10. After each set of 10 deep breaths, practice coughing to be sure your lungs are clear. If you have an incision (the cut made at the time of surgery), support your incision when coughing by placing a pillow or rolled up towels firmly against it. Once you are able to get out of bed, walk around indoors and cough well. You may stop using the incentive spirometer when instructed by your caregiver.  RISKS AND  COMPLICATIONS  Take your time so you do not get dizzy or light-headed.  If you are in pain, you may need to take or ask for pain medication before doing incentive spirometry. It is harder to take a deep breath if you are having pain. AFTER USE  Rest and breathe slowly and easily.  It can be helpful to keep track of a log of your progress. Your caregiver can provide you with a simple table to help with this. If you are using the spirometer at home, follow these instructions: [place] IF:   You are having difficultly using the spirometer.  You have trouble using the spirometer as often as instructed.  Your pain medication is not giving enough relief while using the spirometer.  You develop fever of 100.5 F (38.1 C) or higher. SEEK IMMEDIATE MEDICAL CARE IF:   You cough up bloody sputum that had not been present before.  You develop fever of 102 F (38.9 C) or greater.  You develop worsening pain at or near the incision site. MAKE SURE YOU:   Understand these instructions.  Will watch your condition.  Will get help right away if you are not doing well or get worse. Document Released: 02/14/2007 Document Revised: 12/27/2011 Document Reviewed: 04/17/2007 [facility] Patient Information 2014 [place], [place].   ________________________________________________________________________

## 2018-11-13 ENCOUNTER — Ambulatory Visit (HOSPITAL_COMMUNITY)
Admission: RE | Admit: 2018-11-13 | Discharge: 2018-11-13 | Disposition: A | Discharge status: Home / Self Care | Payer: Medicare Other | Source: Ambulatory Visit | Attending: Anesthesiology | Admitting: Anesthesiology

## 2018-11-13 ENCOUNTER — Encounter (HOSPITAL_COMMUNITY): Payer: Self-pay

## 2018-11-13 ENCOUNTER — Encounter (HOSPITAL_COMMUNITY)
Admission: RE | Admit: 2018-11-13 | Discharge: 2018-11-13 | Disposition: A | Discharge status: Home / Self Care | Payer: Medicare Other | Source: Ambulatory Visit | Attending: Urology | Admitting: Urology

## 2018-11-13 ENCOUNTER — Other Ambulatory Visit: Payer: Self-pay

## 2018-11-13 DIAGNOSIS — Z01818 Encounter for other preprocedural examination: Secondary | ICD-10-CM

## 2018-11-13 DIAGNOSIS — I517 Cardiomegaly: Secondary | ICD-10-CM | POA: Diagnosis not present

## 2018-11-13 HISTORY — DX: Pectus excavatum: Q67.6

## 2018-11-13 HISTORY — DX: Other specified personal risk factors, not elsewhere classified: Z91.89

## 2018-11-13 HISTORY — DX: Unspecified iridocyclitis: H20.9

## 2018-11-13 HISTORY — DX: Unspecified osteoarthritis, unspecified site: M19.90

## 2018-11-13 HISTORY — DX: Polyneuropathy, unspecified: G62.9

## 2018-11-13 HISTORY — DX: Gastro-esophageal reflux disease without esophagitis: K21.9

## 2018-11-13 HISTORY — DX: Lateral epicondylitis, right elbow: M77.11

## 2018-11-13 HISTORY — DX: Other cervical disc degeneration, unspecified cervical region: M50.30

## 2018-11-13 HISTORY — DX: Unspecified symptoms and signs involving the genitourinary system: R39.9

## 2018-11-13 HISTORY — DX: Presence of spectacles and contact lenses: Z97.3

## 2018-11-13 HISTORY — DX: Personal history of other specified conditions: Z87.898

## 2018-11-13 HISTORY — DX: Personal history of other diseases of the circulatory system: Z86.79

## 2018-11-13 HISTORY — DX: Other chronic pain: G89.29

## 2018-11-13 HISTORY — DX: Pain in unspecified joint: M25.50

## 2018-11-13 HISTORY — DX: Cervicalgia: M54.2

## 2018-11-13 HISTORY — DX: Achilles tendinitis, left leg: M76.62

## 2018-11-13 LAB — BASIC METABOLIC PANEL
Anion gap: 11 (ref 5–15)
BUN: 18 mg/dL (ref 8–23)
CHLORIDE: 105 mmol/L (ref 98–111)
CO2: 22 mmol/L (ref 22–32)
Calcium: 9.1 mg/dL (ref 8.9–10.3)
Creatinine, Ser: 0.93 mg/dL (ref 0.61–1.24)
GFR calc Af Amer: >60 mL/min (ref >60)
GFR calc non Af Amer: >60 mL/min (ref >60)
Glucose, Bld: 110 mg/dL — ABNORMAL HIGH (ref 70–99)
POTASSIUM: 4 mmol/L (ref 3.5–5.1)
Sodium: 138 mmol/L (ref 135–145)

## 2018-11-13 LAB — CBC
HEMATOCRIT: 45 % (ref 39.0–52.0)
HEMOGLOBIN: 15.2 g/dL (ref 13.0–17.0)
MCH: 30 pg (ref 26.0–34.0)
MCHC: 33.8 g/dL (ref 30.0–36.0)
MCV: 88.9 fL (ref 80.0–100.0)
Platelets: 245 10*3/uL (ref 150–400)
RBC: 5.06 MIL/uL (ref 4.22–5.81)
RDW: 13.2 % (ref 11.5–15.5)
WBC: 7.7 10*3/uL (ref 4.0–10.5)
nRBC: 0 % (ref 0.0–0.2)

## 2018-11-13 NOTE — Progress Notes (Signed)
   11/13/18 1152  OBSTRUCTIVE SLEEP APNEA  Have you ever been diagnosed with sleep apnea through a sleep study? No  Do you snore loudly (loud enough to be heard through closed doors)?  1  Do you often feel tired, fatigued, or sleepy during the daytime (such as falling asleep during driving or talking to someone)? 0  Has anyone observed you stop breathing during your sleep? 1  Do you have, or are you being treated for high blood pressure? 1  BMI more than 35 kg/m2? 0  Age > 86 (1-yes) 1  Male Gender (Yes=1) 1  Obstructive Sleep Apnea Score 5

## 2018-11-13 NOTE — Progress Notes (Addendum)
Pt has hx congenital pectus excavatum, no chest xray found in epic.  Per Konrad Felix PA, anesthesia, order chest xray.  ADDENDUM:  Final EKG and CXR dated 11-13-2018 in epic.

## 2018-11-15 ENCOUNTER — Other Ambulatory Visit (HOSPITAL_COMMUNITY): Payer: Self-pay | Admitting: *Deleted

## 2018-11-15 NOTE — H&P (Signed)
CC/HPI: CC: Prostate Cancer    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 His PSA continued to be elevated prompting an MRI of the prostate in January 2019 that was unremarkable. Due to further elevation of his PSA up to 5.73, he underwent a repeat TRUS biopsy of the prostate on 08/09/18 that confirmed Gleason 3+4=7 adenocarcinoma with 2 out of 12 biopsy cores positive for malignancy. He has been counseled thoroughly by Dr. Jeffie Pollock and Dr. Tammi Klippel. He had expressed interest in brachytherapy but was felt to be a poor candidate due to severe LUTS that have been minimally responsive to medical therapy. He also had significant dizziness and congestion with alpha-blocker therapy.   Family history: None.   Imaging studies: MRI (Jan 2019) - No SVI, EPE, or LAD.   PMH: He has a history of rheumatoid arthritis (previously treated with Remicade but off now), GERD, hypertension, and hyperlipidemia. 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 He is currently taking prednisone for his rheumatoid arthritis at a dose of 10 mg daily.  PSH: No abdominal surgeries.   TNM stage: cT1c N0 Mx  PSA: 5.73  Gleason score: 3+4=7  Biopsy (08/09/18): 2/12 cores positive  Left: L lateral mid (20%, 3+4=7)  Right: R lateral apex (30%, 3+3=6, PNI)  Prostate volume: 27.0 cc   Nomogram  OC disease: 54%  EPE: 45%  SVI: 2%  LNI: 2%  PFS (5 year, 10 year): 88%, 80%   Urinary function: IPSS is 20.  Erectile function: SHIM score is 21. He does have pain with orgasm/climax. This has been ongoing for the last few years and causes pain throughout his right inguinal region and his penile shaft.     ALLERGIES: gabapentin Mobic TABS NSAIDs    MEDICATIONS: Prilosec  Rapaflo 8 mg capsule  Ambien 10 MG Oral Tablet Oral  DilTIAZem HCl - 90 MG Oral Tablet Oral  Inflectra  Prednisone PRN  Silodosin 8 mg capsule 1 capsule PO  Daily  Tramadol Hcl PRN  Zocor 80 mg tablet Oral     GU PSH: Complex Uroflow - 10/31/2017 Prostate Needle Biopsy - 08/09/2018 Vasectomy - 2012      PSH Notes: Cataract Surgery, Hand Surgery, Arthroplasty With Prosthesis Trapezium, Wrist Arthroscopy With Release Of Transverse Carpal Ligament, Surgery Of Male Genitalia Vasectomy, Surgery Testis, Neck Surgery, Tonsillectomy   NON-GU PSH: Carpal tunnel surgery - 2015 Hand/finger Surgery, Right Reconstruct Wrist Joint - 2015 Remove Tonsils - 2012 Surgical Pathology, Gross And Microscopic Examination For Prostate Needle - 08/09/2018    GU PMH: Prostate Cancer, He has seen Dr. Tammi Klippel and is felt to be a better candidate for EXRT than seeds because of his LUTS. I discussed the options with him and will have him see Dr. Alinda Money since I think surgery would be a good option for him with his LUTS. - 09/18/2018, He has favorable intermediate risk T1c Nx Mx prostate cancer. I discussed AS, RP, EXRT, Brachytherapy, Cryo and HIFU as noted below. I also discussed [place]. I think he is a good candidate for surgical therapy particularly with his voiding symptoms but he would like to see Dr. Tammi Klippel to discuss a seed implant. , - 08/23/2018 Elevated PSA - 08/09/2018, (Worsening), His PSA is back up again with a low f/t ratio. I am going to set him up for a repeat biopsy. He will need clearance from Dr. Amil Amen regarding the  Inflectra dosing. I sent Levaquin. , - 07/10/2018 (Worsening), His PSA has jumped up. I am going to repeat it next week after a week of abstinence and if it is still elevated, I will get an MRI and consider repeat biopsy. , - 08/31/2017 (Improving), His PSA is down some. I will repeat in 6 months., - 2018 (Stable), minimal change at 4.52, - 2017, Elevated prostate specific antigen (PSA), - 2016 Unil Inguinal Hernia W/O obst or gang,non-recurrent, Right, possible small RIH. - 2018 BPH w/LUTS, Benign prostatic hyperplasia with urinary obstruction -  2016 Weak Urinary Stream, Weak urinary stream - 2016 Nocturia, Nocturia - 2015 Post-void dribbling, Post-void dribbling - 2014      PMH Notes:   1   NON-GU PMH: Spinal stenosis, site unspecified, Spinal Stenosis - 2014 GERD Hypercholesterolemia Hypertension Rheumatoid Arthritis    FAMILY HISTORY: Aortic Stenosis - Father Death In The Family Father - [place] In Family Death In The Family Mother - [place] In Family Family Health Status Number - [place] In Family Hypertension - Father, Mother Pure Hypercholesterolemia - Father, Mother renal failure - Father   SOCIAL HISTORY: Marital Status: Married Preferred Language: English; Race: White Current Smoking Status: Patient does not smoke anymore.   Tobacco Use Assessment Completed: Used Tobacco in last 30 days? 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    REVIEW OF SYSTEMS:    GU Review Male:   Patient denies frequent urination, hard to postpone urination, burning/ pain with urination, get up at night to urinate, leakage of urine, stream starts and stops, trouble starting your streams, and have to strain to urinate .  Gastrointestinal (Lower):   Patient denies diarrhea and constipation.  Gastrointestinal (Upper):   Patient denies nausea and vomiting.  Constitutional:   Patient denies fever, night sweats, weight loss, and fatigue.  Skin:   Patient denies skin rash/ lesion and itching.  Eyes:   Patient denies blurred vision and double vision.  Ears/ Nose/ Throat:   Patient denies sore throat and sinus problems.  Hematologic/Lymphatic:   Patient denies easy bruising and swollen glands.  Cardiovascular:   Patient denies leg swelling and chest pains.  Respiratory:   Patient denies cough and shortness of breath.  Endocrine:   Patient denies excessive thirst.  Musculoskeletal:   Patient denies back pain and joint pain.  Neurological:   Patient denies headaches and dizziness.  Psychologic:   Patient denies depression and anxiety.   VITAL  SIGNS:     Weight 210 lb / 95.25 kg  Height 66 in / 167.64 cm   BMI 33.9 kg/m   MULTI-SYSTEM PHYSICAL EXAMINATION:    Constitutional: Well-nourished. No physical deformities. Normally developed. Good grooming.  Neck: Neck symmetrical, not swollen. Normal tracheal position.  Respiratory: No labored breathing, no use of accessory muscles. Clear bilaterally.  Cardiovascular: Normal temperature, normal extremity pulses, no swelling, no varicosities. Regular rate and rhythm.  Lymphatic: No enlargement of neck, axillae, groin.  Skin: No paleness, no jaundice, no cyanosis. No lesion, no ulcer, no rash.  Neurologic / Psychiatric: Oriented to time, oriented to place, oriented to person. No depression, no anxiety, no agitation.  Gastrointestinal: No mass, no tenderness, no rigidity, non obese abdomen.  Eyes: Normal conjunctivae. Normal eyelids.  Ears, Nose, Mouth, and Throat: Left ear no scars, no lesions, no masses. Right ear no scars, no lesions, no masses. Nose no scars, no lesions, no masses. Normal hearing. Normal lips.  Musculoskeletal: Normal gait and station of head and neck.  ASSESSMENT:      ICD-10 Details  1 GU:   Prostate Cancer - C61    PLAN:      1. Favorable intermediate risk prostate cancer: He has elected surgical therapy and will undergo a bilateral nerve-sparing robot assisted laparoscopic radical prostatectomy and bilateral pelvic lymphadenectomy.

## 2018-11-15 NOTE — Anesthesia Preprocedure Evaluation (Addendum)
Anesthesia Evaluation  Patient identified by MRN, date of birth, ID band Patient awake    Reviewed: Allergy & Precautions, NPO status , Patient's Chart, lab work & pertinent test results  Airway Mallampati: II  TM Distance: >3 FB Neck ROM: Full    Dental  (+) Dental Advisory Given   Pulmonary neg pulmonary ROS, former smoker,    Pulmonary exam normal breath sounds clear to auscultation       Cardiovascular hypertension, negative cardio ROS Normal cardiovascular exam Rhythm:Regular Rate:Normal     Neuro/Psych negative psych ROS   GI/Hepatic negative GI ROS, Neg liver ROS,   Endo/Other  negative endocrine ROS  Renal/GU negative Renal ROS     Musculoskeletal  (+) Arthritis ,   Abdominal (+) - obese,   Peds  Hematology negative hematology ROS (+)   Anesthesia Other Findings   Reproductive/Obstetrics                            Anesthesia Physical Anesthesia Plan  ASA: II  Anesthesia Plan: General   Post-op Pain Management:    Induction: Intravenous  PONV Risk Score and Plan: 4 or greater and Ondansetron, Dexamethasone and Treatment may vary due to age or medical condition  Airway Management Planned: Oral ETT  Additional Equipment: None  Intra-op Plan:   Post-operative Plan: Extubation in OR  Informed Consent: I have reviewed the patients History and Physical, chart, labs and discussed the procedure including the risks, benefits and alternatives for the proposed anesthesia with the patient or authorized representative who has indicated his/her understanding and acceptance.     Dental advisory given  Plan Discussed with: CRNA  Anesthesia Plan Comments:         Anesthesia Quick Evaluation

## 2018-11-16 ENCOUNTER — Other Ambulatory Visit: Payer: Self-pay

## 2018-11-16 ENCOUNTER — Ambulatory Visit (HOSPITAL_COMMUNITY): Payer: Medicare Other | Admitting: Physician Assistant

## 2018-11-16 ENCOUNTER — Observation Stay (HOSPITAL_COMMUNITY)
Admission: RE | Admit: 2018-11-16 | Discharge: 2018-11-17 | Disposition: A | Discharge status: Home / Self Care | Payer: Medicare Other | Attending: Urology | Admitting: Urology

## 2018-11-16 ENCOUNTER — Encounter (HOSPITAL_COMMUNITY)
Admission: RE | Disposition: A | Discharge status: Home / Self Care | Payer: Self-pay | Source: Home / Self Care | Attending: Urology

## 2018-11-16 ENCOUNTER — Ambulatory Visit (HOSPITAL_COMMUNITY): Payer: Medicare Other | Admitting: Anesthesiology

## 2018-11-16 ENCOUNTER — Encounter (HOSPITAL_COMMUNITY): Payer: Self-pay | Admitting: Emergency Medicine

## 2018-11-16 DIAGNOSIS — Z888 Allergy status to other drugs, medicaments and biological substances status: Secondary | ICD-10-CM | POA: Diagnosis not present

## 2018-11-16 DIAGNOSIS — N401 Enlarged prostate with lower urinary tract symptoms: Secondary | ICD-10-CM | POA: Insufficient documentation

## 2018-11-16 DIAGNOSIS — Z8249 Family history of ischemic heart disease and other diseases of the circulatory system: Secondary | ICD-10-CM | POA: Diagnosis not present

## 2018-11-16 DIAGNOSIS — Z9849 Cataract extraction status, unspecified eye: Secondary | ICD-10-CM | POA: Diagnosis not present

## 2018-11-16 DIAGNOSIS — J45998 Other asthma: Secondary | ICD-10-CM | POA: Insufficient documentation

## 2018-11-16 DIAGNOSIS — E7800 Pure hypercholesterolemia, unspecified: Secondary | ICD-10-CM | POA: Diagnosis not present

## 2018-11-16 DIAGNOSIS — E785 Hyperlipidemia, unspecified: Secondary | ICD-10-CM | POA: Diagnosis not present

## 2018-11-16 DIAGNOSIS — Q676 Pectus excavatum: Secondary | ICD-10-CM | POA: Diagnosis not present

## 2018-11-16 DIAGNOSIS — I10 Essential (primary) hypertension: Secondary | ICD-10-CM | POA: Insufficient documentation

## 2018-11-16 DIAGNOSIS — Z841 Family history of disorders of kidney and ureter: Secondary | ICD-10-CM | POA: Diagnosis not present

## 2018-11-16 DIAGNOSIS — C61 Malignant neoplasm of prostate: Secondary | ICD-10-CM | POA: Diagnosis not present

## 2018-11-16 DIAGNOSIS — M069 Rheumatoid arthritis, unspecified: Secondary | ICD-10-CM | POA: Insufficient documentation

## 2018-11-16 DIAGNOSIS — Z79899 Other long term (current) drug therapy: Secondary | ICD-10-CM | POA: Insufficient documentation

## 2018-11-16 DIAGNOSIS — K219 Gastro-esophageal reflux disease without esophagitis: Secondary | ICD-10-CM | POA: Diagnosis not present

## 2018-11-16 HISTORY — PX: LYMPHADENECTOMY: SHX5960

## 2018-11-16 HISTORY — PX: ROBOT ASSISTED LAPAROSCOPIC RADICAL PROSTATECTOMY: SHX5141

## 2018-11-16 LAB — HEMOGLOBIN AND HEMATOCRIT, BLOOD
HCT: 40.2 % (ref 39.0–52.0)
Hemoglobin: 13 g/dL (ref 13.0–17.0)

## 2018-11-16 SURGERY — XI ROBOTIC ASSISTED LAPAROSCOPIC RADICAL PROSTATECTOMY LEVEL 2
Anesthesia: General

## 2018-11-16 MED ORDER — CEFAZOLIN SODIUM-DEXTROSE 1-4 GM/50ML-% IV SOLN
1.0000 g | Freq: Three times a day (TID) | INTRAVENOUS | Status: AC
  Administered 2018-11-16 (×2): 1 g via INTRAVENOUS
  Filled 2018-11-16 (×2): qty 50

## 2018-11-16 MED ORDER — LACTATED RINGERS IV SOLN
INTRAVENOUS | Status: DC | PRN
  Administered 2018-11-16: 09:00:00

## 2018-11-16 MED ORDER — BUPIVACAINE-EPINEPHRINE (PF) 0.25% -1:200000 IJ SOLN
INTRAMUSCULAR | Status: AC
  Filled 2018-11-16: qty 30

## 2018-11-16 MED ORDER — LACTATED RINGERS IV SOLN
INTRAVENOUS | Status: DC
  Administered 2018-11-16: 06:00:00 via INTRAVENOUS

## 2018-11-16 MED ORDER — ROCURONIUM BROMIDE 100 MG/10ML IV SOLN
INTRAVENOUS | Status: AC
  Filled 2018-11-16: qty 1

## 2018-11-16 MED ORDER — PROMETHAZINE HCL 25 MG/ML IJ SOLN: 6.2500 mg | INTRAMUSCULAR | Status: DC | PRN

## 2018-11-16 MED ORDER — HEPARIN SODIUM (PORCINE) 1000 UNIT/ML IJ SOLN
INTRAMUSCULAR | Status: AC
  Filled 2018-11-16: qty 1

## 2018-11-16 MED ORDER — DEXAMETHASONE SODIUM PHOSPHATE 10 MG/ML IJ SOLN
INTRAMUSCULAR | Status: DC | PRN
  Administered 2018-11-16: 4 mg via INTRAVENOUS

## 2018-11-16 MED ORDER — HYDROMORPHONE HCL 1 MG/ML IJ SOLN
INTRAMUSCULAR | Status: AC
  Filled 2018-11-16: qty 1

## 2018-11-16 MED ORDER — LIDOCAINE 2% (20 MG/ML) 5 ML SYRINGE
INTRAMUSCULAR | Status: AC
  Filled 2018-11-16: qty 5

## 2018-11-16 MED ORDER — MIDAZOLAM HCL 2 MG/2ML IJ SOLN
INTRAMUSCULAR | Status: AC
  Filled 2018-11-16: qty 2

## 2018-11-16 MED ORDER — FLEET ENEMA 7-19 GM/118ML RE ENEM: 1.0000 | ENEMA | Freq: Once | RECTAL | Status: DC

## 2018-11-16 MED ORDER — HYDROCODONE-ACETAMINOPHEN 10-325 MG PO TABS: 1.0000 | ORAL_TABLET | Freq: Four times a day (QID) | ORAL | 0 refills | Status: DC | PRN

## 2018-11-16 MED ORDER — FLUTICASONE PROPIONATE 50 MCG/ACT NA SUSP
1.0000 | Freq: Every day | NASAL | Status: DC | PRN
  Filled 2018-11-16: qty 16

## 2018-11-16 MED ORDER — SUGAMMADEX SODIUM 200 MG/2ML IV SOLN
INTRAVENOUS | Status: AC
  Filled 2018-11-16: qty 2

## 2018-11-16 MED ORDER — PROPOFOL 10 MG/ML IV BOLUS
INTRAVENOUS | Status: DC | PRN
  Administered 2018-11-16: 200 mg via INTRAVENOUS

## 2018-11-16 MED ORDER — LIDOCAINE 2% (20 MG/ML) 5 ML SYRINGE
INTRAMUSCULAR | Status: DC | PRN
  Administered 2018-11-16: 50 mg via INTRAVENOUS

## 2018-11-16 MED ORDER — ONDANSETRON HCL 4 MG/2ML IJ SOLN: 4.0000 mg | INTRAMUSCULAR | Status: DC | PRN

## 2018-11-16 MED ORDER — MEPERIDINE HCL 50 MG/ML IJ SOLN: 6.2500 mg | INTRAMUSCULAR | Status: DC | PRN

## 2018-11-16 MED ORDER — MIDAZOLAM HCL 2 MG/2ML IJ SOLN
INTRAMUSCULAR | Status: DC | PRN
  Administered 2018-11-16: 2 mg via INTRAVENOUS

## 2018-11-16 MED ORDER — ZOLPIDEM TARTRATE 5 MG PO TABS: 5.0000 mg | ORAL_TABLET | Freq: Every evening | ORAL | Status: DC | PRN

## 2018-11-16 MED ORDER — PANTOPRAZOLE SODIUM 40 MG PO TBEC
40.0000 mg | DELAYED_RELEASE_TABLET | Freq: Every day | ORAL | Status: DC
  Administered 2018-11-17: 40 mg via ORAL
  Filled 2018-11-16: qty 1

## 2018-11-16 MED ORDER — FENTANYL CITRATE (PF) 250 MCG/5ML IJ SOLN
INTRAMUSCULAR | Status: AC
  Filled 2018-11-16: qty 5

## 2018-11-16 MED ORDER — ONDANSETRON HCL 4 MG/2ML IJ SOLN
INTRAMUSCULAR | Status: AC
  Filled 2018-11-16: qty 2

## 2018-11-16 MED ORDER — DOCUSATE SODIUM 100 MG PO CAPS
100.0000 mg | ORAL_CAPSULE | Freq: Two times a day (BID) | ORAL | Status: DC
  Administered 2018-11-16 – 2018-11-17 (×2): 100 mg via ORAL
  Filled 2018-11-16 (×2): qty 1

## 2018-11-16 MED ORDER — KCL IN DEXTROSE-NACL 20-5-0.45 MEQ/L-%-% IV SOLN
INTRAVENOUS | Status: DC
  Administered 2018-11-16 – 2018-11-17 (×2): via INTRAVENOUS
  Filled 2018-11-16 (×4): qty 1000

## 2018-11-16 MED ORDER — FENTANYL CITRATE (PF) 250 MCG/5ML IJ SOLN
INTRAMUSCULAR | Status: DC | PRN
  Administered 2018-11-16: 25 ug via INTRAVENOUS
  Administered 2018-11-16 (×2): 50 ug via INTRAVENOUS
  Administered 2018-11-16: 25 ug via INTRAVENOUS
  Administered 2018-11-16: 100 ug via INTRAVENOUS

## 2018-11-16 MED ORDER — ACETAMINOPHEN 10 MG/ML IV SOLN
1000.0000 mg | Freq: Four times a day (QID) | INTRAVENOUS | Status: AC
  Administered 2018-11-16 – 2018-11-17 (×4): 1000 mg via INTRAVENOUS
  Filled 2018-11-16 (×4): qty 100

## 2018-11-16 MED ORDER — HYDROMORPHONE HCL 1 MG/ML IJ SOLN
0.2500 mg | INTRAMUSCULAR | Status: DC | PRN
  Administered 2018-11-16 (×3): 0.5 mg via INTRAVENOUS

## 2018-11-16 MED ORDER — CEFAZOLIN SODIUM-DEXTROSE 2-4 GM/100ML-% IV SOLN
2.0000 g | Freq: Once | INTRAVENOUS | Status: AC
  Administered 2018-11-16: 2 g via INTRAVENOUS
  Filled 2018-11-16: qty 100

## 2018-11-16 MED ORDER — ONDANSETRON HCL 4 MG/2ML IJ SOLN
INTRAMUSCULAR | Status: DC | PRN
  Administered 2018-11-16: 4 mg via INTRAVENOUS

## 2018-11-16 MED ORDER — SODIUM CHLORIDE 0.9 % IR SOLN
Status: DC | PRN
  Administered 2018-11-16: 1000 mL via INTRAVESICAL

## 2018-11-16 MED ORDER — PREDNISONE 5 MG PO TABS
5.0000 mg | ORAL_TABLET | Freq: Every day | ORAL | Status: DC
  Administered 2018-11-17: 5 mg via ORAL
  Filled 2018-11-16: qty 1

## 2018-11-16 MED ORDER — DIPHENHYDRAMINE HCL 12.5 MG/5ML PO ELIX: 12.5000 mg | ORAL_SOLUTION | Freq: Four times a day (QID) | ORAL | Status: DC | PRN

## 2018-11-16 MED ORDER — SODIUM CHLORIDE 0.9 % IV BOLUS
1000.0000 mL | Freq: Once | INTRAVENOUS | Status: AC
  Administered 2018-11-16: 1000 mL via INTRAVENOUS

## 2018-11-16 MED ORDER — BACITRACIN-NEOMYCIN-POLYMYXIN 400-5-5000 EX OINT: 1.0000 "application " | TOPICAL_OINTMENT | Freq: Three times a day (TID) | CUTANEOUS | Status: DC | PRN

## 2018-11-16 MED ORDER — KETAMINE HCL 10 MG/ML IJ SOLN
INTRAMUSCULAR | Status: DC | PRN
  Administered 2018-11-16 (×2): 20 mg via INTRAVENOUS

## 2018-11-16 MED ORDER — FAMOTIDINE 20 MG PO TABS: 20.0000 mg | ORAL_TABLET | Freq: Every day | ORAL | Status: DC | PRN

## 2018-11-16 MED ORDER — DILTIAZEM HCL 90 MG PO TABS
90.0000 mg | ORAL_TABLET | Freq: Every day | ORAL | Status: DC
  Administered 2018-11-16: 90 mg via ORAL
  Filled 2018-11-16: qty 1

## 2018-11-16 MED ORDER — KETAMINE HCL 10 MG/ML IJ SOLN
INTRAMUSCULAR | Status: AC
  Filled 2018-11-16: qty 1

## 2018-11-16 MED ORDER — SUGAMMADEX SODIUM 200 MG/2ML IV SOLN
INTRAVENOUS | Status: DC | PRN
  Administered 2018-11-16: 200 mg via INTRAVENOUS

## 2018-11-16 MED ORDER — SULFAMETHOXAZOLE-TRIMETHOPRIM 800-160 MG PO TABS: 1.0000 | ORAL_TABLET | Freq: Two times a day (BID) | ORAL | 0 refills | Status: DC

## 2018-11-16 MED ORDER — DIPHENHYDRAMINE HCL 50 MG/ML IJ SOLN: 12.5000 mg | Freq: Four times a day (QID) | INTRAMUSCULAR | Status: DC | PRN

## 2018-11-16 MED ORDER — BUPIVACAINE-EPINEPHRINE (PF) 0.25% -1:200000 IJ SOLN
INTRAMUSCULAR | Status: DC | PRN
  Administered 2018-11-16: 30 mL

## 2018-11-16 MED ORDER — MAGNESIUM CITRATE PO SOLN: 1.0000 | Freq: Once | ORAL | Status: DC

## 2018-11-16 MED ORDER — ATORVASTATIN CALCIUM 40 MG PO TABS
40.0000 mg | ORAL_TABLET | Freq: Every day | ORAL | Status: DC
  Administered 2018-11-16: 40 mg via ORAL
  Filled 2018-11-16: qty 1

## 2018-11-16 MED ORDER — BELLADONNA ALKALOIDS-OPIUM 16.2-60 MG RE SUPP
1.0000 | Freq: Four times a day (QID) | RECTAL | Status: DC | PRN
  Administered 2018-11-16 (×2): 1 via RECTAL
  Filled 2018-11-16 (×2): qty 1

## 2018-11-16 MED ORDER — ROCURONIUM BROMIDE 10 MG/ML (PF) SYRINGE
PREFILLED_SYRINGE | INTRAVENOUS | Status: DC | PRN
  Administered 2018-11-16: 5 mg via INTRAVENOUS
  Administered 2018-11-16 (×2): 10 mg via INTRAVENOUS
  Administered 2018-11-16: 50 mg via INTRAVENOUS

## 2018-11-16 MED ORDER — PROPOFOL 10 MG/ML IV BOLUS
INTRAVENOUS | Status: AC
  Filled 2018-11-16: qty 40

## 2018-11-16 MED ORDER — DEXAMETHASONE SODIUM PHOSPHATE 10 MG/ML IJ SOLN
INTRAMUSCULAR | Status: AC
  Filled 2018-11-16: qty 1

## 2018-11-16 MED ORDER — PHENYLEPHRINE HCL 10 MG/ML IJ SOLN
INTRAMUSCULAR | Status: AC
  Filled 2018-11-16: qty 1

## 2018-11-16 MED ORDER — MORPHINE SULFATE (PF) 2 MG/ML IV SOLN
2.0000 mg | INTRAVENOUS | Status: DC | PRN
  Administered 2018-11-16: 4 mg via INTRAVENOUS
  Administered 2018-11-16 (×2): 2 mg via INTRAVENOUS
  Administered 2018-11-16: 4 mg via INTRAVENOUS
  Administered 2018-11-16 (×2): 2 mg via INTRAVENOUS
  Administered 2018-11-17: 4 mg via INTRAVENOUS
  Administered 2018-11-17: 2 mg via INTRAVENOUS
  Filled 2018-11-16 (×2): qty 1
  Filled 2018-11-16: qty 2
  Filled 2018-11-16: qty 1
  Filled 2018-11-16 (×2): qty 2
  Filled 2018-11-16 (×2): qty 1

## 2018-11-16 SURGICAL SUPPLY — 55 items
APPLICATOR COTTON TIP 6 STRL (MISCELLANEOUS) ×2 IMPLANT
APPLICATOR COTTON TIP 6IN STRL (MISCELLANEOUS) ×4
CATH FOLEY 2WAY SLVR 18FR 30CC (CATHETERS) ×4 IMPLANT
CATH ROBINSON RED A/P 16FR (CATHETERS) ×4 IMPLANT
CATH ROBINSON RED A/P 8FR (CATHETERS) ×4 IMPLANT
CATH TIEMANN FOLEY 18FR 5CC (CATHETERS) ×4 IMPLANT
CHLORAPREP W/TINT 26ML (MISCELLANEOUS) ×4 IMPLANT
CLIP VESOLOCK LG 6/CT PURPLE (CLIP) ×16 IMPLANT
COVER SURGICAL LIGHT HANDLE (MISCELLANEOUS) ×4 IMPLANT
COVER TIP SHEARS 8 DVNC (MISCELLANEOUS) ×2 IMPLANT
COVER TIP SHEARS 8MM DA VINCI (MISCELLANEOUS) ×2
COVER WAND RF STERILE (DRAPES) IMPLANT
CUTTER ECHEON FLEX ENDO 45 340 (ENDOMECHANICALS) ×4 IMPLANT
DECANTER SPIKE VIAL GLASS SM (MISCELLANEOUS) IMPLANT
DERMABOND ADVANCED (GAUZE/BANDAGES/DRESSINGS) ×2
DERMABOND ADVANCED .7 DNX12 (GAUZE/BANDAGES/DRESSINGS) ×2 IMPLANT
DRAPE ARM DVNC X/XI (DISPOSABLE) ×8 IMPLANT
DRAPE COLUMN DVNC XI (DISPOSABLE) ×2 IMPLANT
DRAPE DA VINCI XI ARM (DISPOSABLE) ×8
DRAPE DA VINCI XI COLUMN (DISPOSABLE) ×2
DRAPE SURG IRRIG POUCH 19X23 (DRAPES) ×4 IMPLANT
DRSG TEGADERM 4X4.75 (GAUZE/BANDAGES/DRESSINGS) ×4 IMPLANT
ELECT REM PT RETURN 15FT ADLT (MISCELLANEOUS) ×4 IMPLANT
GLOVE BIO SURGEON STRL SZ 6.5 (GLOVE) ×3 IMPLANT
GLOVE BIO SURGEONS STRL SZ 6.5 (GLOVE) ×1
GLOVE BIOGEL M STRL SZ7.5 (GLOVE) ×8 IMPLANT
GOWN STRL REUS W/TWL LRG LVL3 (GOWN DISPOSABLE) ×12 IMPLANT
HOLDER FOLEY CATH W/STRAP (MISCELLANEOUS) ×4 IMPLANT
IRRIG SUCT STRYKERFLOW 2 WTIP (MISCELLANEOUS) ×4
IRRIGATION SUCT STRKRFLW 2 WTP (MISCELLANEOUS) ×2 IMPLANT
IV LACTATED RINGERS 1000ML (IV SOLUTION) ×4 IMPLANT
NDL SAFETY ECLIPSE 18X1.5 (NEEDLE) ×2 IMPLANT
NEEDLE HYPO 18GX1.5 SHARP (NEEDLE) ×2
PACK ROBOT UROLOGY CUSTOM (CUSTOM PROCEDURE TRAY) ×4 IMPLANT
SEAL CANN UNIV 5-8 DVNC XI (MISCELLANEOUS) ×8 IMPLANT
SEAL XI 5MM-8MM UNIVERSAL (MISCELLANEOUS) ×8
SOLUTION ELECTROLUBE (MISCELLANEOUS) ×4 IMPLANT
STAPLE RELOAD 45 GRN (STAPLE) ×2 IMPLANT
STAPLE RELOAD 45MM GREEN (STAPLE) ×2
SUT ETHILON 3 0 PS 1 (SUTURE) ×4 IMPLANT
SUT MNCRL 3 0 RB1 (SUTURE) ×2 IMPLANT
SUT MNCRL 3 0 VIOLET RB1 (SUTURE) ×2 IMPLANT
SUT MNCRL AB 4-0 PS2 18 (SUTURE) ×8 IMPLANT
SUT MONOCRYL 3 0 RB1 (SUTURE) ×4
SUT VIC AB 0 CT1 27 (SUTURE) ×2
SUT VIC AB 0 CT1 27XBRD ANTBC (SUTURE) ×2 IMPLANT
SUT VIC AB 0 UR5 27 (SUTURE) ×4 IMPLANT
SUT VIC AB 2-0 SH 27 (SUTURE) ×2
SUT VIC AB 2-0 SH 27X BRD (SUTURE) ×2 IMPLANT
SUT VICRYL 0 UR6 27IN ABS (SUTURE) ×8 IMPLANT
SYR 27GX1/2 1ML LL SAFETY (SYRINGE) ×4 IMPLANT
TOWEL OR 17X26 10 PK STRL BLUE (TOWEL DISPOSABLE) ×4 IMPLANT
TOWEL OR NON WOVEN STRL DISP B (DISPOSABLE) ×4 IMPLANT
TUBING INSUFFLATION 10FT LAP (TUBING) IMPLANT
WATER STERILE IRR 1000ML POUR (IV SOLUTION) ×4 IMPLANT

## 2018-11-16 NOTE — Anesthesia Procedure Notes (Signed)
Procedure Name: Intubation Date/Time: 11/16/2018 7:28 AM Performed by: Eben Burow, CRNA Pre-anesthesia Checklist: Patient identified, Emergency Drugs available, Suction available, Patient being monitored and Timeout performed Patient Re-evaluated:Patient Re-evaluated prior to induction Oxygen Delivery Method: Circle system utilized Preoxygenation: Pre-oxygenation with 100% oxygen Induction Type: IV induction Ventilation: Mask ventilation without difficulty Laryngoscope Size: Mac and 4 Grade View: Grade II Tube type: Oral Tube size: 8.0 mm Number of attempts: 1 Airway Equipment and Method: Stylet Placement Confirmation: ETT inserted through vocal cords under direct vision,  positive ETCO2 and breath sounds checked- equal and bilateral Secured at: 23 cm Tube secured with: Tape Dental Injury: Teeth and Oropharynx as per pre-operative assessment

## 2018-11-16 NOTE — Progress Notes (Signed)
Patient ID: Isaiah Price, male   DOB: [birth year], 68 y.o.   MRN: [id number]  Post-op note  Subjective: The patient is doing well.  No complaints except bladder spasms.  Mild nausea has improved.   Objective: Vital signs in last 24 hours: Temp:  [97.3 F (36.3 C)-98.9 F (37.2 C)] 97.5 F (36.4 C) (01/30 1113) Pulse Rate:  [82-93] 87 (01/30 1113) Resp:  [10-18] 16 (01/30 1113) BP: (135-168)/(82-97) 168/95 (01/30 1113) SpO2:  [92 %-100 %] 100 % (01/30 1113) Weight:  [97.2 kg-98.8 kg] 98.8 kg (01/30 1114)  Intake/Output from previous day: No intake/output data recorded. Intake/Output this shift: Total I/O In: 1220 [I.V.:1120; IV Piggyback:100] Out: 420 [Urine:350; Drains:20; Blood:50]  Physical Exam:  General: Alert and oriented. Abdomen: Soft, Nondistended. Incisions: Clean and dry. Urine: yellow  Lab Results: Recent Labs    11/16/18 1030  HGB 13.0  HCT 40.2    Assessment/Plan: POD#0   1) Continue to monitor  2) DVT prophy, clears, IS, amb, pain control  3) B/O for spasms   LOS: 0 days   Debbrah Alar 11/16/2018, 2:27 PM

## 2018-11-16 NOTE — Op Note (Signed)

## 2018-11-16 NOTE — Discharge Instructions (Signed)

## 2018-11-16 NOTE — Anesthesia Postprocedure Evaluation (Signed)
Anesthesia Post Note  Patient: Isaiah Price  Procedure(s) Performed: XI ROBOTIC ASSISTED LAPAROSCOPIC RADICAL PROSTATECTOMY LEVEL 2 (N/A ) LYMPHADENECTOMY, PELVIC (Bilateral )     Patient location during evaluation: PACU Anesthesia Type: General Level of consciousness: sedated and patient cooperative Pain management: pain level controlled Vital Signs Assessment: post-procedure vital signs reviewed and stable Respiratory status: spontaneous breathing Cardiovascular status: stable Anesthetic complications: no    Last Vitals:  Vitals:   11/16/18 1035 11/16/18 1045  BP: (!) 147/88 (!) 149/90  Pulse: 82 83  Resp: 10 11  Temp:  (!) 36.3 C  SpO2: 92% 96%    Last Pain:  Vitals:   11/16/18 1045  TempSrc:   PainSc: Asleep                 Nolon Nations

## 2018-11-16 NOTE — Transfer of Care (Signed)
Immediate Anesthesia Transfer of Care Note  Patient: Isaiah Price  Procedure(s) Performed: XI ROBOTIC ASSISTED LAPAROSCOPIC RADICAL PROSTATECTOMY LEVEL 2 (N/A ) LYMPHADENECTOMY, PELVIC (Bilateral )  Patient Location: PACU  Anesthesia Type:General  Level of Consciousness: awake, alert  and oriented  Airway & Oxygen Therapy: Patient Spontanous Breathing and Patient connected to face mask oxygen  Post-op Assessment: Report given to RN and Post -op Vital signs reviewed and stable  Post vital signs: Reviewed and stable  Last Vitals:  Vitals Value Taken Time  BP 150/95 11/16/2018 10:00 AM  Temp    Pulse 85 11/16/2018 10:02 AM  Resp 13 11/16/2018 10:02 AM  SpO2 99 % 11/16/2018 10:02 AM  Vitals shown include unvalidated device data.  Last Pain:  Vitals:   11/16/18 0551  TempSrc:   PainSc: 0-No pain      Patients Stated Pain Goal: 4 (24/26/83 4196)  Complications: No apparent anesthesia complications

## 2018-11-17 ENCOUNTER — Encounter (HOSPITAL_COMMUNITY): Payer: Self-pay | Admitting: Urology

## 2018-11-17 DIAGNOSIS — M069 Rheumatoid arthritis, unspecified: Secondary | ICD-10-CM | POA: Diagnosis not present

## 2018-11-17 DIAGNOSIS — K219 Gastro-esophageal reflux disease without esophagitis: Secondary | ICD-10-CM | POA: Diagnosis not present

## 2018-11-17 DIAGNOSIS — E785 Hyperlipidemia, unspecified: Secondary | ICD-10-CM | POA: Diagnosis not present

## 2018-11-17 DIAGNOSIS — I10 Essential (primary) hypertension: Secondary | ICD-10-CM | POA: Diagnosis not present

## 2018-11-17 DIAGNOSIS — N401 Enlarged prostate with lower urinary tract symptoms: Secondary | ICD-10-CM | POA: Diagnosis not present

## 2018-11-17 DIAGNOSIS — C61 Malignant neoplasm of prostate: Secondary | ICD-10-CM | POA: Diagnosis not present

## 2018-11-17 LAB — HEMOGLOBIN AND HEMATOCRIT, BLOOD
HCT: 39.1 % (ref 39.0–52.0)
Hemoglobin: 12.8 g/dL — ABNORMAL LOW (ref 13.0–17.0)

## 2018-11-17 MED ORDER — HYDROCODONE-ACETAMINOPHEN 5-325 MG PO TABS
1.0000 | ORAL_TABLET | Freq: Four times a day (QID) | ORAL | Status: DC | PRN
  Administered 2018-11-17: 2 via ORAL
  Filled 2018-11-17: qty 2

## 2018-11-17 MED ORDER — BISACODYL 10 MG RE SUPP
10.0000 mg | Freq: Once | RECTAL | Status: AC
  Administered 2018-11-17: 10 mg via RECTAL
  Filled 2018-11-17: qty 1

## 2018-11-17 NOTE — Progress Notes (Signed)
Pt to be discharged to home this afternoon. Pt and wife given home Prostatectomy teaching reinforcing class teaching from the Prostatectomy class. Pt able to return demonstration of foley to leg bag. Medications also reviewed with Pt including New Medications and schedules. Pt and Wife verbalized understanding of all discharge teaching/instructions.

## 2018-11-17 NOTE — Discharge Summary (Signed)
  Date of admission: 11/16/2018  Date of discharge: 11/17/2018  Admission diagnosis: Prostate Cancer  Discharge diagnosis: Prostate Cancer  History and Physical: For full details, please see admission history and physical. Briefly, Isaiah Price is a 68 y.o. gentleman with localized prostate cancer.  After discussing management/treatment options, he elected to proceed with surgical treatment.  Hospital Course: Isaiah Price was taken to the operating room on 11/16/2018 and underwent a robotic assisted laparoscopic radical prostatectomy. He tolerated this procedure well and without complications. Postoperatively, he was able to be transferred to a regular hospital room following recovery from anesthesia.  He was able to begin ambulating the night of surgery. He remained hemodynamically stable overnight.  He had excellent urine output with appropriately minimal output from his pelvic drain and his pelvic drain was removed on POD #1.  He was transitioned to oral pain medication, tolerated a clear liquid diet, and had met all discharge criteria and was able to be discharged home later on POD#1.  Laboratory values:  Recent Labs    11/16/18 1030 11/17/18 0535  HGB 13.0 12.8*  HCT 40.2 39.1    Disposition: Home  Discharge instruction: He was instructed to be ambulatory but to refrain from heavy lifting, strenuous activity, or driving. He was instructed on urethral catheter care.  Discharge medications:   Allergies as of 11/17/2018      Reactions   Nsaids Other (See Comments)   Causes GI bleeds (numerous times)   Brimonidine Itching, Other (See Comments)   redness Other reaction(s): Abdominal Pain, Other (See Comments) redness   Gabapentin Other (See Comments)   Couldn't talk   Meloxicam Other (See Comments)      Medication List    STOP taking these medications   psyllium 58.6 % powder Commonly known as:  METAMUCIL   silodosin 8 MG Caps capsule Commonly known as:  RAPAFLO    traMADol 50 MG tablet Commonly known as:  ULTRAM     TAKE these medications   atorvastatin 40 MG tablet Commonly known as:  LIPITOR Take 40 mg by mouth at bedtime.   diltiazem 90 MG tablet Commonly known as:  CARDIZEM Take 90 mg by mouth at bedtime.   famotidine 20 MG tablet Commonly known as:  PEPCID Take 20 mg by mouth daily as needed for heartburn.   fluticasone 50 MCG/ACT nasal spray Commonly known as:  FLONASE Place 1 spray into the nose daily as needed for allergies.   HYDROcodone-acetaminophen 10-325 MG tablet Commonly known as:  NORCO Take 1 tablet by mouth every 6 (six) hours as needed for moderate pain or severe pain. What changed:  reasons to take this   omeprazole 40 MG capsule Commonly known as:  PRILOSEC Take 40 mg by mouth daily.   predniSONE 5 MG tablet Commonly known as:  DELTASONE Take 5 mg by mouth daily with breakfast.   REFRESH OP Place 1 drop into both eyes 2 (two) times daily as needed (dry eyes).   sulfamethoxazole-trimethoprim 800-160 MG tablet Commonly known as:  BACTRIM DS,SEPTRA DS Take 1 tablet by mouth 2 (two) times daily. Start the day prior to foley removal appointment   zolpidem 10 MG tablet Commonly known as:  AMBIEN Take 10 mg by mouth at bedtime.       Followup: He will followup in 1 week for catheter removal and to discuss his surgical pathology results.

## 2018-11-17 NOTE — Progress Notes (Signed)
Patient ID: Isaiah Price, male   DOB: 12-01-1950, 68 y.o.   MRN: [id number]  1 Day Post-Op Subjective: The patient is doing well.  No nausea or vomiting. Pain is adequately controlled.  Objective: Vital signs in last 24 hours: Temp:  [97.3 F (36.3 C)-98.5 F (36.9 C)] 98.3 F (36.8 C) (01/31 0603) Pulse Rate:  [76-87] 76 (01/31 0603) Resp:  [10-18] 18 (01/31 0603) BP: (146-168)/(80-97) 146/86 (01/31 0603) SpO2:  [92 %-100 %] 100 % (01/31 0603) Weight:  [98.8 kg] 98.8 kg (01/30 1114)  Intake/Output from previous day: 01/30 0701 - 01/31 0700 In: 2083.4 [I.V.:1633.4; IV Piggyback:450] Out: 6063 [Urine:4500; Drains:85; Blood:50] Intake/Output this shift: No intake/output data recorded.  Physical Exam:  General: Alert and oriented. CV: RRR Lungs: Clear bilaterally. GI: Soft, Nondistended. Incisions: Clean, dry, and intact Urine: Clear Extremities: Nontender, no erythema, no edema.  Lab Results: Recent Labs    11/16/18 1030 11/17/18 0535  HGB 13.0 12.8*  HCT 40.2 39.1      Assessment/Plan: POD# 1 s/p robotic prostatectomy.  1) SL IVF 2) Ambulate, Incentive spirometry 3) Transition to oral pain medication 4) Dulcolax suppository 5) D/C pelvic drain 6) Plan for likely discharge later today   Isaiah Price. MD   LOS: 0 days   Isaiah Price 11/17/2018, 7:45 AM

## 2018-12-06 DIAGNOSIS — Z808 Family history of malignant neoplasm of other organs or systems: Secondary | ICD-10-CM | POA: Diagnosis not present

## 2018-12-06 DIAGNOSIS — D225 Melanocytic nevi of trunk: Secondary | ICD-10-CM | POA: Diagnosis not present

## 2018-12-06 DIAGNOSIS — L821 Other seborrheic keratosis: Secondary | ICD-10-CM | POA: Diagnosis not present

## 2018-12-06 DIAGNOSIS — C61 Malignant neoplasm of prostate: Secondary | ICD-10-CM | POA: Diagnosis not present

## 2018-12-06 DIAGNOSIS — N393 Stress incontinence (female) (male): Secondary | ICD-10-CM | POA: Diagnosis not present

## 2018-12-06 DIAGNOSIS — R234 Changes in skin texture: Secondary | ICD-10-CM | POA: Diagnosis not present

## 2018-12-07 DIAGNOSIS — M542 Cervicalgia: Secondary | ICD-10-CM | POA: Diagnosis not present

## 2018-12-07 DIAGNOSIS — M7662 Achilles tendinitis, left leg: Secondary | ICD-10-CM | POA: Diagnosis not present

## 2018-12-07 DIAGNOSIS — M7711 Lateral epicondylitis, right elbow: Secondary | ICD-10-CM | POA: Diagnosis not present

## 2018-12-07 DIAGNOSIS — M25522 Pain in left elbow: Secondary | ICD-10-CM | POA: Diagnosis not present

## 2018-12-07 DIAGNOSIS — H209 Unspecified iridocyclitis: Secondary | ICD-10-CM | POA: Diagnosis not present

## 2018-12-07 DIAGNOSIS — M5030 Other cervical disc degeneration, unspecified cervical region: Secondary | ICD-10-CM | POA: Diagnosis not present

## 2018-12-07 DIAGNOSIS — C61 Malignant neoplasm of prostate: Secondary | ICD-10-CM | POA: Diagnosis not present

## 2018-12-07 DIAGNOSIS — M2550 Pain in unspecified joint: Secondary | ICD-10-CM | POA: Diagnosis not present

## 2018-12-07 DIAGNOSIS — M0579 Rheumatoid arthritis with rheumatoid factor of multiple sites without organ or systems involvement: Secondary | ICD-10-CM | POA: Diagnosis not present

## 2018-12-07 DIAGNOSIS — Z6826 Body mass index (BMI) 26.0-26.9, adult: Secondary | ICD-10-CM | POA: Diagnosis not present

## 2018-12-07 DIAGNOSIS — E663 Overweight: Secondary | ICD-10-CM | POA: Diagnosis not present

## 2018-12-07 DIAGNOSIS — M150 Primary generalized (osteo)arthritis: Secondary | ICD-10-CM | POA: Diagnosis not present

## 2018-12-12 ENCOUNTER — Ambulatory Visit (INDEPENDENT_AMBULATORY_CARE_PROVIDER_SITE_OTHER): Payer: Medicare Other | Admitting: Sports Medicine

## 2018-12-12 VITALS — BP 116/78 | Ht 76.0 in | Wt 208.0 lb

## 2018-12-12 DIAGNOSIS — M25522 Pain in left elbow: Secondary | ICD-10-CM | POA: Diagnosis not present

## 2018-12-12 MED ORDER — TRAMADOL HCL 50 MG PO TABS: 50.0000 mg | ORAL_TABLET | Freq: Three times a day (TID) | ORAL | 0 refills | Status: DC | PRN

## 2018-12-12 MED ORDER — HYDROCODONE-ACETAMINOPHEN 10-325 MG PO TABS: 1.0000 | ORAL_TABLET | Freq: Four times a day (QID) | ORAL | 0 refills | Status: DC | PRN

## 2018-12-13 ENCOUNTER — Encounter: Payer: Self-pay | Admitting: Sports Medicine

## 2018-12-13 NOTE — Progress Notes (Signed)
   Subjective:    Patient ID: Isaiah Price, male    DOB: 04/17/1951, 68 y.o.   MRN: [id number]  HPI chief complaint: Left elbow pain  Isaiah Price comes in today complaining of left elbow pain that began acutely on December 24.  He was picking up a heavy chair when he felt a "snap and grind" in the antecubital fossa.  He noticed some mild swelling at the time but no bruising.  He has not noticed any deformity.  Ever since then, he has pain in the antecubital fossa with lifting heavy objects.  Pain will radiate into the proximal forearm as well as up into the bicep.  He has a history of lateral epicondylitis but his current pain is different than what he has had with that in the past. His interim medical history is significant for prostate cancer which was treated with prostatectomy but unfortunately he is going to require further treatment.  He also continues under the excellent care of Dr. Lenna Gilford and Marella Chimes for his rheumatoid arthritis.  He is currently on low-dose prednisone and they are holding Biologics at this point in time as he undergoes treatment for his prostate cancer.   Review of Systems As above    Objective:   Physical Exam Well-developed, well-nourished.  No acute distress.  Awake alert and oriented x3.  Vital signs reviewed  Left elbow: Full range of motion.  No effusion.  No soft tissue swelling.  No ecchymosis.  No Popeye deformity.  Biceps tendon is palpable with a hook testing in the antecubital fossa but it is not as prominent as in the uninvolved right elbow.  He is tender to palpation along the distal biceps tendon and has significant pain with resisted supination and flexion of the elbow.  Good pulses distally.  Good grip strength.      Assessment & Plan:   Left elbow pain worrisome for high-grade partial biceps tendon tear Prostate cancer Rheumatoid arthritis  MRI of the left elbow specifically to rule out a high-grade biceps tendon tear.  Phone follow-up with those  results when available.  We will delineate treatment based on those findings.  In the meantime, I have refilled both his tramadol and his hydrocodone.  He will continue to follow-up with urology and rheumatology.

## 2018-12-14 DIAGNOSIS — C61 Malignant neoplasm of prostate: Secondary | ICD-10-CM | POA: Diagnosis not present

## 2018-12-18 DIAGNOSIS — N393 Stress incontinence (female) (male): Secondary | ICD-10-CM | POA: Diagnosis not present

## 2018-12-18 DIAGNOSIS — M62838 Other muscle spasm: Secondary | ICD-10-CM | POA: Diagnosis not present

## 2018-12-18 DIAGNOSIS — M6281 Muscle weakness (generalized): Secondary | ICD-10-CM | POA: Diagnosis not present

## 2018-12-21 DIAGNOSIS — C61 Malignant neoplasm of prostate: Secondary | ICD-10-CM | POA: Diagnosis not present

## 2018-12-21 DIAGNOSIS — R8279 Other abnormal findings on microbiological examination of urine: Secondary | ICD-10-CM | POA: Diagnosis not present

## 2018-12-23 ENCOUNTER — Ambulatory Visit
Admission: RE | Admit: 2018-12-23 | Discharge: 2018-12-23 | Disposition: A | Discharge status: Home / Self Care | Payer: Medicare Other | Source: Ambulatory Visit | Attending: Sports Medicine | Admitting: Sports Medicine

## 2018-12-23 DIAGNOSIS — M25522 Pain in left elbow: Secondary | ICD-10-CM

## 2018-12-23 DIAGNOSIS — M24822 Other specific joint derangements of left elbow, not elsewhere classified: Secondary | ICD-10-CM | POA: Diagnosis not present

## 2018-12-25 DIAGNOSIS — Z9079 Acquired absence of other genital organ(s): Secondary | ICD-10-CM | POA: Diagnosis not present

## 2018-12-25 DIAGNOSIS — C61 Malignant neoplasm of prostate: Secondary | ICD-10-CM | POA: Diagnosis not present

## 2018-12-27 ENCOUNTER — Telehealth: Payer: Self-pay | Admitting: Sports Medicine

## 2018-12-27 NOTE — Telephone Encounter (Signed)
  I spoke with Isaiah Price on the phone today after reviewing MRI findings of his left elbow.  MRI does confirm a partial tear of the distal biceps tendon near the radial tuberosity.  There is no significant tendon retraction.  His injury is 2 months old.  I do not believe the tear is significant enough that it require surgery at this time.  Isaiah Price has several other medical conditions, including prostate cancer, for which he is undergoing treatment.  He has also demonstrated a tendency to heal slowly and unpredictably from surgery so I would recommend against any sort of elective procedure for the time being.  Isaiah Price is in agreement with this.  I did caution him about avoiding heavy lifting with this arm as he is at risk of a complete rupture if he is not careful.  He understands.  If symptoms worsen or he suffers another acute injury to this elbow, then I would reconsider referral to orthopedics

## 2018-12-28 DIAGNOSIS — Z0000 Encounter for general adult medical examination without abnormal findings: Secondary | ICD-10-CM | POA: Diagnosis not present

## 2018-12-28 DIAGNOSIS — I10 Essential (primary) hypertension: Secondary | ICD-10-CM | POA: Diagnosis not present

## 2018-12-28 DIAGNOSIS — F5104 Psychophysiologic insomnia: Secondary | ICD-10-CM | POA: Diagnosis not present

## 2018-12-29 DIAGNOSIS — N393 Stress incontinence (female) (male): Secondary | ICD-10-CM | POA: Diagnosis not present

## 2019-01-01 DIAGNOSIS — M6281 Muscle weakness (generalized): Secondary | ICD-10-CM | POA: Diagnosis not present

## 2019-01-01 DIAGNOSIS — N393 Stress incontinence (female) (male): Secondary | ICD-10-CM | POA: Diagnosis not present

## 2019-01-01 DIAGNOSIS — M62838 Other muscle spasm: Secondary | ICD-10-CM | POA: Diagnosis not present

## 2019-01-17 ENCOUNTER — Other Ambulatory Visit: Payer: Self-pay | Admitting: Sports Medicine

## 2019-01-29 DIAGNOSIS — M2550 Pain in unspecified joint: Secondary | ICD-10-CM | POA: Diagnosis not present

## 2019-01-29 DIAGNOSIS — M7662 Achilles tendinitis, left leg: Secondary | ICD-10-CM | POA: Diagnosis not present

## 2019-01-29 DIAGNOSIS — M0579 Rheumatoid arthritis with rheumatoid factor of multiple sites without organ or systems involvement: Secondary | ICD-10-CM | POA: Diagnosis not present

## 2019-01-29 DIAGNOSIS — M79672 Pain in left foot: Secondary | ICD-10-CM | POA: Diagnosis not present

## 2019-01-29 DIAGNOSIS — M5030 Other cervical disc degeneration, unspecified cervical region: Secondary | ICD-10-CM | POA: Diagnosis not present

## 2019-01-29 DIAGNOSIS — C61 Malignant neoplasm of prostate: Secondary | ICD-10-CM | POA: Diagnosis not present

## 2019-01-29 DIAGNOSIS — M150 Primary generalized (osteo)arthritis: Secondary | ICD-10-CM | POA: Diagnosis not present

## 2019-01-29 DIAGNOSIS — M25522 Pain in left elbow: Secondary | ICD-10-CM | POA: Diagnosis not present

## 2019-01-29 DIAGNOSIS — H209 Unspecified iridocyclitis: Secondary | ICD-10-CM | POA: Diagnosis not present

## 2019-01-29 DIAGNOSIS — M542 Cervicalgia: Secondary | ICD-10-CM | POA: Diagnosis not present

## 2019-01-29 DIAGNOSIS — M7711 Lateral epicondylitis, right elbow: Secondary | ICD-10-CM | POA: Diagnosis not present

## 2019-02-12 DIAGNOSIS — M79672 Pain in left foot: Secondary | ICD-10-CM | POA: Diagnosis not present

## 2019-02-13 DIAGNOSIS — C61 Malignant neoplasm of prostate: Secondary | ICD-10-CM | POA: Diagnosis not present

## 2019-02-13 DIAGNOSIS — H209 Unspecified iridocyclitis: Secondary | ICD-10-CM | POA: Diagnosis not present

## 2019-02-13 DIAGNOSIS — M542 Cervicalgia: Secondary | ICD-10-CM | POA: Diagnosis not present

## 2019-02-13 DIAGNOSIS — M2550 Pain in unspecified joint: Secondary | ICD-10-CM | POA: Diagnosis not present

## 2019-02-13 DIAGNOSIS — M7711 Lateral epicondylitis, right elbow: Secondary | ICD-10-CM | POA: Diagnosis not present

## 2019-02-13 DIAGNOSIS — M150 Primary generalized (osteo)arthritis: Secondary | ICD-10-CM | POA: Diagnosis not present

## 2019-02-13 DIAGNOSIS — M7662 Achilles tendinitis, left leg: Secondary | ICD-10-CM | POA: Diagnosis not present

## 2019-02-13 DIAGNOSIS — M5030 Other cervical disc degeneration, unspecified cervical region: Secondary | ICD-10-CM | POA: Diagnosis not present

## 2019-02-13 DIAGNOSIS — M0579 Rheumatoid arthritis with rheumatoid factor of multiple sites without organ or systems involvement: Secondary | ICD-10-CM | POA: Diagnosis not present

## 2019-02-13 DIAGNOSIS — M25522 Pain in left elbow: Secondary | ICD-10-CM | POA: Diagnosis not present

## 2019-02-13 DIAGNOSIS — M79672 Pain in left foot: Secondary | ICD-10-CM | POA: Diagnosis not present

## 2019-02-21 DIAGNOSIS — C61 Malignant neoplasm of prostate: Secondary | ICD-10-CM | POA: Diagnosis not present

## 2019-02-28 DIAGNOSIS — N5201 Erectile dysfunction due to arterial insufficiency: Secondary | ICD-10-CM | POA: Diagnosis not present

## 2019-02-28 DIAGNOSIS — C61 Malignant neoplasm of prostate: Secondary | ICD-10-CM | POA: Diagnosis not present

## 2019-02-28 DIAGNOSIS — N393 Stress incontinence (female) (male): Secondary | ICD-10-CM | POA: Diagnosis not present

## 2019-03-07 DIAGNOSIS — M0579 Rheumatoid arthritis with rheumatoid factor of multiple sites without organ or systems involvement: Secondary | ICD-10-CM | POA: Diagnosis not present

## 2019-03-07 DIAGNOSIS — M2550 Pain in unspecified joint: Secondary | ICD-10-CM | POA: Diagnosis not present

## 2019-03-26 ENCOUNTER — Other Ambulatory Visit: Payer: Self-pay | Admitting: Sports Medicine

## 2019-04-16 DIAGNOSIS — M2550 Pain in unspecified joint: Secondary | ICD-10-CM | POA: Diagnosis not present

## 2019-04-16 DIAGNOSIS — M79672 Pain in left foot: Secondary | ICD-10-CM | POA: Diagnosis not present

## 2019-04-16 DIAGNOSIS — H209 Unspecified iridocyclitis: Secondary | ICD-10-CM | POA: Diagnosis not present

## 2019-04-16 DIAGNOSIS — M7662 Achilles tendinitis, left leg: Secondary | ICD-10-CM | POA: Diagnosis not present

## 2019-04-16 DIAGNOSIS — M0579 Rheumatoid arthritis with rheumatoid factor of multiple sites without organ or systems involvement: Secondary | ICD-10-CM | POA: Diagnosis not present

## 2019-04-16 DIAGNOSIS — M542 Cervicalgia: Secondary | ICD-10-CM | POA: Diagnosis not present

## 2019-04-16 DIAGNOSIS — Z6827 Body mass index (BMI) 27.0-27.9, adult: Secondary | ICD-10-CM | POA: Diagnosis not present

## 2019-04-16 DIAGNOSIS — E663 Overweight: Secondary | ICD-10-CM | POA: Diagnosis not present

## 2019-04-16 DIAGNOSIS — M5030 Other cervical disc degeneration, unspecified cervical region: Secondary | ICD-10-CM | POA: Diagnosis not present

## 2019-04-16 DIAGNOSIS — M25522 Pain in left elbow: Secondary | ICD-10-CM | POA: Diagnosis not present

## 2019-04-16 DIAGNOSIS — M150 Primary generalized (osteo)arthritis: Secondary | ICD-10-CM | POA: Diagnosis not present

## 2019-04-16 DIAGNOSIS — M7711 Lateral epicondylitis, right elbow: Secondary | ICD-10-CM | POA: Diagnosis not present

## 2019-05-31 ENCOUNTER — Encounter: Payer: Self-pay | Admitting: Sports Medicine

## 2019-05-31 ENCOUNTER — Ambulatory Visit (INDEPENDENT_AMBULATORY_CARE_PROVIDER_SITE_OTHER): Payer: Medicare Other | Admitting: Sports Medicine

## 2019-05-31 ENCOUNTER — Other Ambulatory Visit: Payer: Self-pay

## 2019-05-31 ENCOUNTER — Other Ambulatory Visit: Payer: Self-pay | Admitting: Sports Medicine

## 2019-05-31 VITALS — BP 122/84 | Ht 76.0 in | Wt 215.0 lb

## 2019-05-31 DIAGNOSIS — M542 Cervicalgia: Secondary | ICD-10-CM

## 2019-05-31 DIAGNOSIS — M4802 Spinal stenosis, cervical region: Secondary | ICD-10-CM

## 2019-05-31 MED ORDER — HYDROCODONE-ACETAMINOPHEN 10-325 MG PO TABS: 1.0000 | ORAL_TABLET | Freq: Four times a day (QID) | ORAL | 0 refills | Status: DC | PRN

## 2019-05-31 MED ORDER — TRAMADOL HCL 50 MG PO TABS: 50.0000 mg | ORAL_TABLET | Freq: Three times a day (TID) | ORAL | 0 refills | Status: DC | PRN

## 2019-05-31 NOTE — Progress Notes (Signed)
   Subjective:    Patient ID: Isaiah Price, male    DOB: Nov 01, 1950, 68 y.o.   MRN: [id number]  HPI chief complaint: Neck pain  Isaiah Price comes in today complaining of returning left-sided neck pain.  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  MRI of his cervical spine done in 2018 showed severe facet arthropathy at multiple levels as well as severe right-sided foraminal stenosis at C4-C5 and C6-C7.  He also has advanced atlantodental osteoarthritis.  He has done well with facet injections in the past.  Last set of injections were in May 2019.  His neck pain returned a couple of weeks ago.  It is identical in nature to what he has experienced previously.  He is on 5 mg of prednisone daily chronically which was given to him by his rheumatologist for rheumatoid arthritis.  He takes occasional hydrocodone and tramadol but he is out of both of these.  Pain does radiate into the left shoulder but he denies radiating pain down the left arm.  Interim medical history reviewed Medications reviewed Allergies reviewed   Review of Systems    As above Objective:   Physical Exam  Well-developed, well-nourished.  No acute distress.  Awake alert and oriented x3.  Vital signs reviewed  Cervical spine: Patient has limited cervical rotation by about 50%.  Full flexion.  Very limited extension.  Limited side bending.  He is tender to palpation along the left side of the cervical spine along the area of the facets.  No spasm.  Neurological exam: Strength is 5/5 in both upper extremities.  No noticeable atrophy.  Reflexes are brisk and equal at the biceps, triceps, and brachioradialis tendons.  Good pulses.      Assessment & Plan:   Return neck pain secondary to advanced cervical spine spondylopathy History of remote C5-C6 fusion Rheumatoid arthritis  Patient will be referred to interventional radiology for consideration of repeat facet injections.   His last MRI was in 2018 so I do not think we need to get updated imaging unless interventional radiology insists.  I have refilled both his hydrocodone and tramadol.  He continues to take these responsibly as needed for pain.  He will continue on his 5 mg of prednisone daily.  He is followed closely by his rheumatologist and is currently not on any Biologics as he waits to see what urology suggests for treatment for his prostate cancer( he has an appointment with urology next week to discuss this).  I think it is important for him to return to biologics when possible as I have no doubt that some of his cervical spine pain is secondary to his rheumatological disease, especially given the findings at the atlantodental joint on his 2018 MRI.  Follow-up as needed.

## 2019-06-01 DIAGNOSIS — C61 Malignant neoplasm of prostate: Secondary | ICD-10-CM | POA: Diagnosis not present

## 2019-06-08 DIAGNOSIS — N5201 Erectile dysfunction due to arterial insufficiency: Secondary | ICD-10-CM | POA: Diagnosis not present

## 2019-06-08 DIAGNOSIS — N393 Stress incontinence (female) (male): Secondary | ICD-10-CM | POA: Diagnosis not present

## 2019-06-08 DIAGNOSIS — C61 Malignant neoplasm of prostate: Secondary | ICD-10-CM | POA: Diagnosis not present

## 2019-06-13 ENCOUNTER — Other Ambulatory Visit: Payer: Self-pay

## 2019-06-13 ENCOUNTER — Ambulatory Visit
Admission: RE | Admit: 2019-06-13 | Discharge: 2019-06-13 | Disposition: A | Discharge status: Home / Self Care | Payer: Medicare Other | Source: Ambulatory Visit | Attending: Sports Medicine | Admitting: Sports Medicine

## 2019-06-13 DIAGNOSIS — M542 Cervicalgia: Secondary | ICD-10-CM | POA: Diagnosis not present

## 2019-06-13 MED ORDER — IOPAMIDOL (ISOVUE-M 300) INJECTION 61%
1.0000 mL | Freq: Once | INTRAMUSCULAR | Status: AC
  Administered 2019-06-13: 1 mL via EPIDURAL

## 2019-06-13 MED ORDER — TRIAMCINOLONE ACETONIDE 40 MG/ML IJ SUSP (RADIOLOGY)
60.0000 mg | Freq: Once | INTRAMUSCULAR | Status: AC
  Administered 2019-06-13: 60 mg via EPIDURAL

## 2019-06-13 NOTE — Discharge Instructions (Signed)

## 2019-06-27 DIAGNOSIS — M0579 Rheumatoid arthritis with rheumatoid factor of multiple sites without organ or systems involvement: Secondary | ICD-10-CM | POA: Diagnosis not present

## 2019-06-29 IMAGING — MR MR ELBOW*L* W/O CM
5 of 7 series · 24 of 40 positions shown · non-contrast
Comparison: None.

CLINICAL DATA: Left elbow pain for the past 2 months since feeling
a pop while caring a chair. Concern for biceps tendon injury.

EXAM:
MRI OF THE LEFT ELBOW WITHOUT CONTRAST
TECHNIQUE: Multiplanar, multisequence MR imaging of the elbow was performed. No
intravenous contrast was administered.

[Series 5: T1 · axial · 3.0mm · 0.23mm/px · 1 of 28 slices shown]
[im 1/28]
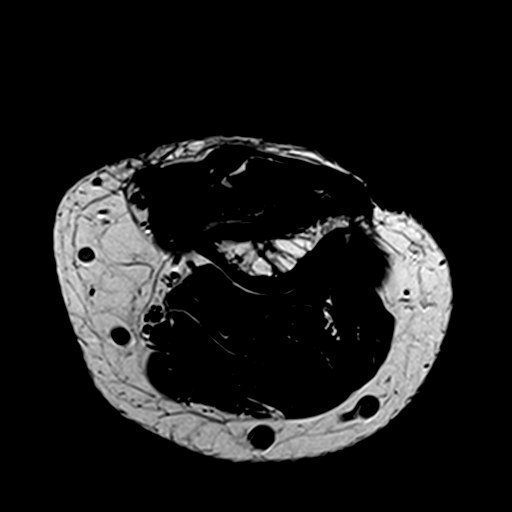

[Series 6: T2 fat-sat · axial · 3.0mm · 0.23mm/px · z∈[-55,+49]mm · 7 of 28 slices shown (1 of 2)]
[im 1/28]
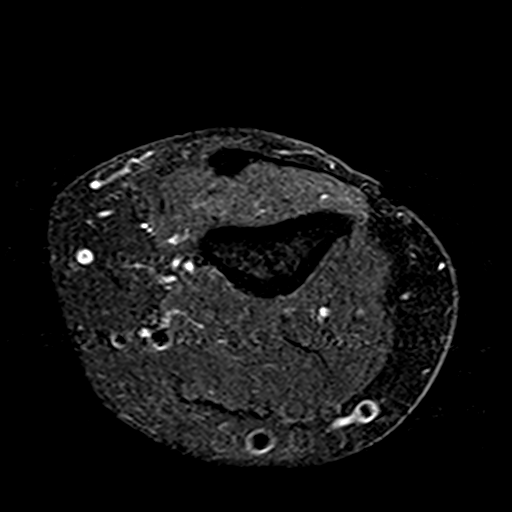
[im 5/28]
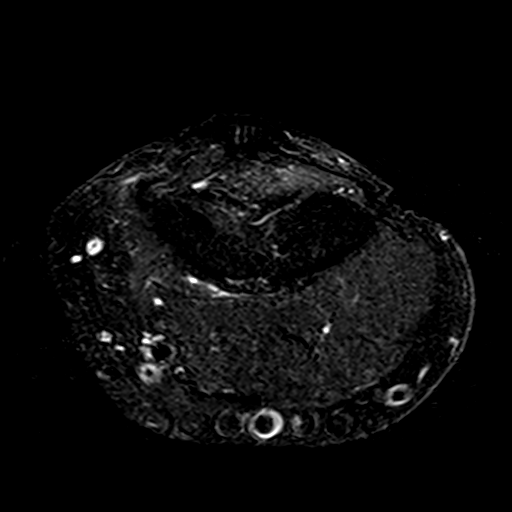
[im 10/28]
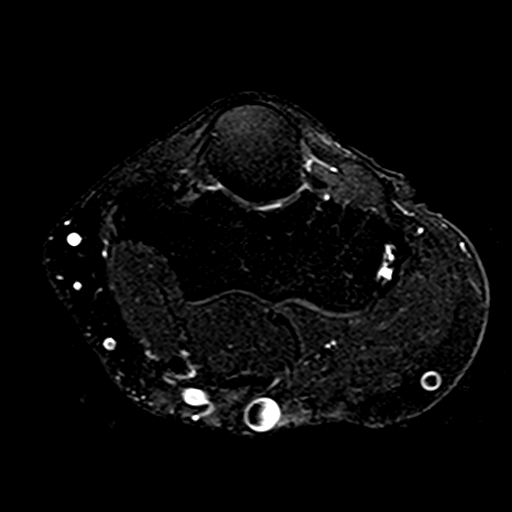
[im 14/28]
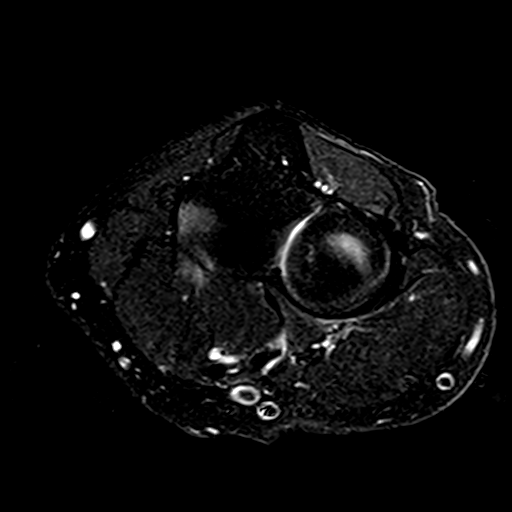
[im 19/28]
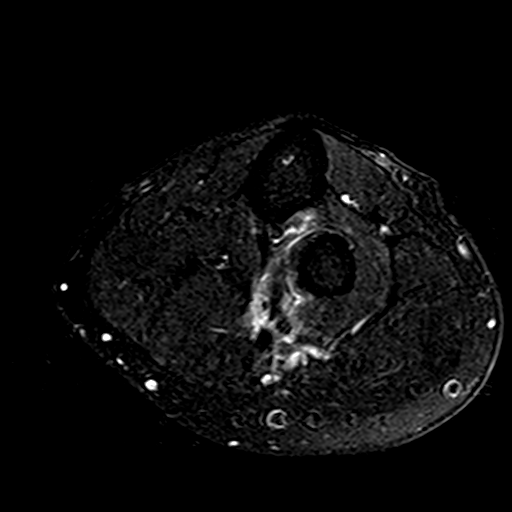
[im 23/28]
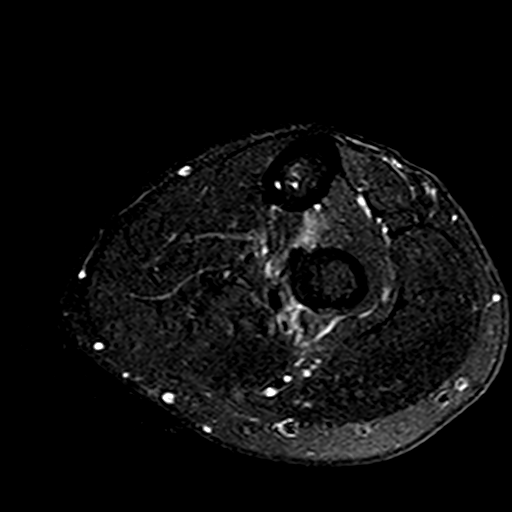
[im 28/28]
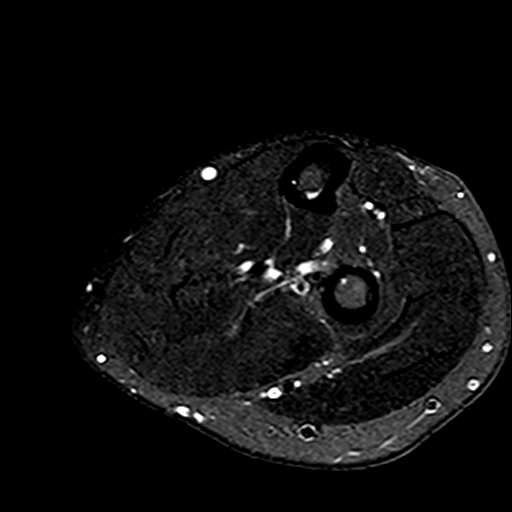

[Series 9: PD fat-sat · sagittal · 3.0mm · 0.55mm/px · 6 of 26 slices shown (1 of 2)]
[im 1/26]
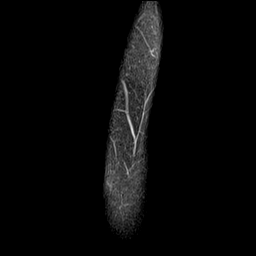
[im 6/26]
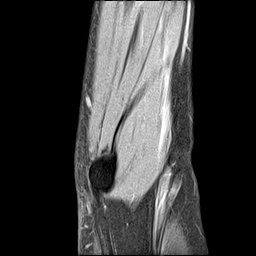
[im 11/26]
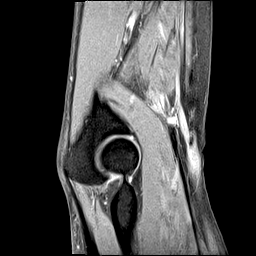
[im 16/26]
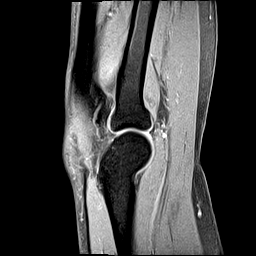
[im 21/26]
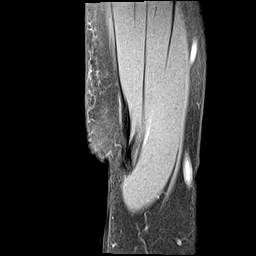
[im 26/26]
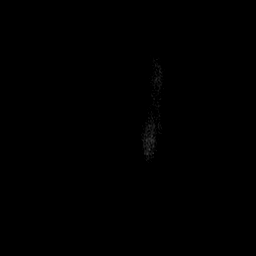

[Series 10: T2 fat-sat · coronal · 3.0mm · 0.27mm/px · 5 of 20 slices shown (2 of 2)]
[im 1/20]
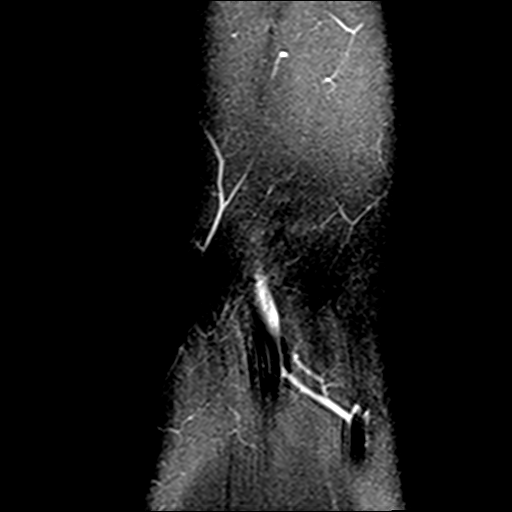
[im 5/20]
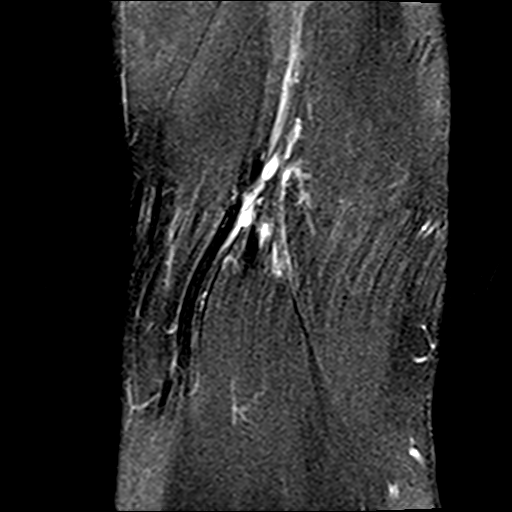
[im 10/20]
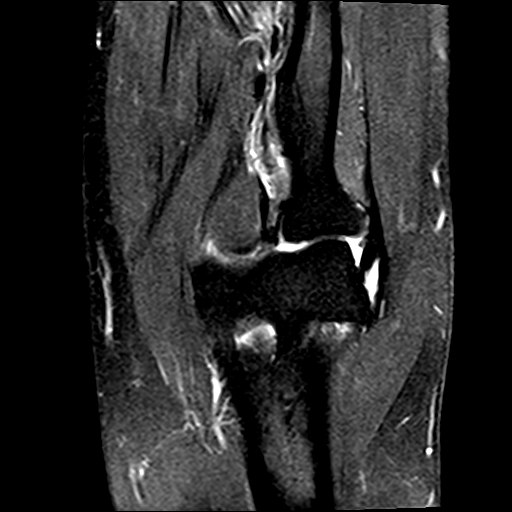
[im 15/20]
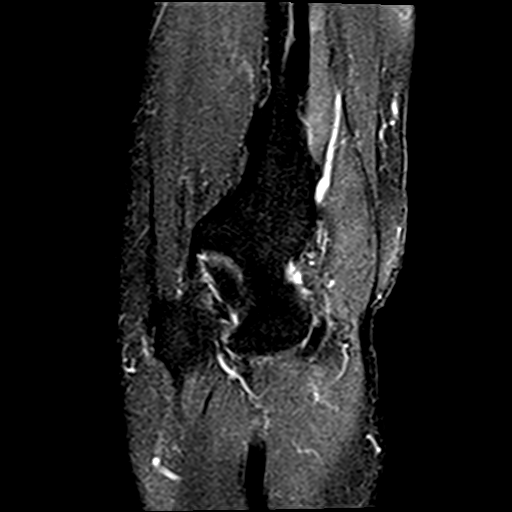
[im 20/20]
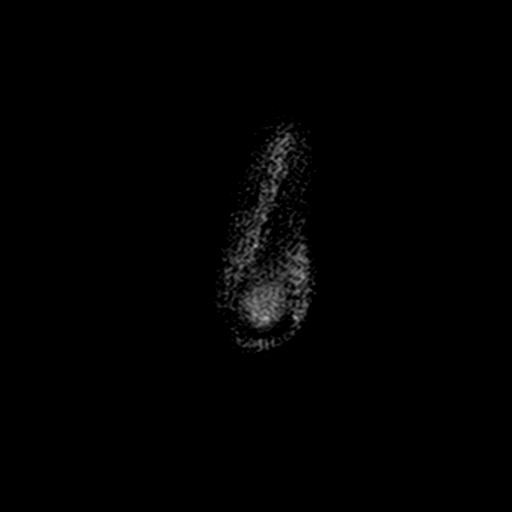

[Series 14: PD fat-sat · sagittal · 3.0mm · 0.27mm/px · 5 of 23 slices shown (2 of 2)]
[im 1/23]
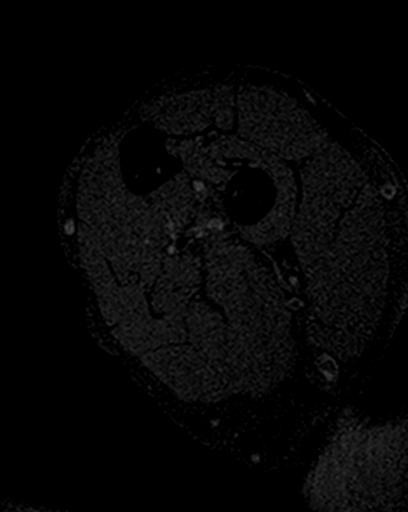
[im 6/23]
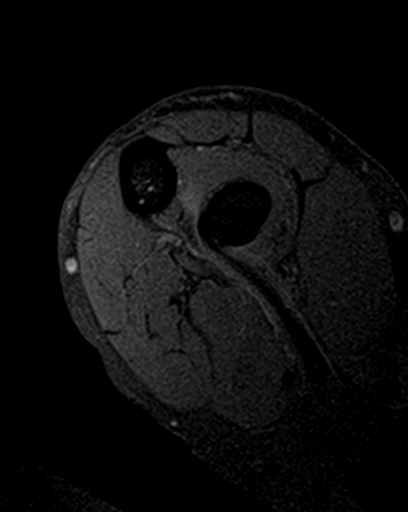
[im 12/23]
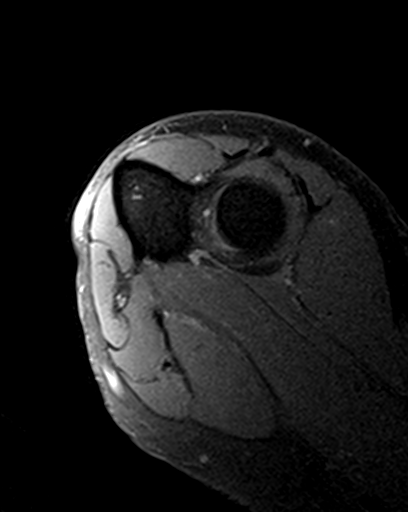
[im 17/23]
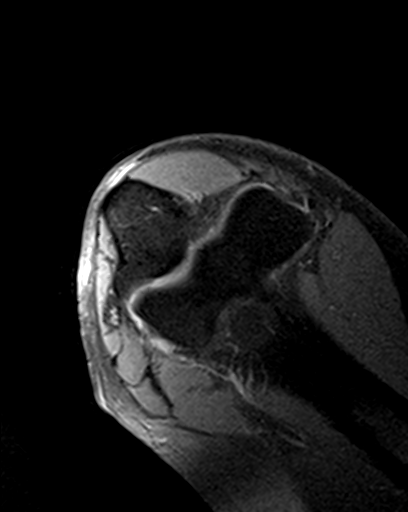
[im 23/23]
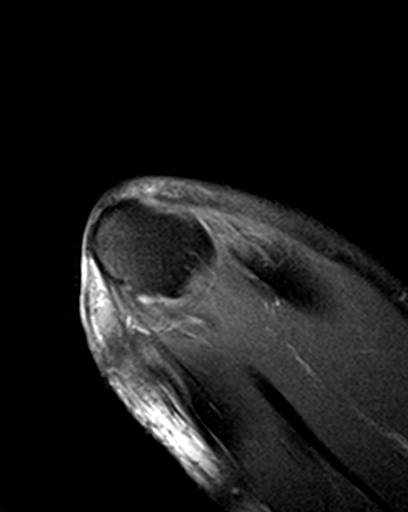

[24 of 40 positions shown; findings below may reference images not displayed]

FINDINGS: TENDONS

Common forearm flexor origin: Intact with normal signal.

Common forearm extensor origin: Intrasubstance tear.

Biceps: Partially torn distally near the radial tuberosity. No
significant tendon retraction.

Triceps: Intact with normal signal.

LIGAMENTS

Medial stabilizers: Intact.

Lateral stabilizers: The lateral ulnar and radial collateral
ligaments appear intact.

Cartilage: Preserved.  No focal chondral defect demonstrated.

Joint: No joint effusion or loose body observed.

Cubital tunnel: Unremarkable.  The ulnar nerve appears normal.

Bones: No acute or significant extra-articular osseous findings.

Other: None.
IMPRESSION: 1. Partial tear of the distal biceps tendon near the radial
tuberosity. No significant tendon retraction.
2. Intrasubstance tear of the common forearm extensor tendon origin.

## 2019-07-04 DIAGNOSIS — F5104 Psychophysiologic insomnia: Secondary | ICD-10-CM | POA: Diagnosis not present

## 2019-07-04 DIAGNOSIS — K219 Gastro-esophageal reflux disease without esophagitis: Secondary | ICD-10-CM | POA: Diagnosis not present

## 2019-07-04 DIAGNOSIS — Z23 Encounter for immunization: Secondary | ICD-10-CM | POA: Diagnosis not present

## 2019-07-04 DIAGNOSIS — I10 Essential (primary) hypertension: Secondary | ICD-10-CM | POA: Diagnosis not present

## 2019-07-11 DIAGNOSIS — M0579 Rheumatoid arthritis with rheumatoid factor of multiple sites without organ or systems involvement: Secondary | ICD-10-CM | POA: Diagnosis not present

## 2019-07-11 DIAGNOSIS — Z79899 Other long term (current) drug therapy: Secondary | ICD-10-CM | POA: Diagnosis not present

## 2019-07-17 DIAGNOSIS — M79672 Pain in left foot: Secondary | ICD-10-CM | POA: Diagnosis not present

## 2019-07-17 DIAGNOSIS — M7662 Achilles tendinitis, left leg: Secondary | ICD-10-CM | POA: Diagnosis not present

## 2019-07-17 DIAGNOSIS — Z6827 Body mass index (BMI) 27.0-27.9, adult: Secondary | ICD-10-CM | POA: Diagnosis not present

## 2019-07-17 DIAGNOSIS — M25522 Pain in left elbow: Secondary | ICD-10-CM | POA: Diagnosis not present

## 2019-07-17 DIAGNOSIS — H209 Unspecified iridocyclitis: Secondary | ICD-10-CM | POA: Diagnosis not present

## 2019-07-17 DIAGNOSIS — M0579 Rheumatoid arthritis with rheumatoid factor of multiple sites without organ or systems involvement: Secondary | ICD-10-CM | POA: Diagnosis not present

## 2019-07-17 DIAGNOSIS — M5030 Other cervical disc degeneration, unspecified cervical region: Secondary | ICD-10-CM | POA: Diagnosis not present

## 2019-07-17 DIAGNOSIS — M7711 Lateral epicondylitis, right elbow: Secondary | ICD-10-CM | POA: Diagnosis not present

## 2019-07-17 DIAGNOSIS — M2550 Pain in unspecified joint: Secondary | ICD-10-CM | POA: Diagnosis not present

## 2019-07-17 DIAGNOSIS — M542 Cervicalgia: Secondary | ICD-10-CM | POA: Diagnosis not present

## 2019-07-17 DIAGNOSIS — E663 Overweight: Secondary | ICD-10-CM | POA: Diagnosis not present

## 2019-07-17 DIAGNOSIS — M150 Primary generalized (osteo)arthritis: Secondary | ICD-10-CM | POA: Diagnosis not present

## 2019-07-23 ENCOUNTER — Other Ambulatory Visit: Payer: Self-pay | Admitting: Sports Medicine

## 2019-07-23 ENCOUNTER — Other Ambulatory Visit: Payer: Self-pay

## 2019-07-23 DIAGNOSIS — M542 Cervicalgia: Secondary | ICD-10-CM

## 2019-07-23 DIAGNOSIS — M4802 Spinal stenosis, cervical region: Secondary | ICD-10-CM

## 2019-07-23 MED ORDER — HYDROCODONE-ACETAMINOPHEN 10-325 MG PO TABS: 1.0000 | ORAL_TABLET | Freq: Four times a day (QID) | ORAL | 0 refills | Status: DC | PRN

## 2019-07-27 ENCOUNTER — Other Ambulatory Visit: Payer: Self-pay | Admitting: Sports Medicine

## 2019-07-27 ENCOUNTER — Other Ambulatory Visit: Payer: Self-pay

## 2019-07-27 ENCOUNTER — Ambulatory Visit
Admission: RE | Admit: 2019-07-27 | Discharge: 2019-07-27 | Disposition: A | Discharge status: Home / Self Care | Payer: Medicare Other | Source: Ambulatory Visit | Attending: Sports Medicine | Admitting: Sports Medicine

## 2019-07-27 DIAGNOSIS — M4802 Spinal stenosis, cervical region: Secondary | ICD-10-CM

## 2019-07-27 DIAGNOSIS — M4692 Unspecified inflammatory spondylopathy, cervical region: Secondary | ICD-10-CM | POA: Diagnosis not present

## 2019-07-27 DIAGNOSIS — M542 Cervicalgia: Secondary | ICD-10-CM | POA: Diagnosis not present

## 2019-07-27 MED ORDER — DEXAMETHASONE SODIUM PHOSPHATE 4 MG/ML IJ SOLN
2.0000 mg | Freq: Once | INTRAMUSCULAR | Status: AC
  Administered 2019-07-27: 13:00:00 5 mg via INTRA_ARTICULAR

## 2019-07-27 MED ORDER — IOPAMIDOL (ISOVUE-M 300) INJECTION 61%
1.0000 mL | Freq: Once | INTRAMUSCULAR | Status: AC | PRN
  Administered 2019-07-27: 1 mL via INTRA_ARTICULAR

## 2019-07-27 NOTE — Discharge Instructions (Signed)

## 2019-08-08 DIAGNOSIS — M0579 Rheumatoid arthritis with rheumatoid factor of multiple sites without organ or systems involvement: Secondary | ICD-10-CM | POA: Diagnosis not present

## 2019-08-08 DIAGNOSIS — M2550 Pain in unspecified joint: Secondary | ICD-10-CM | POA: Diagnosis not present

## 2019-08-14 DIAGNOSIS — H16223 Keratoconjunctivitis sicca, not specified as Sjogren's, bilateral: Secondary | ICD-10-CM | POA: Diagnosis not present

## 2019-08-14 DIAGNOSIS — Z961 Presence of intraocular lens: Secondary | ICD-10-CM | POA: Diagnosis not present

## 2019-08-14 DIAGNOSIS — H40053 Ocular hypertension, bilateral: Secondary | ICD-10-CM | POA: Diagnosis not present

## 2019-08-14 DIAGNOSIS — H0102A Squamous blepharitis right eye, upper and lower eyelids: Secondary | ICD-10-CM | POA: Diagnosis not present

## 2019-08-14 DIAGNOSIS — H10413 Chronic giant papillary conjunctivitis, bilateral: Secondary | ICD-10-CM | POA: Diagnosis not present

## 2019-08-14 DIAGNOSIS — H0102B Squamous blepharitis left eye, upper and lower eyelids: Secondary | ICD-10-CM | POA: Diagnosis not present

## 2019-08-28 DIAGNOSIS — R21 Rash and other nonspecific skin eruption: Secondary | ICD-10-CM | POA: Diagnosis not present

## 2019-08-29 DIAGNOSIS — C61 Malignant neoplasm of prostate: Secondary | ICD-10-CM | POA: Diagnosis not present

## 2019-09-05 DIAGNOSIS — N393 Stress incontinence (female) (male): Secondary | ICD-10-CM | POA: Diagnosis not present

## 2019-09-05 DIAGNOSIS — C61 Malignant neoplasm of prostate: Secondary | ICD-10-CM | POA: Diagnosis not present

## 2019-09-06 ENCOUNTER — Encounter: Payer: Self-pay | Admitting: *Deleted

## 2019-09-10 NOTE — Progress Notes (Signed)
2 Radiation Oncology         [phone] ________________________________  Outpatient Re-Consultation - Conducted via [place] due to current COVID-19 concerns for limiting patient exposure  Name: Isaiah Price MRN: [id number]  Date: 09/11/2019  DOB: [birth year]  CC:[name], MD  Raynelle Bring, MD   REFERRING PHYSICIAN: Raynelle Bring, MD  DIAGNOSIS: 68 y.o. gentleman with detectable, rising PSA s/p RALP for Stage pT3a, Gleason's Score of 4+4 prostate cancer    ICD-10-CM   1. Malignant neoplasm of prostate ([place])  C61   2. Prostate cancer [facility])  C61     HISTORY OF PRESENT ILLNESS: Isaiah Price is a 68 y.o. male with a diagnosis of prostate cancer. He is a well-established urology patient, previously followed by Dr. Jeffie Pollock, for an elevated PSA and lower urinary symptoms of outflow obstruction since 2012. 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 He was noted to have a steadily rising PSA, up to 5.73 on 07/03/18 and therefore proceeded to repeat transrectal ultrasound with 12 biopsies of the prostate on 08/09/18. Out of 12 core biopsies, 2 were positive with a maximum Gleason score of 3+4, and this was seen in left mid lateral.   We initially met him in consultation on 09/18/2018 to discuss treatment options and at that time, he opted to proceed with surgery.  He underwent a RALP with BPLND on 11/16/2018 under the care and direction of Dr. Alinda Money.  Final surgical pathology revealed pT3aN0, Gleason 4+4 adenocarcinoma of the prostate with focal extraprostatic extension identified at the left anterolateral mid but margins negative.  There was no seminal vesicle involvement and all biopsied lymph nodes were negative (0/9).  His initial postoperative PSA performed on 02/21/2019 remained detectable at 0.077 and has continued to slowly rise-- at 0.117 on 06/05/2019, and most recently at 0.137 on 08/31/2019.  He has been kindly referred back to [place] today for discussion of potential  salvage radiation options.   PREVIOUS RADIATION THERAPY: No  PAST MEDICAL HISTORY:  Past Medical History:  Diagnosis Date  . Achilles tendinitis, left leg   . At risk for sleep apnea    STOP--BANG SCORE= 5  (routed to pt's pcp in epic 11-13-2018)  . Chronic insomnia 12/31/2015  . Chronic neck pain   . Congenital pectus excavatum    per pt had pulmonary test ,  60% restrictive by states asymptomatic  . DDD (degenerative disc disease), cervical   . Epicondylitis, lateral, right   . GERD (gastroesophageal reflux disease)   . History of exercise intolerance    09-02-2017  ETT,  negative for ischemia  . History of pericarditis 1985  . HTN (hypertension)   . Hypercholesteremia 12/31/2015  . Iritis   . IT band syndrome 05/06/2014  . Lower urinary tract symptoms (LUTS)   . Neuropathy    bilateral arms with tingling and numbness at times due to cervical neck  . OA (osteoarthritis)   . Polyarthralgia   . Prostate cancer [facility])    urologist-  dr Alinda Money--- Stage T1c, Gleason 3+4  . Rheumatoid arthritis [facility])    rheumatologist-- dr Amil Amen--- seropositive,multiple sites  . Wears glasses       PAST SURGICAL HISTORY: Past Surgical History:  Procedure Laterality Date  . ANTERIOR CERVICAL DECOMP/DISCECTOMY FUSION  1991   dr Sherwood Gambler   C5 -- 7  (per pt fusion failed)  . CARPAL TUNNEL RELEASE Right done at same time of [place] fusion  . CARPOMETACARPAL ([facility])  FUSION OF THUMB Bilateral 2015 and 2014  approx.  Marland Kitchen CATARACT EXTRACTION W/ INTRAOCULAR LENS  IMPLANT, BILATERAL  2013  . LYMPHADENECTOMY Bilateral 11/16/2018   Procedure: LYMPHADENECTOMY, PELVIC;  Surgeon: Raynelle Bring, MD;  Location: [facility];  Service: Urology;  Laterality: Bilateral;  . ROBOT ASSISTED LAPAROSCOPIC RADICAL PROSTATECTOMY N/A 11/16/2018   Procedure: XI ROBOTIC ASSISTED LAPAROSCOPIC RADICAL PROSTATECTOMY LEVEL 2;  Surgeon: Raynelle Bring, MD;  Location: [facility];  Service: Urology;  Laterality: N/A;  . SHOULDER SURGERY Left age  16  . TONSILLECTOMY  child    FAMILY HISTORY:  Family History  Problem Relation Age of Onset  . Heart disease Father        Aortic Valve Stenosis, CABG 76  . Hypertension Father   . Macular degeneration Mother   . Heart disease Mother        MVR  . Osteosarcoma Paternal Uncle   . Osteosarcoma Son     SOCIAL HISTORY:  Social History   Socioeconomic History  . Marital status: Married    Spouse name: Manuela Schwartz  . Number of children: Not on file  . Years of education: Not on file  . Highest education level: Not on file  Occupational History  . Not on file  Social Needs  . Financial resource strain: Not on file  . Food insecurity    Worry: Not on file    Inability: Not on file  . Transportation needs    Medical: Not on file    Non-medical: Not on file  Tobacco Use  . Smoking status: Former Smoker    Packs/day: 1.00    Years: 25.00    Pack years: 25.00    Types: Cigarettes    Quit date: 12/13/2011    Years since quitting: 7.7  . Smokeless tobacco: Never Used  Substance and Sexual Activity  . Alcohol use: Yes    Comment: occasional  . Drug use: Never  . Sexual activity: Not on file    Comment: vasectomy  Lifestyle  . Physical activity    Days per week: Not on file    Minutes per session: Not on file  . Stress: Not on file  Relationships  . Social Herbalist on phone: Not on file    Gets together: Not on file    Attends religious service: Not on file    Active member of club or organization: Not on file    Attends meetings of clubs or organizations: Not on file    Relationship status: Not on file  . Intimate partner violence    Fear of current or ex partner: Not on file    Emotionally abused: Not on file    Physically abused: Not on file    Forced sexual activity: Not on file  Other Topics Concern  . Not on file  Social History Narrative   Retired from Adult nurse.    The patient is married and lives in [place]. 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  ALLERGIES: Nsaids, Brimonidine, Gabapentin, and Meloxicam  MEDICATIONS:  Current Outpatient Medications  Medication Sig Dispense Refill  . allopurinol (ZYLOPRIM) 100 MG tablet Take 200 mg by mouth daily.    Marland Kitchen atorvastatin (LIPITOR) 40 MG tablet Take 40 mg by mouth at bedtime.     Marland Kitchen diltiazem (CARDIZEM) 90 MG tablet Take 90 mg by mouth at bedtime.     . fluticasone (FLONASE) 50 MCG/ACT nasal spray Place 1  spray into the nose daily as needed for allergies.     Marland Kitchen HYDROcodone-acetaminophen (NORCO) 10-325 MG tablet Take 1 tablet by mouth every 6 (six) hours as needed for moderate pain or severe pain. 20 tablet 0  . inFLIXimab-dyyb (INFLECTRA) 100 MG SOLR Inject into the vein.    Marland Kitchen omeprazole (PRILOSEC) 40 MG capsule Take 40 mg by mouth daily.     . Polyvinyl Alcohol-Povidone (REFRESH OP) Place 1 drop into both eyes 2 (two) times daily as needed (dry eyes).     . predniSONE (DELTASONE) 5 MG tablet Take 5 mg by mouth daily with breakfast.    . traMADol (ULTRAM) 50 MG tablet TAKE (1) TABLET EVERY EIGHT HOURS AS NEEDED FOR PAIN. 60 tablet 0  . zolpidem (AMBIEN) 10 MG tablet Take 10 mg by mouth at bedtime.     . famotidine (PEPCID) 20 MG tablet Take 20 mg by mouth daily as needed for heartburn.     . sulfamethoxazole-trimethoprim (BACTRIM DS,SEPTRA DS) 800-160 MG tablet Take 1 tablet by mouth 2 (two) times daily. Start the day prior to foley removal appointment (Patient not taking: Reported on 05/31/2019) 6 tablet 0   No current facility-administered medications for this encounter.    Facility-Administered Medications Ordered in Other Encounters  Medication Dose Route Frequency Provider Last Rate Last Dose  . hydrocortisone sodium succinate (SOLU-CORTEF) injection 100 mg  100 mg Intravenous Once Raynelle Bring, MD        REVIEW OF SYSTEMS:  On review of systems, the patient reports that he is doing well overall. He denies any chest  pain, shortness of breath, cough, fevers, chills, night sweats, or unintended weight changes. He denies any bowel or bladder disturbances, and denies abdominal pain, nausea or vomiting.  He continues with mild occasional and URI and is still wearing 1 shield in his shorts per day for protection.  He continues working on pelvic floor strengthening exercises and feels that he is continuing to make gradual improvement regarding bladder control.  He has longstanding rheumatoid arthritis and has recently resumed his Inflectra infusions (biosimilar) under the care and direction of his rheumatologist, Dr. Amil Amen.  He denies any new musculoskeletal or joint aches or pains, new skin lesions or concerns. A complete review of systems is obtained and is otherwise negative.   PHYSICAL EXAM:  Wt Readings from Last 3 Encounters:  05/31/19 215 lb (97.5 kg)  12/12/18 208 lb (94.3 kg)  11/16/18 217 lb 13 oz (98.8 kg)   Temp Readings from Last 3 Encounters:  11/17/18 98 F (36.7 C) (Oral)  11/13/18 98.4 F (36.9 C) (Oral)  09/18/18 97.6 F (36.4 C) (Oral)   BP Readings from Last 3 Encounters:  07/27/19 (!) 170/98  06/13/19 (!) 140/92  05/31/19 122/84   Pulse Readings from Last 3 Encounters:  07/27/19 81  06/13/19 85  11/17/18 76    In general this is a well appearing Caucasian gentleman in no acute distress. He's alert and oriented x4 and appropriate throughout the examination. Cardiopulmonary assessment is negative for acute distress and he exhibits normal effort.   KPS = 90  100 - Normal; no complaints; no evidence of disease. 90   - Able to carry on normal activity; minor signs or symptoms of disease. 80   - Normal activity with effort; some signs or symptoms of disease. 36   - Cares for self; unable to carry on normal activity or to do active work. 60   - Requires occasional assistance, but  is able to care for most of his personal needs. 50   - Requires considerable assistance and frequent  medical care. 104   - Disabled; requires special care and assistance. 46   - Severely disabled; hospital admission is indicated although death not imminent. 50   - Very sick; hospital admission necessary; active supportive treatment necessary. 10   - Moribund; fatal processes progressing rapidly. 0     - Dead  Karnofsky DA, Abelmann [place], Craver LS and Burchenal [facility] [phone]) The use of the nitrogen mustards in the palliative treatment of carcinoma: with particular reference to bronchogenic carcinoma Cancer 1 [phone]  LABORATORY DATA:  Lab Results  Component Value Date   WBC 7.7 11/13/2018   HGB 12.8 (L) 11/17/2018   HCT 39.1 11/17/2018   MCV 88.9 11/13/2018   PLT 245 11/13/2018   Lab Results  Component Value Date   NA 138 11/13/2018   K 4.0 11/13/2018   CL 105 11/13/2018   CO2 22 11/13/2018   Lab Results  Component Value Date   ALT 29 12/23/2014   AST 16 12/23/2014   ALKPHOS 76 12/23/2014   BILITOT 0.9 12/23/2014     RADIOGRAPHY: No results found.    IMPRESSION/PLAN: This visit was conducted via MyChart to spare the patient unnecessary potential exposure in the healthcare setting during the current COVID-19 pandemic. 1. 68 y.o. gentleman with detectable, rising PSA s/p RALP for Stage pT3a, Gleason Score of 4+4 prostate cancer.  Today, we reviewed the findings and workup thus far.  We discussed the natural history of prostate cancer.  We reviewed the the implications of positive margins, extracapsular extension, and seminal vesicle involvement on the risk of prostate cancer recurrence in light of a detectable, rising PSA. We reviewed some of the evidence suggesting an advantage for patients who undergo adjuvant radiotherapy in the setting in terms of disease control and overall survival. We also discussed some of the dilemmas related to the available evidence.  We discussed the SWOG trial which did show an improvement in disease-free survival as well as overall survival using adjuvant  radiotherapy. However, we discussed the fact that the study did not carefully control the usage of adjuvant radiotherapy in the observation arm. There is increasing evidence that careful surveillance with ultrasensitive PSA may provide an opportunity for early salvage in patients who undergo observation, which can lead to excellent results in terms of disease control and survival. We discussed radiation treatment directed to the prostatic fossa with regard to the logistics and delivery of external beam radiation treatment.  We also discussed the potential role of short-term ADT in the management of persistent/recurrent prostate cancer.  We do not feel strongly that this would be necessary in his case but discussed that if he wants to be as aggressive as possible with further treatment, this is certainly a consideration and can be further discussed with Dr. Alinda Money.  He was encouraged to ask questions that were answered to his stated satisfaction.  At the conclusion of our conversation, the patient would like to proceed with continued PSA surveillance for now and consider proceeding with salvage radiotherapy, +/- ST-ADT, if and when the PSA reaches 0.2-0.3. He is currently scheduled for a repeat PSA on 12/03/2019. We will share our findings with Dr. Alinda Money and will look forward to following along in his progress.    Given current concerns for patient exposure during the COVID-19 pandemic, this encounter was conducted via video-enabled WebEx visit. The patient has given verbal consent  for this type of encounter. The time spent during this encounter was 40 minutes. The attendants for this meeting include Tyler Pita MD, Ashlyn Bruning PA-C, [place], patient EGE MUCKEY and his wife. During the encounter, Tyler Pita MD, Ashlyn Bruning PA-C, and scribe, Wilburn Mylar were located at [place].  Patient BARTON WANT and his wife were  located at home.     Nicholos Johns, PA-C    Tyler Pita, MD  [facility] Direct Dial: [phone]  Fax: [phone] [email or web address]  Skype  [email or web address]   This document serves as a record of services personally performed by Tyler Pita, MD and Freeman Caldron, PA-C. It was created on their behalf by Wilburn Mylar, a trained medical scribe. The creation of this record is based on the scribe's personal observations and the provider's statements to them. This document has been checked and approved by the attending provider.

## 2019-09-11 ENCOUNTER — Encounter: Payer: Self-pay | Admitting: Urology

## 2019-09-11 ENCOUNTER — Ambulatory Visit
Admission: RE | Admit: 2019-09-11 | Discharge: 2019-09-11 | Disposition: A | Discharge status: Home / Self Care | Payer: Medicare Other | Source: Ambulatory Visit | Attending: Radiation Oncology | Admitting: Radiation Oncology

## 2019-09-11 ENCOUNTER — Other Ambulatory Visit: Payer: Self-pay

## 2019-09-11 DIAGNOSIS — C61 Malignant neoplasm of prostate: Secondary | ICD-10-CM | POA: Diagnosis not present

## 2019-09-11 DIAGNOSIS — R9721 Rising PSA following treatment for malignant neoplasm of prostate: Secondary | ICD-10-CM | POA: Diagnosis not present

## 2019-09-11 DIAGNOSIS — Z9079 Acquired absence of other genital organ(s): Secondary | ICD-10-CM | POA: Diagnosis not present

## 2019-09-18 DIAGNOSIS — R21 Rash and other nonspecific skin eruption: Secondary | ICD-10-CM | POA: Diagnosis not present

## 2019-09-25 ENCOUNTER — Other Ambulatory Visit: Payer: Self-pay

## 2019-09-26 ENCOUNTER — Other Ambulatory Visit: Payer: Self-pay | Admitting: Sports Medicine

## 2019-09-26 MED ORDER — HYDROCODONE-ACETAMINOPHEN 10-325 MG PO TABS: 1.0000 | ORAL_TABLET | Freq: Four times a day (QID) | ORAL | 0 refills | Status: DC | PRN

## 2019-09-26 MED ORDER — TRAMADOL HCL 50 MG PO TABS: 50.0000 mg | ORAL_TABLET | Freq: Three times a day (TID) | ORAL | 0 refills | Status: DC | PRN

## 2019-11-16 ENCOUNTER — Ambulatory Visit: Payer: Medicare Other

## 2019-11-21 ENCOUNTER — Other Ambulatory Visit: Payer: Self-pay

## 2019-11-22 ENCOUNTER — Other Ambulatory Visit: Payer: Self-pay | Admitting: Sports Medicine

## 2019-11-22 MED ORDER — HYDROCODONE-ACETAMINOPHEN 10-325 MG PO TABS: 1.0000 | ORAL_TABLET | Freq: Four times a day (QID) | ORAL | 0 refills | Status: DC | PRN

## 2019-11-22 MED ORDER — TRAMADOL HCL 50 MG PO TABS: 50.0000 mg | ORAL_TABLET | Freq: Three times a day (TID) | ORAL | 0 refills | Status: DC | PRN

## 2019-11-24 ENCOUNTER — Ambulatory Visit: Payer: Medicare Other | Attending: Internal Medicine

## 2019-11-24 DIAGNOSIS — Z23 Encounter for immunization: Secondary | ICD-10-CM

## 2019-11-24 NOTE — Progress Notes (Signed)
   Covid-19 Vaccination Clinic  Name:  Isaiah Price    MRN: [id number] DOB: 1951-07-03  11/24/2019  Mr. Grieshop was observed post Covid-19 immunization for 15 minutes without incidence. He was provided with Vaccine Information Sheet and instruction to access the V-Safe system.   Mr. Maheshwari was instructed to call 911 with any severe reactions post vaccine: Marland Kitchen Difficulty breathing  . Swelling of your face and throat  . A fast heartbeat  . A bad rash all over your body  . Dizziness and weakness    Immunizations Administered    Name Date Dose VIS Date Route   Pfizer COVID-19 Vaccine 11/24/2019  9:18 AM 0.3 mL 09/28/2019 Intramuscular   Manufacturer: [place]   Lot: [id number]   [place]: [id number]

## 2019-11-28 DIAGNOSIS — M0579 Rheumatoid arthritis with rheumatoid factor of multiple sites without organ or systems involvement: Secondary | ICD-10-CM | POA: Diagnosis not present

## 2019-12-03 DIAGNOSIS — C61 Malignant neoplasm of prostate: Secondary | ICD-10-CM | POA: Diagnosis not present

## 2019-12-07 ENCOUNTER — Ambulatory Visit: Payer: Medicare Other

## 2019-12-11 ENCOUNTER — Ambulatory Visit (INDEPENDENT_AMBULATORY_CARE_PROVIDER_SITE_OTHER): Payer: Medicare Other | Admitting: Sports Medicine

## 2019-12-11 ENCOUNTER — Other Ambulatory Visit: Payer: Self-pay

## 2019-12-11 ENCOUNTER — Ambulatory Visit
Admission: RE | Admit: 2019-12-11 | Discharge: 2019-12-11 | Disposition: A | Discharge status: Home / Self Care | Payer: Medicare Other | Source: Ambulatory Visit | Attending: Sports Medicine | Admitting: Sports Medicine

## 2019-12-11 VITALS — BP 124/82 | Ht 76.0 in | Wt 211.0 lb

## 2019-12-11 DIAGNOSIS — M542 Cervicalgia: Secondary | ICD-10-CM | POA: Diagnosis not present

## 2019-12-11 DIAGNOSIS — M4802 Spinal stenosis, cervical region: Secondary | ICD-10-CM

## 2019-12-11 DIAGNOSIS — M5412 Radiculopathy, cervical region: Secondary | ICD-10-CM

## 2019-12-11 MED ORDER — METHOCARBAMOL 500 MG PO TABS: 500.0000 mg | ORAL_TABLET | Freq: Every evening | ORAL | 1 refills | Status: DC | PRN

## 2019-12-11 NOTE — Progress Notes (Signed)
   Subjective:    Patient ID: Isaiah Price, male    DOB: 1951/03/02, 69 y.o.   MRN: [id number]  HPI chief complaint: Neck pain  Isaiah Price comes in today with reoccurring chronic neck pain.  He has a known history of cervical degenerative disc disease, facet arthropathy, and stenosis.  He has done well with facet and epidural steroid injections in the past.  However, the most recent set of injections a little less than 3 months ago were less effective.  Pain is diffuse throughout the neck.  He does remember a specific episode where the neck did lock up on him.  Pain is a little different in nature than what he is experienced previously.  He does endorse numbness and tingling in both arms.  He is also noticing worsening weakness in both arms to the point that he is dropping small items.  He also experiences spasm occasionally.  He has taken Robaxin in the past for spasms.  He also complains of diffuse arthralgias throughout his body which is likely a result of his rheumatoid arthritis.  Interim medical history reviewed Medications reviewed Allergies reviewed    Review of Systems As above    Objective:   Physical Exam  Well-developed, well-nourished.  No acute distress  Cervical spine: Limited cervical range of motion.  Neurological exam: Patient has 1/4 reflexes at the biceps tendon and brachioradialis on the right.  2/4 triceps reflex on the right.  Left arm demonstrates 2/4 reflexes at biceps, triceps, and brachioradialis.  No obvious atrophy.       Assessment & Plan:   Chronic neck pain secondary to cervical degenerative disc disease, facet arthropathy, and spinal stenosis Rheumatoid arthritis  We're going to start with getting some updated images.  We will order x-rays as well as an MRI of the cervical spine specifically to reevaluate the severity of his spinal stenosis.  Patient currently takes tramadol and hydrocodone responsibly for his chronic pain.  He also takes 5 mg of prednisone  daily.  I will add Robaxin at night for muscle spasms as needed.  In addition, I would like to order an EMG/nerve conduction study of both upper extremities.  His worsening weakness is concerning and I am not certain that it is all related to his spinal stenosis.  Phone follow-up with the results of the studies when available and we will delineate further treatment based on those findings.  I did discuss the possibility of referral back to Dr. Sherwood Gambler if warranted.

## 2019-12-11 NOTE — Patient Instructions (Signed)
You can go get the x-ray when you leave today, no appt is needed. Please call [facility] to schedule you MRI. Someone from [facility] will contact you to schedule the EMG Dr. Micheline Chapman will call you with the results.

## 2019-12-14 ENCOUNTER — Encounter: Payer: Self-pay | Admitting: Neurology

## 2019-12-14 ENCOUNTER — Other Ambulatory Visit: Payer: Self-pay

## 2019-12-14 DIAGNOSIS — M4802 Spinal stenosis, cervical region: Secondary | ICD-10-CM

## 2019-12-18 ENCOUNTER — Encounter: Payer: Self-pay | Admitting: Medical Oncology

## 2019-12-19 ENCOUNTER — Ambulatory Visit: Payer: Medicare Other | Attending: Internal Medicine

## 2019-12-19 DIAGNOSIS — Z23 Encounter for immunization: Secondary | ICD-10-CM

## 2019-12-19 NOTE — Progress Notes (Signed)
   Covid-19 Vaccination Clinic  Name:  Isaiah Price    MRN: [id number] DOB: [birth year]  12/19/2019  [name] was observed post Covid-19 immunization for 15 minutes without incident. He was provided with Vaccine Information Sheet and instruction to access the V-Safe system.   [name] was instructed to call 911 with any severe reactions post vaccine: Marland Kitchen Difficulty breathing  . Swelling of face and throat  . A fast heartbeat  . A bad rash all over body  . Dizziness and weakness   Immunizations Administered    Name Date Dose VIS Date Route   Pfizer COVID-19 Vaccine 12/19/2019  9:57 AM 0.3 mL 09/28/2019 Intramuscular   Manufacturer: [place]   Lot: [id number]   [place]: [id number]

## 2019-12-25 DIAGNOSIS — Z23 Encounter for immunization: Secondary | ICD-10-CM | POA: Diagnosis not present

## 2019-12-25 DIAGNOSIS — Z808 Family history of malignant neoplasm of other organs or systems: Secondary | ICD-10-CM | POA: Diagnosis not present

## 2019-12-25 DIAGNOSIS — L578 Other skin changes due to chronic exposure to nonionizing radiation: Secondary | ICD-10-CM | POA: Diagnosis not present

## 2019-12-25 DIAGNOSIS — L719 Rosacea, unspecified: Secondary | ICD-10-CM | POA: Diagnosis not present

## 2019-12-25 DIAGNOSIS — L570 Actinic keratosis: Secondary | ICD-10-CM | POA: Diagnosis not present

## 2019-12-25 DIAGNOSIS — L821 Other seborrheic keratosis: Secondary | ICD-10-CM | POA: Diagnosis not present

## 2019-12-25 DIAGNOSIS — D225 Melanocytic nevi of trunk: Secondary | ICD-10-CM | POA: Diagnosis not present

## 2019-12-27 ENCOUNTER — Other Ambulatory Visit: Payer: Self-pay

## 2019-12-27 ENCOUNTER — Ambulatory Visit
Admission: RE | Admit: 2019-12-27 | Discharge: 2019-12-27 | Disposition: A | Discharge status: Home / Self Care | Payer: Medicare Other | Source: Ambulatory Visit | Attending: Sports Medicine | Admitting: Sports Medicine

## 2019-12-27 DIAGNOSIS — M4802 Spinal stenosis, cervical region: Secondary | ICD-10-CM | POA: Diagnosis not present

## 2019-12-27 NOTE — Progress Notes (Signed)
Patient left voicemail stating he just received recent PSA results and needs to discuss.  I returned call and patient states his PSA is 0.15.  He had extraprostatic extension and wonders  if he should move forward with radiation or continue to wait. We discussed how his PSA  went up minimally and remains under 0.2. We discussed how he can wait and see what is PSA is in May or if he would like to move forward with radiation, he can do that. He is going to give it some thought and call me back.

## 2020-01-01 ENCOUNTER — Ambulatory Visit (INDEPENDENT_AMBULATORY_CARE_PROVIDER_SITE_OTHER): Payer: Medicare Other | Admitting: Neurology

## 2020-01-01 ENCOUNTER — Telehealth: Payer: Self-pay | Admitting: Sports Medicine

## 2020-01-01 ENCOUNTER — Other Ambulatory Visit: Payer: Self-pay

## 2020-01-01 DIAGNOSIS — G5621 Lesion of ulnar nerve, right upper limb: Secondary | ICD-10-CM

## 2020-01-01 DIAGNOSIS — M4802 Spinal stenosis, cervical region: Secondary | ICD-10-CM | POA: Diagnosis not present

## 2020-01-01 DIAGNOSIS — M5412 Radiculopathy, cervical region: Secondary | ICD-10-CM

## 2020-01-01 NOTE — Telephone Encounter (Signed)
  X-rays and MRI of the cervical spine are reviewed.  When compared to the MRI from 2018, there has been no significant change.  He still has severe right-sided foraminal stenosis at C4-C5 and C5-C6.  Also severe left foraminal stenosis at C4-C5.  Previous arthrodesis is intact.  Patient will be notified via telephone of these results.  At some point he may need to consider a follow-up appointment with Dr. Sherwood Gambler.  Follow-up with me as needed.

## 2020-01-01 NOTE — Procedures (Signed)
[facility]  [place], [place]  [place], [place] [address] Tel: [phone] Fax:  [phone] Test Date:  01/01/2020  Patient: Isaiah Price DOB: 1950/12/10 Physician: Narda Amber, DO  Sex: Male Height: 6\' 4"  Ref Phys: Lilia Argue, DO  ID#: [id number] Temp: 36.0C Technician:    Patient Complaints: This is a 69 year old man referred for evaluation of neck pain and arm weakness.  NCV & EMG Findings: Extensive electrodiagnostic testing of the right upper extremity and additional studies of the left shows:  1. Bilateral median, ulnar, and mixed palmar sensory responses are within normal limits. 2. Bilateral median and left ulnar motor responses are within normal limits.  Right ulnar motor response shows slowed conduction velocity across the elbow (A Elbow-B Elbow, 38 m/s).  3. Chronic motor axonal loss changes are seen affecting right pronator teres and triceps muscles, without accompanied active denervation.  These findings are not present in the left upper extremity.  Impression: 1. Chronic C7 radiculopathy affecting the right upper extremity, moderate. 2. Right ulnar neuropathy with slowing across the elbow, purely demyelinating, mild.   ___________________________ Narda Amber, DO    Nerve Conduction Studies Anti Sensory Summary Table   Stim Site NR Peak (ms) Norm Peak (ms) P-T Amp (V) Norm P-T Amp  Left Median Anti Sensory (2nd Digit)  36C  Wrist    3.2 <3.8 14.4 >10  Right Median Anti Sensory (2nd Digit)  36C  Wrist    3.1 <3.8 17.7 >10  Left Ulnar Anti Sensory (5th Digit)  36C  Wrist    2.8 <3.2 20.1 >5  Right Ulnar Anti Sensory (5th Digit)  36C  Wrist    2.8 <3.2 13.4 >5   Motor Summary Table   Stim Site NR Onset (ms) Norm Onset (ms) O-P Amp (mV) Norm O-P Amp Site1 Site2 Delta-0 (ms) Dist (cm) Vel (m/s) Norm Vel (m/s)  Left Median Motor (Abd Poll Brev)  36C  Wrist    3.4 <4.0 9.1 >5 Elbow Wrist 5.2 29.0 56 >50  Elbow    8.6  8.7           Right Median Motor (Abd Poll Brev)  36C  Wrist    3.0 <4.0 8.3 >5 Elbow Wrist 4.9 31.0 63 >50  Elbow    7.9  7.8         Left Ulnar Motor (Abd Dig Minimi)  36C  Wrist    1.9 <3.1 8.5 >7 B Elbow Wrist 4.6 26.0 57 >50  B Elbow    6.5  8.3  A Elbow B Elbow 1.9 10.0 53 >50  A Elbow    8.4  7.6         Right Ulnar Motor (Abd Dig Minimi)  36C  Wrist    1.9 <3.1 8.3 >7 B Elbow Wrist 4.2 24.0 57 >50  B Elbow    6.1  7.6  A Elbow B Elbow 2.6 10.0 38 >50  A Elbow    8.7  6.8          Comparison Summary Table   Stim Site NR Peak (ms) Norm Peak (ms) P-T Amp (V) Site1 Site2 Delta-P (ms) Norm Delta (ms)  Left Median/Ulnar Palm Comparison (Wrist - 8cm)  36C  Median Palm    1.7 <2.2 29.3 Median Palm Ulnar Palm 0.1   Ulnar Palm    1.6 <2.2 12.9      Right Median/Ulnar Palm Comparison (Wrist - 8cm)  36C  Median [employer]  1.7 <2.2 32.2 Median Palm Ulnar Palm 0.1   Ulnar Palm    1.6 <2.2 8.7       EMG   Side Muscle Ins Act Fibs Psw Fasc Number Recrt Dur Dur. Amp Amp. Poly Poly. Comment  Right 1stDorInt Nml Nml Nml Nml Nml Nml Nml Nml Nml Nml Nml Nml N/A  Right PronatorTeres Nml Nml Nml Nml Nml Nml Nml Nml Nml Nml Nml Nml N/A  Right Biceps Nml Nml Nml Nml 1- Rapid Some 1+ Some 1+ Nml Nml N/A  Right Triceps Nml Nml Nml Nml 1- Rapid Some 1+ Some 1+ Nml Nml N/A  Right Deltoid Nml Nml Nml Nml Nml Nml Nml Nml Nml Nml Nml Nml N/A  Right FlexCarpiUln Nml Nml Nml Nml Nml Nml Nml Nml Nml Nml Nml Nml N/A  Left 1stDorInt Nml Nml Nml Nml Nml Nml Nml Nml Nml Nml Nml Nml N/A  Left PronatorTeres Nml Nml Nml Nml Nml Nml Nml Nml Nml Nml Nml Nml N/A  Left Biceps Nml Nml Nml Nml Nml Nml Nml Nml Nml Nml Nml Nml N/A  Left Triceps Nml Nml Nml Nml Nml Nml Nml Nml Nml Nml Nml Nml N/A  Left Deltoid Nml Nml Nml Nml Nml Nml Nml Nml Nml Nml Nml Nml N/A      Waveforms:

## 2020-01-02 DIAGNOSIS — Z0000 Encounter for general adult medical examination without abnormal findings: Secondary | ICD-10-CM | POA: Diagnosis not present

## 2020-01-02 DIAGNOSIS — Z79891 Long term (current) use of opiate analgesic: Secondary | ICD-10-CM | POA: Diagnosis not present

## 2020-01-02 DIAGNOSIS — F5104 Psychophysiologic insomnia: Secondary | ICD-10-CM | POA: Diagnosis not present

## 2020-01-02 DIAGNOSIS — K219 Gastro-esophageal reflux disease without esophagitis: Secondary | ICD-10-CM | POA: Diagnosis not present

## 2020-01-02 DIAGNOSIS — M0579 Rheumatoid arthritis with rheumatoid factor of multiple sites without organ or systems involvement: Secondary | ICD-10-CM | POA: Diagnosis not present

## 2020-01-09 ENCOUNTER — Telehealth: Payer: Self-pay | Admitting: Sports Medicine

## 2020-01-09 NOTE — Telephone Encounter (Signed)
  I spoke with Isaiah Price on the phone today after reviewing EMG/nerve conduction studies done recently on both upper extremities.  He has chronic C7 radiculopathy in the right upper extremity which is moderate in severity.  Also some mild right ulnar neuropathy.  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  He has had multiple cortisone injections in his neck over the past several years.  I recommended that he follow-up with Dr. Sherwood Gambler just to make sure that future injections are okay.  I do not think Isaiah Price symptoms are serious enough now that he wants to consider surgery but it has been a while since he saw Dr. Sherwood Gambler so I think it is reasonable for him to see him again now that he has updated imaging and nerve conduction studies.  Follow-up with me as needed.

## 2020-01-23 ENCOUNTER — Other Ambulatory Visit: Payer: Self-pay

## 2020-01-23 DIAGNOSIS — M0579 Rheumatoid arthritis with rheumatoid factor of multiple sites without organ or systems involvement: Secondary | ICD-10-CM | POA: Diagnosis not present

## 2020-01-24 ENCOUNTER — Other Ambulatory Visit: Payer: Self-pay | Admitting: Sports Medicine

## 2020-01-24 DIAGNOSIS — M7662 Achilles tendinitis, left leg: Secondary | ICD-10-CM | POA: Diagnosis not present

## 2020-01-24 DIAGNOSIS — M0579 Rheumatoid arthritis with rheumatoid factor of multiple sites without organ or systems involvement: Secondary | ICD-10-CM | POA: Diagnosis not present

## 2020-01-24 DIAGNOSIS — M79672 Pain in left foot: Secondary | ICD-10-CM | POA: Diagnosis not present

## 2020-01-24 DIAGNOSIS — Z6827 Body mass index (BMI) 27.0-27.9, adult: Secondary | ICD-10-CM | POA: Diagnosis not present

## 2020-01-24 DIAGNOSIS — M542 Cervicalgia: Secondary | ICD-10-CM | POA: Diagnosis not present

## 2020-01-24 DIAGNOSIS — H209 Unspecified iridocyclitis: Secondary | ICD-10-CM | POA: Diagnosis not present

## 2020-01-24 DIAGNOSIS — M5030 Other cervical disc degeneration, unspecified cervical region: Secondary | ICD-10-CM | POA: Diagnosis not present

## 2020-01-24 DIAGNOSIS — M150 Primary generalized (osteo)arthritis: Secondary | ICD-10-CM | POA: Diagnosis not present

## 2020-01-24 DIAGNOSIS — M25522 Pain in left elbow: Secondary | ICD-10-CM | POA: Diagnosis not present

## 2020-01-24 DIAGNOSIS — M25475 Effusion, left foot: Secondary | ICD-10-CM | POA: Diagnosis not present

## 2020-01-24 DIAGNOSIS — M7711 Lateral epicondylitis, right elbow: Secondary | ICD-10-CM | POA: Diagnosis not present

## 2020-01-24 DIAGNOSIS — M2550 Pain in unspecified joint: Secondary | ICD-10-CM | POA: Diagnosis not present

## 2020-01-24 MED ORDER — TRAMADOL HCL 50 MG PO TABS: 50.0000 mg | ORAL_TABLET | Freq: Three times a day (TID) | ORAL | 0 refills | Status: DC | PRN

## 2020-01-24 MED ORDER — HYDROCODONE-ACETAMINOPHEN 10-325 MG PO TABS: 1.0000 | ORAL_TABLET | Freq: Four times a day (QID) | ORAL | 0 refills | Status: DC | PRN

## 2020-02-19 ENCOUNTER — Other Ambulatory Visit: Payer: Self-pay | Admitting: Sports Medicine

## 2020-02-29 DIAGNOSIS — C61 Malignant neoplasm of prostate: Secondary | ICD-10-CM | POA: Diagnosis not present

## 2020-03-07 DIAGNOSIS — N393 Stress incontinence (female) (male): Secondary | ICD-10-CM | POA: Diagnosis not present

## 2020-03-07 DIAGNOSIS — N5201 Erectile dysfunction due to arterial insufficiency: Secondary | ICD-10-CM | POA: Diagnosis not present

## 2020-03-07 DIAGNOSIS — C61 Malignant neoplasm of prostate: Secondary | ICD-10-CM | POA: Diagnosis not present

## 2020-03-11 ENCOUNTER — Other Ambulatory Visit: Payer: Self-pay

## 2020-03-13 ENCOUNTER — Other Ambulatory Visit: Payer: Self-pay | Admitting: Sports Medicine

## 2020-03-13 MED ORDER — HYDROCODONE-ACETAMINOPHEN 10-325 MG PO TABS: 1.0000 | ORAL_TABLET | Freq: Four times a day (QID) | ORAL | 0 refills | Status: DC | PRN

## 2020-03-13 MED ORDER — TRAMADOL HCL 50 MG PO TABS: 50.0000 mg | ORAL_TABLET | Freq: Three times a day (TID) | ORAL | 0 refills | Status: DC | PRN

## 2020-03-19 DIAGNOSIS — M0579 Rheumatoid arthritis with rheumatoid factor of multiple sites without organ or systems involvement: Secondary | ICD-10-CM | POA: Diagnosis not present

## 2020-03-31 DIAGNOSIS — R519 Headache, unspecified: Secondary | ICD-10-CM | POA: Diagnosis not present

## 2020-03-31 DIAGNOSIS — R251 Tremor, unspecified: Secondary | ICD-10-CM | POA: Diagnosis not present

## 2020-03-31 DIAGNOSIS — R110 Nausea: Secondary | ICD-10-CM | POA: Diagnosis not present

## 2020-03-31 DIAGNOSIS — Z20822 Contact with and (suspected) exposure to covid-19: Secondary | ICD-10-CM | POA: Diagnosis not present

## 2020-04-02 DIAGNOSIS — M5030 Other cervical disc degeneration, unspecified cervical region: Secondary | ICD-10-CM | POA: Diagnosis not present

## 2020-05-14 DIAGNOSIS — M109 Gout, unspecified: Secondary | ICD-10-CM | POA: Diagnosis not present

## 2020-05-14 DIAGNOSIS — M0579 Rheumatoid arthritis with rheumatoid factor of multiple sites without organ or systems involvement: Secondary | ICD-10-CM | POA: Diagnosis not present

## 2020-05-19 ENCOUNTER — Other Ambulatory Visit: Payer: Self-pay

## 2020-05-21 ENCOUNTER — Other Ambulatory Visit: Payer: Self-pay | Admitting: Sports Medicine

## 2020-05-21 MED ORDER — HYDROCODONE-ACETAMINOPHEN 10-325 MG PO TABS: 1.0000 | ORAL_TABLET | Freq: Four times a day (QID) | ORAL | 0 refills | Status: DC | PRN

## 2020-05-21 MED ORDER — TRAMADOL HCL 50 MG PO TABS: 50.0000 mg | ORAL_TABLET | Freq: Three times a day (TID) | ORAL | 0 refills | Status: DC | PRN

## 2020-06-16 ENCOUNTER — Other Ambulatory Visit: Payer: Self-pay | Admitting: *Deleted

## 2020-06-16 ENCOUNTER — Other Ambulatory Visit: Payer: Self-pay

## 2020-06-16 MED ORDER — METHOCARBAMOL 500 MG PO TABS: ORAL_TABLET | ORAL | 0 refills | Status: DC

## 2020-06-25 DIAGNOSIS — Z23 Encounter for immunization: Secondary | ICD-10-CM | POA: Diagnosis not present

## 2020-07-02 DIAGNOSIS — M0579 Rheumatoid arthritis with rheumatoid factor of multiple sites without organ or systems involvement: Secondary | ICD-10-CM | POA: Diagnosis not present

## 2020-07-02 DIAGNOSIS — K219 Gastro-esophageal reflux disease without esophagitis: Secondary | ICD-10-CM | POA: Diagnosis not present

## 2020-07-02 DIAGNOSIS — G8929 Other chronic pain: Secondary | ICD-10-CM | POA: Diagnosis not present

## 2020-07-02 DIAGNOSIS — F5104 Psychophysiologic insomnia: Secondary | ICD-10-CM | POA: Diagnosis not present

## 2020-07-02 DIAGNOSIS — I10 Essential (primary) hypertension: Secondary | ICD-10-CM | POA: Diagnosis not present

## 2020-07-09 DIAGNOSIS — M0579 Rheumatoid arthritis with rheumatoid factor of multiple sites without organ or systems involvement: Secondary | ICD-10-CM | POA: Diagnosis not present

## 2020-07-14 ENCOUNTER — Other Ambulatory Visit: Payer: Self-pay

## 2020-07-15 ENCOUNTER — Other Ambulatory Visit: Payer: Self-pay

## 2020-07-15 ENCOUNTER — Other Ambulatory Visit: Payer: Self-pay | Admitting: Sports Medicine

## 2020-07-15 DIAGNOSIS — C61 Malignant neoplasm of prostate: Secondary | ICD-10-CM | POA: Diagnosis not present

## 2020-07-15 DIAGNOSIS — H209 Unspecified iridocyclitis: Secondary | ICD-10-CM | POA: Diagnosis not present

## 2020-07-15 DIAGNOSIS — M4802 Spinal stenosis, cervical region: Secondary | ICD-10-CM

## 2020-07-15 DIAGNOSIS — M0579 Rheumatoid arthritis with rheumatoid factor of multiple sites without organ or systems involvement: Secondary | ICD-10-CM | POA: Diagnosis not present

## 2020-07-15 DIAGNOSIS — E663 Overweight: Secondary | ICD-10-CM | POA: Diagnosis not present

## 2020-07-15 DIAGNOSIS — M109 Gout, unspecified: Secondary | ICD-10-CM | POA: Diagnosis not present

## 2020-07-15 DIAGNOSIS — M150 Primary generalized (osteo)arthritis: Secondary | ICD-10-CM | POA: Diagnosis not present

## 2020-07-15 DIAGNOSIS — Z6825 Body mass index (BMI) 25.0-25.9, adult: Secondary | ICD-10-CM | POA: Diagnosis not present

## 2020-07-15 DIAGNOSIS — M7989 Other specified soft tissue disorders: Secondary | ICD-10-CM | POA: Diagnosis not present

## 2020-07-15 DIAGNOSIS — M7711 Lateral epicondylitis, right elbow: Secondary | ICD-10-CM | POA: Diagnosis not present

## 2020-07-15 DIAGNOSIS — M5030 Other cervical disc degeneration, unspecified cervical region: Secondary | ICD-10-CM | POA: Diagnosis not present

## 2020-07-15 NOTE — Telephone Encounter (Signed)
Go ahead refer him to [facility] for repeat injection (facet vs ESI per radiologist's discretion)

## 2020-07-17 ENCOUNTER — Other Ambulatory Visit: Payer: Self-pay | Admitting: Sports Medicine

## 2020-07-21 ENCOUNTER — Ambulatory Visit (INDEPENDENT_AMBULATORY_CARE_PROVIDER_SITE_OTHER): Payer: Medicare Other | Admitting: Sports Medicine

## 2020-07-21 ENCOUNTER — Other Ambulatory Visit: Payer: Self-pay

## 2020-07-21 VITALS — BP 112/78 | Ht 76.0 in | Wt 197.0 lb

## 2020-07-21 DIAGNOSIS — M4802 Spinal stenosis, cervical region: Secondary | ICD-10-CM

## 2020-07-21 NOTE — Progress Notes (Addendum)
Patient ID: TERAN KNITTLE, male   DOB: 1951-02-02, 69 y.o.   MRN: [id number]  [name] comes in today for follow-up on chronic neck pain secondary to facet arthropathy, degenerative disc disease, spinal stenosis, and rheumatoid arthritis.  He is scheduled for a repeat injection later this month.  Last injection at [facility] was almost one year ago.  I have deferred the decision about facet injection versus epidural steroid injection to the discretion of the interventional radiologist.  For the most part, Jim's symptoms are tolerable on his current treatment.  For his rheumatoid arthritis he is on Inflectra every 8 weeks.  He restarted methotrexate last week.  He also takes 5 mg of prednisone daily.  He has prescriptions for tramadol and hydrocodone for breakthrough pain which he takes responsibly.  He saw Dr. Ellene Route in June and Dr. Ellene Route recommends continued conservative treatment.  Physical exam was not repeated today.  We simply talked about his chronic neck pain.  I feel like he is stable for the most part on our current treatment.  He will proceed with his injection as scheduled.  Follow-up with me as needed.  Total time spent with the patient was 15 minutes with the entire time spent in face-to-face consultation discussing his chronic neck painI

## 2020-07-29 ENCOUNTER — Ambulatory Visit: Payer: Medicare Other | Admitting: Sports Medicine

## 2020-08-04 ENCOUNTER — Other Ambulatory Visit: Payer: Self-pay

## 2020-08-04 ENCOUNTER — Ambulatory Visit
Admission: RE | Admit: 2020-08-04 | Discharge: 2020-08-04 | Disposition: A | Discharge status: Home / Self Care | Payer: Medicare Other | Source: Ambulatory Visit | Attending: Sports Medicine | Admitting: Sports Medicine

## 2020-08-04 DIAGNOSIS — M4802 Spinal stenosis, cervical region: Secondary | ICD-10-CM | POA: Diagnosis not present

## 2020-08-04 MED ORDER — TRIAMCINOLONE ACETONIDE 40 MG/ML IJ SUSP (RADIOLOGY)
60.0000 mg | Freq: Once | INTRAMUSCULAR | Status: AC
  Administered 2020-08-04: 60 mg via EPIDURAL

## 2020-08-04 MED ORDER — IOPAMIDOL (ISOVUE-M 300) INJECTION 61%
1.0000 mL | Freq: Once | INTRAMUSCULAR | Status: AC | PRN
  Administered 2020-08-04: 1 mL via EPIDURAL

## 2020-08-04 NOTE — Discharge Instructions (Signed)

## 2020-08-19 DIAGNOSIS — Z23 Encounter for immunization: Secondary | ICD-10-CM | POA: Diagnosis not present

## 2020-08-25 ENCOUNTER — Other Ambulatory Visit: Payer: Self-pay

## 2020-08-27 ENCOUNTER — Other Ambulatory Visit: Payer: Self-pay | Admitting: Sports Medicine

## 2020-08-27 MED ORDER — TRAMADOL HCL 50 MG PO TABS: ORAL_TABLET | ORAL | 0 refills | Status: DC

## 2020-08-29 DIAGNOSIS — C61 Malignant neoplasm of prostate: Secondary | ICD-10-CM | POA: Diagnosis not present

## 2020-09-03 DIAGNOSIS — M0579 Rheumatoid arthritis with rheumatoid factor of multiple sites without organ or systems involvement: Secondary | ICD-10-CM | POA: Diagnosis not present

## 2020-09-03 DIAGNOSIS — Z79899 Other long term (current) drug therapy: Secondary | ICD-10-CM | POA: Diagnosis not present

## 2020-09-05 DIAGNOSIS — C61 Malignant neoplasm of prostate: Secondary | ICD-10-CM | POA: Diagnosis not present

## 2020-09-05 DIAGNOSIS — N5201 Erectile dysfunction due to arterial insufficiency: Secondary | ICD-10-CM | POA: Diagnosis not present

## 2020-09-05 DIAGNOSIS — N393 Stress incontinence (female) (male): Secondary | ICD-10-CM | POA: Diagnosis not present

## 2020-09-08 ENCOUNTER — Other Ambulatory Visit: Payer: Self-pay

## 2020-09-09 ENCOUNTER — Other Ambulatory Visit: Payer: Self-pay | Admitting: Sports Medicine

## 2020-09-09 MED ORDER — HYDROCODONE-ACETAMINOPHEN 10-325 MG PO TABS: ORAL_TABLET | ORAL | 0 refills | Status: DC

## 2020-09-16 DIAGNOSIS — J019 Acute sinusitis, unspecified: Secondary | ICD-10-CM | POA: Diagnosis not present

## 2020-09-16 DIAGNOSIS — Z20822 Contact with and (suspected) exposure to covid-19: Secondary | ICD-10-CM | POA: Diagnosis not present

## 2020-10-02 DIAGNOSIS — Z961 Presence of intraocular lens: Secondary | ICD-10-CM | POA: Diagnosis not present

## 2020-10-02 DIAGNOSIS — H16223 Keratoconjunctivitis sicca, not specified as Sjogren's, bilateral: Secondary | ICD-10-CM | POA: Diagnosis not present

## 2020-10-02 DIAGNOSIS — H0102A Squamous blepharitis right eye, upper and lower eyelids: Secondary | ICD-10-CM | POA: Diagnosis not present

## 2020-10-02 DIAGNOSIS — H0102B Squamous blepharitis left eye, upper and lower eyelids: Secondary | ICD-10-CM | POA: Diagnosis not present

## 2020-10-02 DIAGNOSIS — H40053 Ocular hypertension, bilateral: Secondary | ICD-10-CM | POA: Diagnosis not present

## 2020-10-02 DIAGNOSIS — H10413 Chronic giant papillary conjunctivitis, bilateral: Secondary | ICD-10-CM | POA: Diagnosis not present

## 2020-10-02 DIAGNOSIS — H35371 Puckering of macula, right eye: Secondary | ICD-10-CM | POA: Diagnosis not present

## 2020-10-18 DIAGNOSIS — M6790 Unspecified disorder of synovium and tendon, unspecified site: Secondary | ICD-10-CM

## 2020-10-18 HISTORY — DX: Unspecified disorder of synovium and tendon, unspecified site: M67.90

## 2020-10-27 ENCOUNTER — Other Ambulatory Visit: Payer: Self-pay

## 2020-10-28 ENCOUNTER — Other Ambulatory Visit: Payer: Self-pay | Admitting: Sports Medicine

## 2020-10-29 ENCOUNTER — Other Ambulatory Visit: Payer: Self-pay | Admitting: Sports Medicine

## 2020-10-29 DIAGNOSIS — Z79899 Other long term (current) drug therapy: Secondary | ICD-10-CM | POA: Diagnosis not present

## 2020-10-29 DIAGNOSIS — M0579 Rheumatoid arthritis with rheumatoid factor of multiple sites without organ or systems involvement: Secondary | ICD-10-CM | POA: Diagnosis not present

## 2020-10-29 MED ORDER — TRAMADOL HCL 50 MG PO TABS: ORAL_TABLET | ORAL | 0 refills | Status: DC

## 2020-11-10 ENCOUNTER — Telehealth: Payer: Self-pay | Admitting: *Deleted

## 2020-11-10 ENCOUNTER — Other Ambulatory Visit: Payer: Self-pay

## 2020-11-10 DIAGNOSIS — M5412 Radiculopathy, cervical region: Secondary | ICD-10-CM

## 2020-11-10 NOTE — Telephone Encounter (Signed)
Order placed

## 2020-11-11 ENCOUNTER — Other Ambulatory Visit: Payer: Self-pay | Admitting: Sports Medicine

## 2020-11-11 DIAGNOSIS — M4802 Spinal stenosis, cervical region: Secondary | ICD-10-CM

## 2020-11-11 DIAGNOSIS — M5412 Radiculopathy, cervical region: Secondary | ICD-10-CM

## 2020-11-12 ENCOUNTER — Other Ambulatory Visit: Payer: Self-pay | Admitting: Sports Medicine

## 2020-11-12 MED ORDER — HYDROCODONE-ACETAMINOPHEN 10-325 MG PO TABS: ORAL_TABLET | ORAL | 0 refills | Status: DC

## 2020-11-19 ENCOUNTER — Other Ambulatory Visit: Payer: Self-pay

## 2020-11-19 ENCOUNTER — Inpatient Hospital Stay: Admission: RE | Admit: 2020-11-19 | Payer: Medicare Other | Source: Ambulatory Visit

## 2020-11-19 ENCOUNTER — Ambulatory Visit
Admission: RE | Admit: 2020-11-19 | Discharge: 2020-11-19 | Disposition: A | Discharge status: Home / Self Care | Payer: Medicare Other | Source: Ambulatory Visit | Attending: Sports Medicine | Admitting: Sports Medicine

## 2020-11-19 ENCOUNTER — Inpatient Hospital Stay
Admission: RE | Admit: 2020-11-19 | Discharge: 2020-11-19 | Disposition: A | Discharge status: Home / Self Care | Payer: Medicare Other | Source: Ambulatory Visit | Attending: Sports Medicine | Admitting: Sports Medicine

## 2020-11-19 DIAGNOSIS — M4802 Spinal stenosis, cervical region: Secondary | ICD-10-CM

## 2020-11-19 DIAGNOSIS — M5412 Radiculopathy, cervical region: Secondary | ICD-10-CM

## 2020-11-19 MED ORDER — IOPAMIDOL (ISOVUE-M 300) INJECTION 61%: 1.0000 mL | Freq: Once | INTRAMUSCULAR | Status: DC | PRN

## 2020-11-19 MED ORDER — DEXAMETHASONE SODIUM PHOSPHATE 4 MG/ML IJ SOLN: 6.0000 mg | Freq: Once | INTRAMUSCULAR | Status: DC

## 2020-11-19 NOTE — Discharge Instructions (Signed)

## 2020-11-19 NOTE — Discharge Instructions (Signed)

## 2020-11-28 DIAGNOSIS — H209 Unspecified iridocyclitis: Secondary | ICD-10-CM | POA: Diagnosis not present

## 2020-11-28 DIAGNOSIS — M7711 Lateral epicondylitis, right elbow: Secondary | ICD-10-CM | POA: Diagnosis not present

## 2020-11-28 DIAGNOSIS — R42 Dizziness and giddiness: Secondary | ICD-10-CM | POA: Diagnosis not present

## 2020-11-28 DIAGNOSIS — M150 Primary generalized (osteo)arthritis: Secondary | ICD-10-CM | POA: Diagnosis not present

## 2020-11-28 DIAGNOSIS — M7989 Other specified soft tissue disorders: Secondary | ICD-10-CM | POA: Diagnosis not present

## 2020-11-28 DIAGNOSIS — Z6825 Body mass index (BMI) 25.0-25.9, adult: Secondary | ICD-10-CM | POA: Diagnosis not present

## 2020-11-28 DIAGNOSIS — H9313 Tinnitus, bilateral: Secondary | ICD-10-CM | POA: Diagnosis not present

## 2020-11-28 DIAGNOSIS — M109 Gout, unspecified: Secondary | ICD-10-CM | POA: Diagnosis not present

## 2020-11-28 DIAGNOSIS — M0579 Rheumatoid arthritis with rheumatoid factor of multiple sites without organ or systems involvement: Secondary | ICD-10-CM | POA: Diagnosis not present

## 2020-11-28 DIAGNOSIS — E663 Overweight: Secondary | ICD-10-CM | POA: Diagnosis not present

## 2020-11-28 DIAGNOSIS — C61 Malignant neoplasm of prostate: Secondary | ICD-10-CM | POA: Diagnosis not present

## 2020-12-03 ENCOUNTER — Other Ambulatory Visit: Payer: Self-pay

## 2020-12-03 DIAGNOSIS — M5412 Radiculopathy, cervical region: Secondary | ICD-10-CM

## 2020-12-09 ENCOUNTER — Other Ambulatory Visit: Payer: Self-pay

## 2020-12-09 ENCOUNTER — Ambulatory Visit (INDEPENDENT_AMBULATORY_CARE_PROVIDER_SITE_OTHER): Payer: Medicare Other | Admitting: Sports Medicine

## 2020-12-09 VITALS — BP 126/88 | Ht 76.0 in | Wt 199.0 lb

## 2020-12-09 DIAGNOSIS — M069 Rheumatoid arthritis, unspecified: Secondary | ICD-10-CM

## 2020-12-09 DIAGNOSIS — M542 Cervicalgia: Secondary | ICD-10-CM

## 2020-12-09 DIAGNOSIS — M4802 Spinal stenosis, cervical region: Secondary | ICD-10-CM

## 2020-12-10 NOTE — Progress Notes (Signed)
Patient ID: Isaiah Price, male   DOB: 11/22/1950, 70 y.o.   MRN: [id number]  Isaiah Price comes in today to discuss a couple of different medical issues.  He recently underwent repeat facet injections in his neck for cervical facet arthropathy secondary to rheumatoid arthritis.  He does continue to get benefit from these.  However, he has persistent occipital headaches which are likely secondary to his neck arthritis.  He recently saw a rheumatology,  and they referred him to neurology after he was complaining of dizziness and difficulty with balance.  He is scheduled with Dr. Jannifer Franklin but wanted my opinion first.  I told Isaiah Price that I agree with the referral to neurology.  I think it will be beneficial on two fronts.  First would be the obvious balance troubles and dizziness, but they may also be able to offer him injections for his occipital headaches.  He understands.  He will continue with regular follow-ups with rheumatology as well.  I will continue to refill his medications as needed for his cervical spine pain.

## 2020-12-18 DIAGNOSIS — R42 Dizziness and giddiness: Secondary | ICD-10-CM | POA: Diagnosis not present

## 2020-12-18 DIAGNOSIS — H9313 Tinnitus, bilateral: Secondary | ICD-10-CM | POA: Diagnosis not present

## 2020-12-18 DIAGNOSIS — R2689 Other abnormalities of gait and mobility: Secondary | ICD-10-CM | POA: Diagnosis not present

## 2020-12-18 DIAGNOSIS — H903 Sensorineural hearing loss, bilateral: Secondary | ICD-10-CM | POA: Diagnosis not present

## 2020-12-18 DIAGNOSIS — H6123 Impacted cerumen, bilateral: Secondary | ICD-10-CM | POA: Diagnosis not present

## 2020-12-19 DIAGNOSIS — H93A9 Pulsatile tinnitus, unspecified ear: Secondary | ICD-10-CM | POA: Diagnosis not present

## 2020-12-19 DIAGNOSIS — R42 Dizziness and giddiness: Secondary | ICD-10-CM | POA: Diagnosis not present

## 2020-12-19 DIAGNOSIS — R2689 Other abnormalities of gait and mobility: Secondary | ICD-10-CM | POA: Diagnosis not present

## 2020-12-24 ENCOUNTER — Other Ambulatory Visit: Payer: Self-pay

## 2020-12-24 DIAGNOSIS — L578 Other skin changes due to chronic exposure to nonionizing radiation: Secondary | ICD-10-CM | POA: Diagnosis not present

## 2020-12-24 DIAGNOSIS — Z808 Family history of malignant neoplasm of other organs or systems: Secondary | ICD-10-CM | POA: Diagnosis not present

## 2020-12-24 DIAGNOSIS — D225 Melanocytic nevi of trunk: Secondary | ICD-10-CM | POA: Diagnosis not present

## 2020-12-24 DIAGNOSIS — L821 Other seborrheic keratosis: Secondary | ICD-10-CM | POA: Diagnosis not present

## 2020-12-24 DIAGNOSIS — L570 Actinic keratosis: Secondary | ICD-10-CM | POA: Diagnosis not present

## 2020-12-25 DIAGNOSIS — Z79899 Other long term (current) drug therapy: Secondary | ICD-10-CM | POA: Diagnosis not present

## 2020-12-25 DIAGNOSIS — M0579 Rheumatoid arthritis with rheumatoid factor of multiple sites without organ or systems involvement: Secondary | ICD-10-CM | POA: Diagnosis not present

## 2020-12-26 ENCOUNTER — Other Ambulatory Visit: Payer: Self-pay | Admitting: Sports Medicine

## 2020-12-26 MED ORDER — TRAMADOL HCL 50 MG PO TABS: ORAL_TABLET | ORAL | 0 refills | Status: DC

## 2021-01-02 ENCOUNTER — Encounter: Payer: Self-pay | Admitting: Neurology

## 2021-01-02 ENCOUNTER — Ambulatory Visit (INDEPENDENT_AMBULATORY_CARE_PROVIDER_SITE_OTHER): Payer: Medicare Other | Admitting: Neurology

## 2021-01-02 VITALS — BP 117/77 | HR 101 | Ht 76.0 in | Wt 202.4 lb

## 2021-01-02 DIAGNOSIS — R269 Unspecified abnormalities of gait and mobility: Secondary | ICD-10-CM

## 2021-01-02 DIAGNOSIS — E538 Deficiency of other specified B group vitamins: Secondary | ICD-10-CM

## 2021-01-02 MED ORDER — ALPRAZOLAM 0.5 MG PO TABS: ORAL_TABLET | ORAL | 0 refills | Status: DC

## 2021-01-02 NOTE — Progress Notes (Signed)
Reason for visit: Chronic neck discomfort, gait disorder, rheumatoid arthritis  Referring physician: Dr. Despina Arias is a 70 y.o. male  History of present illness:  Mr. Yurkovich is a 70 year old right-handed white gentleman with a history of rheumatoid arthritis followed through rheumatology.  The patient has had a 15+ year history of neck discomfort and he has been treated fairly effectively through Dr. Micheline Chapman.  The patient gets fairly regular injections with epidural steroids or facet joint injections, these injections seem to significant benefit the overall pain level with the patient.  The patient tends to get discomfort in the base of the skull on the left but at the issue is not treated, the pain will spread into the left shoulder and into the upper arm on the left with severe pain and soreness and some numbness.  The patient also notes that his right hand is somewhat clumsy, he is right-handed and has difficulty picking up small objects or managing EpiPen or a pencil.  The patient reports that particularly over the last 6 months he has noted some worsening of his balance.  When he walks he has a tendency to veer to the right and he will tend to stumble when he makes a turn.  He has a sensation of lightheadedness or a sensation of imbalance.  If he reaches over and touches a wall or chair this improves the sensation.  He has seen Dr. Thornell Mule from ENT for a vestibular evaluation.  This evaluation is ongoing.  The patient has not had any significant issues with bladder control but he does have some urgency following prostate surgery.  He does have some problems with constipation.  He reports no significant back pain, he does have some low back pain on occasion.  He denies any numbness or tingling in the feet.  He has had EMG and nerve conduction study done a year ago by Dr. Posey Pronto, this revealed evidence of a right ulnar neuropathy and a low-grade right C7 radiculopathy.  MRI evaluations of the  cervical spine do show multilevel degenerative changes, but no significant spinal stenosis or spinal cord compression.  Past Medical History:  Diagnosis Date  . Achilles tendinitis, left leg   . At risk for sleep apnea    STOP--BANG SCORE= 5  (routed to pt's pcp in epic 11-13-2018)  . Chronic insomnia 12/31/2015  . Chronic neck pain   . Congenital pectus excavatum    per pt had pulmonary test ,  60% restrictive by states asymptomatic  . DDD (degenerative disc disease), cervical   . Epicondylitis, lateral, right   . GERD (gastroesophageal reflux disease)   . History of exercise intolerance    09-02-2017  ETT,  negative for ischemia  . History of pericarditis 1985  . HTN (hypertension)   . Hypercholesteremia 12/31/2015  . Iritis   . IT band syndrome 05/06/2014  . Lower urinary tract symptoms (LUTS)   . Neuropathy    bilateral arms with tingling and numbness at times due to cervical neck  . OA (osteoarthritis)   . Polyarthralgia   . Prostate cancer [facility])    urologist-  dr Alinda Money--- Stage T1c, Gleason 3+4  . Rheumatoid arthritis [facility])    rheumatologist-- dr Amil Amen--- seropositive,multiple sites  . Wears glasses     Past Surgical History:  Procedure Laterality Date  . ANTERIOR CERVICAL DECOMP/DISCECTOMY FUSION  1991   dr Sherwood Gambler   C5 -- 7  (per pt fusion failed)  . CARPAL TUNNEL  RELEASE Right done at same time of Guy fusion  . CARPOMETACARPAL ([place]) FUSION OF THUMB Bilateral 2015 and 2014  approx.  Marland Kitchen CATARACT EXTRACTION W/ INTRAOCULAR LENS  IMPLANT, BILATERAL  2013  . LYMPHADENECTOMY Bilateral 11/16/2018   Procedure: LYMPHADENECTOMY, PELVIC;  Surgeon: Raynelle Bring, MD;  Location: [facility];  Service: Urology;  Laterality: Bilateral;  . ROBOT ASSISTED LAPAROSCOPIC RADICAL PROSTATECTOMY N/A 11/16/2018   Procedure: XI ROBOTIC ASSISTED LAPAROSCOPIC RADICAL PROSTATECTOMY LEVEL 2;  Surgeon: Raynelle Bring, MD;  Location: [facility];  Service: Urology;  Laterality: N/A;  . SHOULDER SURGERY  Left age 89  . TONSILLECTOMY  child    Family History  Problem Relation Age of Onset  . Heart disease Father        Aortic Valve Stenosis, CABG 76  . Hypertension Father   . Macular degeneration Mother   . Heart disease Mother        MVR  . Hypertension Mother   . Osteosarcoma Paternal Uncle   . Osteosarcoma Son     Social history:  reports that he quit smoking about 9 years ago. His smoking use included cigarettes. He has a 25.00 pack-year smoking history. He has never used smokeless tobacco. He reports current alcohol use. He reports that he does not use drugs.  Medications:  Prior to Admission medications   Medication Sig Start Date End Date Taking? Authorizing Provider  allopurinol (ZYLOPRIM) 100 MG tablet Take 200 mg by mouth daily. 05/15/19  Yes [provider]  atorvastatin (LIPITOR) 40 MG tablet Take 40 mg by mouth at bedtime.  06/29/18  Yes [provider]  diltiazem (CARDIZEM) 90 MG tablet Take 90 mg by mouth at bedtime.  07/06/16  Yes [provider]  fluticasone (FLONASE) 50 MCG/ACT nasal spray Place 1 spray into the nose daily as needed for allergies.    Yes [provider]  folic acid (FOLVITE) 1 MG tablet Take 1 mg by mouth daily. 10/13/20  Yes [provider]  HYDROcodone-acetaminophen (NORCO) 10-325 MG tablet TAKE 1 TABLET EVERY 6 HOURS AS NEEDED FOR MODERATE TO SEVERE PAIN. 11/12/20  Yes Draper, Timothy R, DO  inFLIXimab-dyyb (INFLECTRA) 100 MG SOLR Inject into the vein.   Yes [provider]  methotrexate 2.5 MG tablet Take 5 tabs Tues only 11/28/20  Yes [provider]  omeprazole (PRILOSEC) 40 MG capsule Take 40 mg by mouth daily.  01/12/17  Yes [provider]  Polyvinyl Alcohol-Povidone (REFRESH OP) Place 1 drop into both eyes 2 (two) times daily as needed (dry eyes).    Yes [provider]  predniSONE (DELTASONE) 5 MG tablet Take 5 mg by mouth daily with breakfast.   Yes [provider]  traMADol (ULTRAM) 50 MG tablet TAKE (1) TABLET EVERY EIGHT HOURS AS NEEDED FOR PAIN. 12/26/20  Yes Draper, Christia Reading R, DO  zolpidem (AMBIEN) 10 MG tablet Take 10 mg by mouth at bedtime.  01/12/17  Yes [provider]  famotidine (PEPCID) 20 MG tablet Take 20 mg by mouth daily as needed for heartburn.  Patient not taking: Reported on 01/02/2021    [provider]  methocarbamol (ROBAXIN) 500 MG tablet TAKE 1 TABLET AT BEDTIME AS NEEDED FOR MUSCLE SPASM. Patient not taking: Reported on 01/02/2021 06/16/20   Thurman Coyer, DO      Allergies  Allergen Reactions  . Nsaids Other (See Comments)    Causes GI bleeds (numerous times)  . Brimonidine Itching and Other (See Comments)  redness Abdominal Pain  . Gabapentin Other (See Comments)    Couldn't talk  . Meloxicam Other (See Comments)    ROS:  Out of a complete 14 system review of symptoms, the patient complains only of the following symptoms, and all other reviewed systems are negative.  Walking difficulty Chronic neck pain  Blood pressure 117/77, pulse (!) 101, height 6\' 4"  (1.93 m), weight 202 lb 6.4 oz (91.8 kg).  Physical Exam  General: The patient is alert and cooperative at the time of the examination.  Eyes: Pupils are equal, round, and reactive to light. Discs are flat bilaterally.  Neck: The neck is supple, no carotid bruits are noted.  Respiratory: The respiratory examination is clear.  Cardiovascular: The cardiovascular examination reveals a regular rate and rhythm, no obvious murmurs or rubs are noted.  Skin: Extremities are without significant edema.  Neurologic Exam  Mental status: The patient is alert and oriented x 3 at the time of the examination. The patient has apparent normal recent and remote memory, with an apparently normal attention span and concentration ability.  Cranial nerves: Facial symmetry is present. There is good sensation of the face to pinprick and soft  touch bilaterally. The strength of the facial muscles and the muscles to head turning and shoulder shrug are normal bilaterally. Speech is well enunciated, no aphasia or dysarthria is noted. Extraocular movements are full. Visual fields are full. The tongue is midline, and the patient has symmetric elevation of the soft palate. No obvious hearing deficits are noted.  Motor: The motor testing reveals 5 over 5 strength of all 4 extremities. Good symmetric motor tone is noted throughout.  Sensory: Sensory testing is intact to pinprick, soft touch, vibration sensation, and position sense on all 4 extremities.  No definite stocking pattern pinprick sensory deficit is noted in the legs.  No evidence of extinction is noted.  Coordination: Cerebellar testing reveals good finger-nose-finger and heel-to-shin bilaterally.  Gait and station: Gait is normal, but he does have a tendency to stagger when he makes a turn. Tandem gait is unsteady. Romberg is negative, but is somewhat unsteady. No drift is seen.  Reflexes: Deep tendon reflexes are symmetric and normal bilaterally, with exception that the ankle jerk reflexes are absent bilaterally. Toes are downgoing bilaterally.   MRI cervical 12/27/19:  IMPRESSION: 1. Mild progression of cervical spine degenerative changes at C3-4 and C4-5, now with mild canal stenosis at these levels. 2. Severe right-sided foraminal stenosis at C4-5 and C6-7. Severe left-sided foraminal stenosis at C3-4. 3. Solid arthrodesis of the C5 and C6 vertebral bodies.  * MRI scan images were reviewed online. I agree with the written report.    Assessment/Plan:  1.  Chronic neck discomfort  2.  Gait disorder  3.  History of rheumatoid arthritis  The patient seems to be getting good pain control through Dr. Micheline Chapman with his neck issue.  He has however developed some issues with gait instability that have worsened particularly over the last 6 months.  The patient will be set up  for MRI of the brain and for blood work today.  Depending upon the results of the above, if no etiology is found for his gait disorder we may consider nerve conduction studies of both legs.  He will otherwise follow-up in 4 months.  Jill Alexanders MD 01/02/2021 9:51 AM  [facility] [address] [place], [place] [address]  Phone [phone] Fax [phone]

## 2021-01-05 ENCOUNTER — Telehealth: Payer: Self-pay | Admitting: Neurology

## 2021-01-05 NOTE — Telephone Encounter (Signed)
Medicare/bcbs supp order sent to GI. No auth they will reach out to the patient to schedule.  

## 2021-01-06 ENCOUNTER — Ambulatory Visit
Admission: RE | Admit: 2021-01-06 | Discharge: 2021-01-06 | Disposition: A | Discharge status: Home / Self Care | Payer: Medicare Other | Source: Ambulatory Visit | Attending: Neurology | Admitting: Neurology

## 2021-01-06 DIAGNOSIS — R269 Unspecified abnormalities of gait and mobility: Secondary | ICD-10-CM | POA: Diagnosis not present

## 2021-01-08 LAB — MULTIPLE MYELOMA PANEL, SERUM
Albumin SerPl Elph-Mcnc: 3.9 g/dL (ref 2.9–4.4)
Albumin/Glob SerPl: 1.3 (ref 0.7–1.7)
Alpha 1: 0.3 g/dL (ref 0.0–0.4)
Alpha2 Glob SerPl Elph-Mcnc: 0.8 g/dL (ref 0.4–1.0)
B-Globulin SerPl Elph-Mcnc: 1.1 g/dL (ref 0.7–1.3)
Gamma Glob SerPl Elph-Mcnc: 0.9 g/dL (ref 0.4–1.8)
Globulin, Total: 3.1 g/dL (ref 2.2–3.9)
IgA/Immunoglobulin A, Serum: 128 mg/dL (ref 61–437)
IgG (Immunoglobin G), Serum: 905 mg/dL (ref 603–1613)
IgM (Immunoglobulin M), Srm: 95 mg/dL (ref 20–172)
Total Protein: 7 g/dL (ref 6.0–8.5)

## 2021-01-08 LAB — B. BURGDORFI ANTIBODIES: Lyme IgG/IgM Ab: <0.91 {ISR} (ref 0.00–0.90)

## 2021-01-08 LAB — RPR: RPR Ser Ql: NONREACTIVE

## 2021-01-08 LAB — VITAMIN B12: Vitamin B-12: 760 pg/mL (ref 232–1245)

## 2021-01-08 LAB — COPPER, SERUM: Copper: 110 ug/dL (ref 69–132)

## 2021-01-08 LAB — ANGIOTENSIN CONVERTING ENZYME: Angio Convert Enzyme: 24 U/L (ref 14–82)

## 2021-01-09 ENCOUNTER — Telehealth: Payer: Self-pay | Admitting: Neurology

## 2021-01-09 NOTE — Telephone Encounter (Signed)
I called the patient.  The MRI of the brain was unremarkable, no explanation for gait disturbance, blood work was also normal.  If the patient was amenable to it we will check nerve conduction studies of the legs.  He will call us if he is okay with having the test.   MRI brain 01/08/21:  IMPRESSION:   Unremarkable MRI brain without contrast.  No acute findings.

## 2021-01-20 ENCOUNTER — Other Ambulatory Visit: Payer: Self-pay

## 2021-01-22 ENCOUNTER — Other Ambulatory Visit: Payer: Self-pay | Admitting: Sports Medicine

## 2021-01-22 ENCOUNTER — Telehealth: Payer: Self-pay | Admitting: Neurology

## 2021-01-22 ENCOUNTER — Other Ambulatory Visit: Payer: Self-pay | Admitting: *Deleted

## 2021-01-22 DIAGNOSIS — E538 Deficiency of other specified B group vitamins: Secondary | ICD-10-CM

## 2021-01-22 DIAGNOSIS — M5412 Radiculopathy, cervical region: Secondary | ICD-10-CM

## 2021-01-22 DIAGNOSIS — R269 Unspecified abnormalities of gait and mobility: Secondary | ICD-10-CM

## 2021-01-22 DIAGNOSIS — G5621 Lesion of ulnar nerve, right upper limb: Secondary | ICD-10-CM

## 2021-01-22 DIAGNOSIS — M4802 Spinal stenosis, cervical region: Secondary | ICD-10-CM

## 2021-01-22 MED ORDER — HYDROCODONE-ACETAMINOPHEN 10-325 MG PO TABS
ORAL_TABLET | ORAL | 0 refills | Status: DC
Start: 2021-01-22 — End: 2021-03-19

## 2021-01-22 NOTE — Telephone Encounter (Signed)
I placed EMG/NCS order. Can you please reach out to patient to get him scheduled? Thank you

## 2021-01-22 NOTE — Telephone Encounter (Signed)
Pt has called in response to the mychart message from Dr Jannifer Franklin.  Pt is willing to have the NCS/EMG.  Phone rep told pt she would message RN to have the order put in the system so we can go about scheduling the NCS/EMG.  Pt welcomes a call back if there are questions for him

## 2021-02-09 ENCOUNTER — Telehealth: Payer: Self-pay

## 2021-02-09 ENCOUNTER — Encounter: Payer: Medicare Other | Admitting: Neurology

## 2021-02-09 NOTE — Telephone Encounter (Signed)
Patient left a voicemail this morning advising [place] that he is quite sick and will be unable to attend his appointments this afternoon with Dr. Jannifer Franklin.  He will call back to reschedule.

## 2021-02-09 NOTE — Telephone Encounter (Signed)
Noted, thanks!

## 2021-02-10 NOTE — Telephone Encounter (Signed)
Called patient to r/s, lvm letting him know I scheduled him for the next available and if it didn't work for him to call us back or send another [employer]. Also replied to his [employer].

## 2021-02-13 ENCOUNTER — Ambulatory Visit: Payer: Medicare Other | Admitting: Neurology

## 2021-02-24 ENCOUNTER — Ambulatory Visit (INDEPENDENT_AMBULATORY_CARE_PROVIDER_SITE_OTHER): Payer: Medicare Other | Admitting: Neurology

## 2021-02-24 ENCOUNTER — Encounter: Payer: Self-pay | Admitting: Neurology

## 2021-02-24 DIAGNOSIS — R2689 Other abnormalities of gait and mobility: Secondary | ICD-10-CM

## 2021-02-24 DIAGNOSIS — G5621 Lesion of ulnar nerve, right upper limb: Secondary | ICD-10-CM

## 2021-02-24 DIAGNOSIS — M4802 Spinal stenosis, cervical region: Secondary | ICD-10-CM

## 2021-02-24 DIAGNOSIS — G894 Chronic pain syndrome: Secondary | ICD-10-CM

## 2021-02-24 DIAGNOSIS — R269 Unspecified abnormalities of gait and mobility: Secondary | ICD-10-CM

## 2021-02-24 DIAGNOSIS — M5412 Radiculopathy, cervical region: Secondary | ICD-10-CM

## 2021-02-24 DIAGNOSIS — E538 Deficiency of other specified B group vitamins: Secondary | ICD-10-CM

## 2021-02-24 NOTE — Procedures (Signed)
     HISTORY:  Isaiah Price is a 70 year old gentleman with history of a mild gait disorder.  He has a tendency to want to go backwards if he stands up straight.  He feels imbalanced.  He has been evaluated for possible peripheral neuropathy.  NERVE CONDUCTION STUDIES:  Nerve conduction studies were performed on both lower extremities. The distal motor latencies and motor amplitudes for the peroneal and posterior tibial nerves were within normal limits. The nerve conduction velocities for these nerves were also normal. The sensory latencies for the peroneal and sural nerves were within normal limits. The F wave latencies for the posterior tibial nerves were prolonged bilaterally.  The patient is 6 feet 4 inches tall.   EMG STUDIES:  EMG study was performed on the right lower extremity:  The tibialis anterior muscle reveals 2 to 4K motor units with full recruitment. No fibrillations or positive waves were seen. The peroneus tertius muscle reveals 2 to 4K motor units with full recruitment. No fibrillations or positive waves were seen. The medial gastrocnemius muscle reveals 1 to 3K motor units with full recruitment. No fibrillations or positive waves were seen. The vastus lateralis muscle reveals 2 to 4K motor units with full recruitment. No fibrillations or positive waves were seen. The iliopsoas muscle reveals 2 to 4K motor units with full recruitment. No fibrillations or positive waves were seen. The biceps femoris muscle (long head) reveals 2 to 4K motor units with full recruitment. No fibrillations or positive waves were seen. The lumbosacral paraspinal muscles were tested at 3 levels, and revealed no abnormalities of insertional activity at all 3 levels tested. There was good relaxation.   IMPRESSION:  Nerve conduction studies performed on both lower extremities were relatively unremarkable, no evidence of a peripheral neuropathy was seen.  EMG evaluation of the right lower extremity  was unremarkable, no evidence of an overlying lumbar radiculopathy was seen.  Jill Alexanders MD 02/24/2021 3:47 PM  [facility] [address] [place], [place] [address]  Phone [phone] Fax [phone]

## 2021-02-24 NOTE — Progress Notes (Addendum)
The patient comes in for EMG and nerve conduction study evaluation which is unremarkable, no evidence of neuropathy is seen that would explain his gait disturbance.  The patient's had unremarkable blood work, MRI of the brain as well.  He has had a vestibular evaluation through Dr. Thornell Mule, there was some issue with saccadic pursuit movements but no definite vestibular disease was identified.  I will get the patient set up for balance training through physical therapy.    [place]    Nerve / Sites Muscle Latency Ref. Amplitude Ref. Rel Amp Segments Distance Velocity Ref. Area    ms ms mV mV %  cm m/s m/s mVms  L Peroneal - EDB     Ankle EDB 3.9 ?6.5 5.0 ?2.0 100 Ankle - EDB 9   15.8     Fib head EDB 11.0  4.4  87.2 Fib head - Ankle 31 44 ?44 15.2     Pop fossa EDB 13.3  4.2  96.7 Pop fossa - Fib head 10 44 ?44 15.2         Pop fossa - Ankle      R Peroneal - EDB     Ankle EDB 3.8 ?6.5 5.8 ?2.0 100 Ankle - EDB 9   18.1     Fib head EDB 11.1  4.8  83.2 Fib head - Ankle 32 44 ?44 18.0     Pop fossa EDB 13.4  4.7  96.6 Pop fossa - Fib head 10 44 ?44 17.9         Pop fossa - Ankle      L Tibial - AH     Ankle AH 3.4 ?5.8 10.9 ?4.0 100 Ankle - AH 9   27.2     Pop fossa AH 13.8  7.8  71.8 Pop fossa - Ankle 43 41 ?41 29.2  R Tibial - AH     Ankle AH 3.8 ?5.8 9.2 ?4.0 100 Ankle - AH 9   24.0     Pop fossa AH 14.5  7.2  79 Pop fossa - Ankle 44 41 ?41 22.0             SNC    Nerve / Sites Rec. Site Peak Lat Ref.  Amp Ref. Segments Distance    ms ms V V  cm  L Sural - Ankle (Calf)     Calf Ankle 3.8 ?4.4 7 ?6 Calf - Ankle 14  R Sural - Ankle (Calf)     Calf Ankle 3.9 ?4.4 7 ?6 Calf - Ankle 14  L Superficial peroneal - Ankle     Lat leg Ankle 3.5 ?4.4 2 ?6 Lat leg - Ankle 14  R Superficial peroneal - Ankle     Lat leg Ankle 3.4 ?4.4 2 ?6 Lat leg - Ankle 14             F  Wave    Nerve F Lat Ref.   ms ms  L Tibial - AH 59.0 ?56.0  R Tibial - AH 60.2 ?56.0

## 2021-02-24 NOTE — Progress Notes (Signed)
Please refer to EMG and nerve conduction procedure note.  

## 2021-03-11 ENCOUNTER — Ambulatory Visit: Payer: Medicare Other | Attending: Neurology

## 2021-03-11 ENCOUNTER — Other Ambulatory Visit: Payer: Self-pay

## 2021-03-11 ENCOUNTER — Other Ambulatory Visit (HOSPITAL_COMMUNITY): Payer: Self-pay | Admitting: Urology

## 2021-03-11 VITALS — BP 124/82

## 2021-03-11 DIAGNOSIS — R2689 Other abnormalities of gait and mobility: Secondary | ICD-10-CM | POA: Insufficient documentation

## 2021-03-11 DIAGNOSIS — R2681 Unsteadiness on feet: Secondary | ICD-10-CM | POA: Diagnosis present

## 2021-03-11 DIAGNOSIS — C61 Malignant neoplasm of prostate: Secondary | ICD-10-CM

## 2021-03-11 DIAGNOSIS — M6281 Muscle weakness (generalized): Secondary | ICD-10-CM | POA: Insufficient documentation

## 2021-03-11 NOTE — Therapy (Signed)
[facility] [address] [place], [place], [address] Phone: [phone]   Fax:  [phone]  Physical Therapy Evaluation  Patient Details  Name: Isaiah Price MRN: [id number] Date of Birth: 1951-07-13 Referring Provider (PT): Margette Fast   Encounter Date: 03/11/2021   PT End of Session - 03/11/21 1018    Visit Number 1    Number of Visits 17    Date for PT Re-Evaluation 06/09/21   60 day poc, 90 day cert   Authorization Type Medicare so 10th visit progress note    PT Start Time 1016    PT Stop Time 1102    PT Time Calculation (min) 46 min    Equipment Utilized During Treatment Gait belt    Activity Tolerance Patient tolerated treatment well    Behavior During Therapy [facility] for tasks assessed/performed           Past Medical History:  Diagnosis Date  . Achilles tendinitis, left leg   . At risk for sleep apnea    STOP--BANG SCORE= 5  (routed to pt's pcp in epic 11-13-2018)  . Chronic insomnia 12/31/2015  . Chronic neck pain   . Congenital pectus excavatum    per pt had pulmonary test ,  60% restrictive by states asymptomatic  . DDD (degenerative disc disease), cervical   . Epicondylitis, lateral, right   . GERD (gastroesophageal reflux disease)   . History of exercise intolerance    09-02-2017  ETT,  negative for ischemia  . History of pericarditis 1985  . HTN (hypertension)   . Hypercholesteremia 12/31/2015  . Iritis   . IT band syndrome 05/06/2014  . Lower urinary tract symptoms (LUTS)   . Neuropathy    bilateral arms with tingling and numbness at times due to cervical neck  . OA (osteoarthritis)   . Polyarthralgia   . Prostate cancer [facility])    urologist-  dr Alinda Money--- Stage T1c, Gleason 3+4  . Rheumatoid arthritis [facility])    rheumatologist-- dr Amil Amen--- seropositive,multiple sites  . Wears glasses     Past Surgical History:  Procedure Laterality Date  . ANTERIOR CERVICAL DECOMP/DISCECTOMY FUSION   1991   dr Sherwood Gambler   C5 -- 7  (per pt fusion failed)  . CARPAL TUNNEL RELEASE Right done at same time of [place] fusion  . CARPOMETACARPAL ([place]) FUSION OF THUMB Bilateral 2015 and 2014  approx.  Marland Kitchen CATARACT EXTRACTION W/ INTRAOCULAR LENS  IMPLANT, BILATERAL  2013  . LYMPHADENECTOMY Bilateral 11/16/2018   Procedure: LYMPHADENECTOMY, PELVIC;  Surgeon: Raynelle Bring, MD;  Location: [facility];  Service: Urology;  Laterality: Bilateral;  . ROBOT ASSISTED LAPAROSCOPIC RADICAL PROSTATECTOMY N/A 11/16/2018   Procedure: XI ROBOTIC ASSISTED LAPAROSCOPIC RADICAL PROSTATECTOMY LEVEL 2;  Surgeon: Raynelle Bring, MD;  Location: [facility];  Service: Urology;  Laterality: N/A;  . SHOULDER SURGERY Left age 3  . TONSILLECTOMY  child    Vitals:   03/11/21 1019 03/11/21 1044  BP: 128/84 124/82      Subjective Assessment - 03/11/21 1019    Subjective Pt presents to PT with referral for gait abnormality. Pt reports he is having issues with his balance. Has been through a lot of testing recently including EMG, nerve conduction, vestibular testing from audiologist. No specific issues noted. Neuropathy was ruled out in legs. Does have some in arms due to history of neck issues. Rotary chair at audiologist was slightly abnormal but all other testing WNL. Pt reports that last 6 months has been  more noticeable to him. Finds in shower with eyes closed or turning makes him feel unsteady. Finds that crouching down feels unsteady. Seems to go either posterior or to the right. Often bumps in to doorways on the right. Pt denies any recent vision changes. He does wear glasses and has history of dry eyes. He reports he does feel a little lightheaded and has some "brain fog" at times. No specific issues with any sort of dizziness or room spinning. Feels more like he has had too much to drink. Denies any falls.    Pertinent History PMH: rheumatoid arthritis, chronic neck pain, HTN, neuropathy in arms from neck, C5-7 ACDF, pectus excavatum  which does contribute some to SOB and reports his FEV1 was decreased on testing, prostate cancer with history of surgery but just found reoccurance on bloodwork last week. Waiting for CT currently.    Patient Stated Goals Pt wants to not feel lightheaded and feel steadier on feet.    Currently in Pain? Yes    Pain Score 3     Pain Location Neck    Pain Orientation Left    Pain Descriptors / Indicators Aching;Burning    Pain Radiating Towards stays at base of skull    Pain Onset More than a month ago    Pain Frequency Constant    Pain Relieving Factors neck injections a couple times a year that really help. PT not adddressing neck pain at this time.              [facility] PT Assessment - 03/11/21 1028      Assessment   Medical Diagnosis gait abnormality    Referring Provider (PT) Margette Fast    Onset Date/Surgical Date 02/24/21    Hand Dominance Right    Prior Therapy no      Precautions   Precautions Fall      Balance Screen   Has the patient fallen in the past 6 months No    Has the patient had a decrease in activity level because of a fear of falling?  No    Is the patient reluctant to leave their home because of a fear of falling?  No      Home Ecologist residence    Living Arrangements Spouse/significant other    Available Help at Discharge Family    Type of [place] to enter    Entrance Stairs-Number of Steps 2    Entrance Stairs-Rails None    Home Layout One level    [place] None      Prior Function   Level of Independence Independent with community mobility without device    Vocation Retired    Leisure yard work, Copy   Overall Cognitive Status Within [place] for tasks assessed    Memory --   does feel like short term memory has not been as good     Observation/Other Assessments   Observations Peripheral fields intact for vision, smooth pursuit intact, saccades  intact. Pt does report that eyes seem to flutter when reading at times and has to pause until stops to continue.      Sensation   Light Touch Appears Intact    Additional Comments Pt reports he does get some numbness and tingling in hands. Has history of thumb joint replacement      ROM / Strength   AROM / PROM / Strength  Strength      Strength   Strength Assessment Site Hip;Knee;Ankle    Right/Left Hip Right;Left    Right Hip Flexion 5/5    Right Hip Extension 4+/5    Right Hip ABduction 4+/5    Left Hip Flexion 4/5    Left Hip Extension 4/5    Left Hip ABduction 4/5    Right/Left Knee Right;Left    Right Knee Flexion 5/5    Right Knee Extension 5/5    Left Knee Flexion 4/5    Left Knee Extension 4/5    Right/Left Ankle Right;Left    Right Ankle Dorsiflexion 4+/5    Left Ankle Dorsiflexion 4/5      Transfers   Transfers Sit to Stand;Stand to Sit    Sit to Stand 5: Supervision    Five time sit to stand comments  18.53 sec from chair without hands    Stand to Sit 5: Supervision      Ambulation/Gait   Ambulation/Gait Yes    Ambulation/Gait Assistance 5: Supervision;4: Min guard    Ambulation/Gait Assistance Details Pt does tend to stagger some to right at times with narrow BOS on right crossing midline some.    Ambulation Distance (Feet) 100 Feet    Assistive device None    Gait Pattern Step-through pattern;Narrow base of support    Ambulation Surface Level;Indoor    Gait velocity 8.08 sec=1.76m/s    Stairs Yes    Stairs Assistance 5: Supervision    Stair Management Technique No rails;One rail Right;Alternating pattern    Number of Stairs 4    Height of Stairs 6      High Level Balance   High Level Balance Comments MCTSIB 120 sec      Functional Gait  Assessment   Gait assessed  Yes    Gait Level Surface Walks 20 ft in less than 5.5 sec, no assistive devices, good speed, no evidence for imbalance, normal gait pattern, deviates no more than 6 in outside of the 12 in  walkway width.    Change in Gait Speed Able to smoothly change walking speed without loss of balance or gait deviation. Deviate no more than 6 in outside of the 12 in walkway width.    Gait with Horizontal Head Turns Performs head turns smoothly with slight change in gait velocity (eg, minor disruption to smooth gait path), deviates 6-10 in outside 12 in walkway width, or uses an assistive device.    Gait with Vertical Head Turns Performs task with slight change in gait velocity (eg, minor disruption to smooth gait path), deviates 6 - 10 in outside 12 in walkway width or uses assistive device    Gait and Pivot Turn Pivot turns safely in greater than 3 sec and stops with no loss of balance, or pivot turns safely within 3 sec and stops with mild imbalance, requires small steps to catch balance.    Step Over Obstacle Is able to step over 2 stacked shoe boxes taped together (9 in total height) without changing gait speed. No evidence of imbalance.    Gait with Narrow Base of Support Ambulates less than 4 steps heel to toe or cannot perform without assistance.    Gait with Eyes Closed Cannot walk 20 ft without assistance, severe gait deviations or imbalance, deviates greater than 15 in outside 12 in walkway width or will not attempt task.    Ambulating Backwards Walks 20 ft, uses assistive device, slower speed, mild gait deviations, deviates 6-10  in outside 12 in walkway width.    Steps Alternating feet, must use rail.    Total Score 19                      Objective measurements completed on examination: See above findings.               PT Education - 03/11/21 1545    Education Details PT poc    Person(s) Educated Patient    Methods Explanation    Comprehension Verbalized understanding            PT Short Term Goals - 03/11/21 1556      PT SHORT TERM GOAL #1   Title Pt will be independent with balance HEP to continue gains at home.    Time 4    Period Weeks     Status New    Target Date 04/10/21      PT SHORT TERM GOAL #2   Title Pt will ambulate >500' on level surfaces independently with no significant staggering noted.    Time 4    Period Weeks    Status New    Target Date 04/10/21      PT SHORT TERM GOAL #3   Title Pt will increase FGA from 19 to >23/30 for improved balance and gait safety.    Baseline 03/11/21 19/30    Time 4    Period Weeks    Status New    Target Date 04/10/21             PT Long Term Goals - 03/11/21 1558      PT LONG TERM GOAL #1   Title Pt will ambulate up/down 8 steps with reciprocal pattern without rails independently for improved community access.    Time 8    Period Weeks    Status New    Target Date 05/10/21      PT LONG TERM GOAL #2   Title Pt will decrease 5 x sit to stand from 18.53 sec to <15 sec for improved balance and functional strength.    Baseline 03/11/21 18.53 sec from chair without hands    Time 8    Period Weeks    Status New    Target Date 05/10/21      PT LONG TERM GOAL #3   Title Pt will ambulate >1000' on varied outdoor surfaces mod I with LRAD versus no device for improved community mobility.    Time 8    Period Weeks    Status New    Target Date 05/10/21      PT LONG TERM GOAL #4   Title Pt will increase FGA to >26/30 for improved balance and gait safety.    Baseline 03/11/21 19/30    Time 8    Period Weeks    Status New    Target Date 05/10/21      PT LONG TERM GOAL #5   Title Pt will be able to perform simulated gardening tasks with bending over without LOB for improved function.    Time 8    Period Weeks    Status New    Target Date 05/10/21                  Plan - 03/11/21 1546    Clinical Impression Statement Pt is 70 y/o male who presents with impaired balance and gait that has progressively gotten worse over last 6 months. No specific reason  has been found with extensive testing. Pt denies any dizziness just feeling unsteady or lightheaded.  Reports he notices more with eyes closed, quick turns or bending foward. Pt is fall risk based on 5 x sit to stand of 18.53 sec and FGA of 19/30 places at moderate fall risk. He was able to complete the MCTSIB but does have increased posterior sway at times. Pt ambulating at safe community gait speed of 1.81m/s but he does have narrow BOS and staggers at times. Pt's LLE was slightly weaker than right with 4/5 throughout. Pt will benefit from skilled PT to address strength, balance and functional mobility deficits.    Personal Factors and Comorbidities Comorbidity 3+    Comorbidities rheumatoid arthritis, chronic neck pain, HTN, neuropathy in arms from neck, C5-7 ACDF    Examination-Activity Limitations Locomotion Level;Transfers;Stairs;Stand    Examination-Participation Restrictions Community Activity;Yard Work    [employer] officer Evolving/Moderate complexity    Clinical Decision Making Moderate    Rehab Potential Good    PT Frequency 2x / week   plus eval   PT Duration 8 weeks    PT Treatment/Interventions ADLs/Self Care Home Management;DME Instruction;Gait training;Stair training;Functional mobility training;Therapeutic activities;Therapeutic exercise;Balance training;Neuromuscular re-education;Manual techniques;Vestibular;Canalith Repostioning    PT Next Visit Plan Did pt hear anything back on getting CT as was told that he had reoccurrence in bloodwork of his cancer?Collect ABC scale that I sent home with him and add goal. Begin balance training with working on static balance, eyes closed activities, head turns. Assess gait on nonlevel surfaces. [employer] he went through extensive vestibular testing at audiologist and they only found slight abnormality on the rotary chair test.    Consulted and Agree with Plan of Care Patient           Patient will benefit from skilled therapeutic intervention in order to improve the following deficits and impairments:  Abnormal  gait,Decreased balance,Decreased mobility,Decreased strength  Visit Diagnosis: Other abnormalities of gait and mobility  Muscle weakness (generalized)  Unsteadiness on feet     Problem List Patient Active Problem List   Diagnosis Date Noted  . Prostate cancer ([place]) 11/16/2018  . Malignant neoplasm of prostate ([place]) 09/18/2018  . Daytime somnolence 08/04/2017  . Dyspnea on exertion 08/04/2017  . Exercise intolerance 08/04/2017  . Need for influenza vaccination 07/01/2017  . Ulnar neuropathy at elbow, 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  . Essential hypertension 01/12/2017  . Groin strain 11/18/2016  . Irregular bowel habits 11/18/2016  . Rheumatoid arthritis ([place]) 01/22/2016  . Chronic insomnia 12/31/2015  . Chronic pain 12/31/2015  . Esophageal reflux 12/31/2015  . Hypercholesteremia 12/31/2015  . Chronic arthritis 12/31/2015  . Wrist pain, right 12/01/2015  . IT band syndrome 05/06/2014  . Cervical radiculopathy 07/14/2011  . Arthritis pain, hand 06/02/2011  . Osteoarthritis of [place] joint of thumb 06/02/2011    Electa Sniff, PT, DPT, NCS 03/11/2021, 4:03 PM  [facility] [address] [place], [place], [address] Phone: [phone]   Fax:  [phone]  Name: CORDEL DREWES MRN: [id number] Date of Birth: Jan 23, 1951

## 2021-03-16 ENCOUNTER — Other Ambulatory Visit: Payer: Self-pay

## 2021-03-19 ENCOUNTER — Other Ambulatory Visit: Payer: Self-pay | Admitting: Sports Medicine

## 2021-03-19 ENCOUNTER — Other Ambulatory Visit: Payer: Self-pay

## 2021-03-19 ENCOUNTER — Ambulatory Visit: Payer: Medicare Other | Attending: Neurology

## 2021-03-19 VITALS — BP 121/75 | HR 92

## 2021-03-19 DIAGNOSIS — M6281 Muscle weakness (generalized): Secondary | ICD-10-CM | POA: Diagnosis present

## 2021-03-19 DIAGNOSIS — R2681 Unsteadiness on feet: Secondary | ICD-10-CM | POA: Diagnosis present

## 2021-03-19 DIAGNOSIS — R2689 Other abnormalities of gait and mobility: Secondary | ICD-10-CM | POA: Diagnosis present

## 2021-03-19 MED ORDER — HYDROCODONE-ACETAMINOPHEN 10-325 MG PO TABS: ORAL_TABLET | ORAL | 0 refills | Status: DC

## 2021-03-19 MED ORDER — TRAMADOL HCL 50 MG PO TABS: ORAL_TABLET | ORAL | 0 refills | Status: DC

## 2021-03-19 NOTE — Therapy (Signed)
[facility] [address] [place], [place], [address] Phone: [phone]   Fax:  [phone]  Physical Therapy Treatment  Patient Details  Name: Isaiah Price MRN: [id number] Date of Birth: 06-25-1951 Referring Provider (PT): Margette Fast   Encounter Date: 03/19/2021   PT End of Session - 03/19/21 1232    Visit Number 2    Number of Visits 17    Date for PT Re-Evaluation 06/09/21   60 day poc, 90 day cert   Authorization Type Medicare so 10th visit progress note    PT Start Time 1232    PT Stop Time 1314    PT Time Calculation (min) 42 min    Equipment Utilized During Treatment Gait belt    Activity Tolerance Patient tolerated treatment well    Behavior During Therapy [facility] for tasks assessed/performed           Past Medical History:  Diagnosis Date  . Achilles tendinitis, left leg   . At risk for sleep apnea    STOP--BANG SCORE= 5  (routed to pt's pcp in epic 11-13-2018)  . Chronic insomnia 12/31/2015  . Chronic neck pain   . Congenital pectus excavatum    per pt had pulmonary test ,  60% restrictive by states asymptomatic  . DDD (degenerative disc disease), cervical   . Epicondylitis, lateral, right   . GERD (gastroesophageal reflux disease)   . History of exercise intolerance    09-02-2017  ETT,  negative for ischemia  . History of pericarditis 1985  . HTN (hypertension)   . Hypercholesteremia 12/31/2015  . Iritis   . IT band syndrome 05/06/2014  . Lower urinary tract symptoms (LUTS)   . Neuropathy    bilateral arms with tingling and numbness at times due to cervical neck  . OA (osteoarthritis)   . Polyarthralgia   . Prostate cancer [facility])    urologist-  dr Alinda Money--- Stage T1c, Gleason 3+4  . Rheumatoid arthritis [facility])    rheumatologist-- dr Amil Amen--- seropositive,multiple sites  . Wears glasses     Past Surgical History:  Procedure Laterality Date  . ANTERIOR CERVICAL DECOMP/DISCECTOMY FUSION   1991   dr Sherwood Gambler   C5 -- 7  (per pt fusion failed)  . CARPAL TUNNEL RELEASE Right done at same time of [place] fusion  . CARPOMETACARPAL ([place]) FUSION OF THUMB Bilateral 2015 and 2014  approx.  Marland Kitchen CATARACT EXTRACTION W/ INTRAOCULAR LENS  IMPLANT, BILATERAL  2013  . LYMPHADENECTOMY Bilateral 11/16/2018   Procedure: LYMPHADENECTOMY, PELVIC;  Surgeon: Raynelle Bring, MD;  Location: [facility];  Service: Urology;  Laterality: Bilateral;  . ROBOT ASSISTED LAPAROSCOPIC RADICAL PROSTATECTOMY N/A 11/16/2018   Procedure: XI ROBOTIC ASSISTED LAPAROSCOPIC RADICAL PROSTATECTOMY LEVEL 2;  Surgeon: Raynelle Bring, MD;  Location: [facility];  Service: Urology;  Laterality: N/A;  . SHOULDER SURGERY Left age 16  . TONSILLECTOMY  child    Vitals:   03/19/21 1243  BP: 121/75  Pulse: 92     Subjective Assessment - 03/19/21 1234    Subjective Patient reports that has been doing well, but the last few days have been off balance. Feels swimmy headed today in seated position. No falls.    Pertinent History PMH: rheumatoid arthritis, chronic neck pain, HTN, neuropathy in arms from neck, C5-7 ACDF, pectus excavatum which does contribute some to SOB and reports his FEV1 was decreased on testing, prostate cancer with history of surgery but just found reoccurance on bloodwork last week. Waiting for  CT currently.    Patient Stated Goals Pt wants to not feel lightheaded and feel steadier on feet.    Currently in Pain? Yes    Pain Score 5     Pain Location Neck    Pain Orientation Left    Pain Descriptors / Indicators Aching    Pain Type Chronic pain    Pain Radiating Towards base of skull    Pain Onset More than a month ago              [facility] Adult PT Treatment/Exercise - 03/19/21 0001      Transfers   Transfers Sit to Stand;Stand to Sit    Sit to Stand 5: Supervision    Stand to Sit 5: Supervision      Ambulation/Gait   Ambulation/Gait Yes    Ambulation/Gait Assistance 5: Supervision    Assistive device None     Gait Pattern Step-through pattern;Narrow base of support    Ambulation Surface Level;Indoor            Balance Exercises - 03/19/21 0001      Balance Exercises: Standing   Standing Eyes Opened Wide (BOA);Head turns;Limitations    Standing Eyes Opened Limitations completed horizontal/vertical head turns x 10 reps    Standing Eyes Closed Wide (BOA);Foam/compliant surface;3 reps;30 secs;Limitations    Standing Eyes Closed Limitations 3 x 30 seconds iwth eyes closed; increased use of hip strategy    Tandem Stance Eyes open;3 reps;30 secs;Limitations    Tandem Stance Time on firm surface; 1/2 tandem 2 x 30 seconds. trialed full tandem significant balance challenge noted    Rockerboard Anterior/posterior;EO;Limitations;Intermittent UE support    Rockerboard Limitations completed standing with board A/P completed weight shift x 10 reps, intermittent touch A. cues for upright posture.    Tandem Gait Forward;Intermittent upper extremity support;3 reps;Limitations    Tandem Gait Limitations completed x 4 laps down and back, intemrittent light touch to // bars          Established the following HEP during today's session:    Access Code: [email or web address] URL: [email or web address] Date: 03/19/2021 Prepared by: Baldomero Lamy  Exercises Standing Balance with Eyes Closed on Foam - 1 x daily - 5 x weekly - 1 sets - 3 reps - 30 seconds hold Standing with Head Rotation on Pillow - 1 x daily - 5 x weekly - 2 sets - 10 reps Standing Romberg to 1/2 Tandem Stance - 1 x daily - 5 x weekly - 1 sets - 3 reps - 30 seconds hold Tandem Walking with Counter Support - 1 x daily - 5 x weekly - 1 sets - 3 reps    PT Education - 03/19/21 1308    Education Details Initial Balance HEP    Person(s) Educated Patient    Methods Explanation;Demonstration;Handout    Comprehension Verbalized understanding;Returned demonstration            PT Short Term Goals - 03/11/21 1556      PT SHORT TERM GOAL  #1   Title Pt will be independent with balance HEP to continue gains at home.    Time 4    Period Weeks    Status New    Target Date 04/10/21      PT SHORT TERM GOAL #2   Title Pt will ambulate >500' on level surfaces independently with no significant staggering noted.    Time 4    Period Weeks    Status New    Target Date  04/10/21      PT SHORT TERM GOAL #3   Title Pt will increase FGA from 19 to >23/30 for improved balance and gait safety.    Baseline 03/11/21 19/30    Time 4    Period Weeks    Status New    Target Date 04/10/21             PT Long Term Goals - 03/11/21 1558      PT LONG TERM GOAL #1   Title Pt will ambulate up/down 8 steps with reciprocal pattern without rails independently for improved community access.    Time 8    Period Weeks    Status New    Target Date 05/10/21      PT LONG TERM GOAL #2   Title Pt will decrease 5 x sit to stand from 18.53 sec to <15 sec for improved balance and functional strength.    Baseline 03/11/21 18.53 sec from chair without hands    Time 8    Period Weeks    Status New    Target Date 05/10/21      PT LONG TERM GOAL #3   Title Pt will ambulate >1000' on varied outdoor surfaces mod I with LRAD versus no device for improved community mobility.    Time 8    Period Weeks    Status New    Target Date 05/10/21      PT LONG TERM GOAL #4   Title Pt will increase FGA to >26/30 for improved balance and gait safety.    Baseline 03/11/21 19/30    Time 8    Period Weeks    Status New    Target Date 05/10/21      PT LONG TERM GOAL #5   Title Pt will be able to perform simulated gardening tasks with bending over without LOB for improved function.    Time 8    Period Weeks    Status New    Target Date 05/10/21                 Plan - 03/19/21 1340    Clinical Impression Statement Today's skilled PT session focused on initiating corner balance activities and establishing HEP. Patient tolerating well, increased  challenge ntoed with tandem and vision removed today. Increased use of hip > ankle strategy noted. Will continue to progress toawrd all LTGs.    Personal Factors and Comorbidities Comorbidity 3+    Comorbidities rheumatoid arthritis, chronic neck pain, HTN, neuropathy in arms from neck, C5-7 ACDF    Examination-Activity Limitations Locomotion Level;Transfers;Stairs;Stand    Examination-Participation Restrictions Community Activity;Yard Work    [employer] officer Evolving/Moderate complexity    Rehab Potential Good    PT Frequency 2x / week   plus eval   PT Duration 8 weeks    PT Treatment/Interventions ADLs/Self Care Home Management;DME Instruction;Gait training;Stair training;Functional mobility training;Therapeutic activities;Therapeutic exercise;Balance training;Neuromuscular re-education;Manual techniques;Vestibular;Canalith Repostioning    PT Next Visit Plan Did pt hear anything back on getting CT as was told that he had reoccurrence in bloodwork of his cancer? Begin balance training with working on static balance, eyes closed activities, head turns. Assess gait on nonlevel surfaces.    Consulted and Agree with Plan of Care Patient           Patient will benefit from skilled therapeutic intervention in order to improve the following deficits and impairments:  Abnormal gait,Decreased balance,Decreased mobility,Decreased strength  Visit Diagnosis: Other abnormalities of gait  and mobility  Muscle weakness (generalized)  Unsteadiness on feet     Problem List Patient Active Problem List   Diagnosis Date Noted  . Prostate cancer ([place]) 11/16/2018  . Malignant neoplasm of prostate ([place]) 09/18/2018  . Daytime somnolence 08/04/2017  . Dyspnea on exertion 08/04/2017  . Exercise intolerance 08/04/2017  . Need for influenza vaccination 07/01/2017  . Ulnar neuropathy at elbow, 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  . Essential hypertension 01/12/2017  . Groin strain 11/18/2016  . Irregular  bowel habits 11/18/2016  . Rheumatoid arthritis ([place]) 01/22/2016  . Chronic insomnia 12/31/2015  . Chronic pain 12/31/2015  . Esophageal reflux 12/31/2015  . Hypercholesteremia 12/31/2015  . Chronic arthritis 12/31/2015  . Wrist pain, right 12/01/2015  . IT band syndrome 05/06/2014  . Cervical radiculopathy 07/14/2011  . Arthritis pain, hand 06/02/2011  . Osteoarthritis of [place] joint of thumb 06/02/2011    Isaiah Price, PT, DPT 03/19/2021, 1:43 PM  [facility] [address] [place], [place], [address] Phone: [phone]   Fax:  [phone]  Name: Isaiah Price MRN: [id number] Date of Birth: August 20, 1951

## 2021-03-19 NOTE — Patient Instructions (Signed)
Access Code: [email or web address] URL: [email or web address] Date: 03/19/2021 Prepared by: Baldomero Lamy  Exercises Standing Balance with Eyes Closed on Foam - 1 x daily - 5 x weekly - 1 sets - 3 reps - 30 seconds hold Standing with Head Rotation on Pillow - 1 x daily - 5 x weekly - 2 sets - 10 reps Standing Romberg to 1/2 Tandem Stance - 1 x daily - 5 x weekly - 1 sets - 3 reps - 30 seconds hold Tandem Walking with Counter Support - 1 x daily - 5 x weekly - 1 sets - 3 reps

## 2021-03-20 ENCOUNTER — Ambulatory Visit: Payer: Medicare Other

## 2021-03-20 DIAGNOSIS — M6281 Muscle weakness (generalized): Secondary | ICD-10-CM

## 2021-03-20 DIAGNOSIS — R2689 Other abnormalities of gait and mobility: Secondary | ICD-10-CM

## 2021-03-20 DIAGNOSIS — R2681 Unsteadiness on feet: Secondary | ICD-10-CM

## 2021-03-20 NOTE — Therapy (Signed)
[facility] [address] [place], [place], [address] Phone: [phone]   Fax:  [phone]  Physical Therapy Treatment  Patient Details  Name: Isaiah Price MRN: [id number] Date of Birth: July 21, 1951 Referring Provider (PT): Margette Fast   Encounter Date: 03/20/2021   PT End of Session - 03/20/21 1108    Visit Number 3    Number of Visits 17    Date for PT Re-Evaluation 06/09/21   60 day poc, 90 day cert   Authorization Type Medicare so 10th visit progress note    PT Start Time 1106    PT Stop Time 1145    PT Time Calculation (min) 39 min    Equipment Utilized During Treatment Gait belt    Activity Tolerance Patient tolerated treatment well    Behavior During Therapy WFL for tasks assessed/performed           Past Medical History:  Diagnosis Date  . Achilles tendinitis, left leg   . At risk for sleep apnea    STOP--BANG SCORE= 5  (routed to pt's pcp in epic 11-13-2018)  . Chronic insomnia 12/31/2015  . Chronic neck pain   . Congenital pectus excavatum    per pt had pulmonary test ,  60% restrictive by states asymptomatic  . DDD (degenerative disc disease), cervical   . Epicondylitis, lateral, right   . GERD (gastroesophageal reflux disease)   . History of exercise intolerance    09-02-2017  ETT,  negative for ischemia  . History of pericarditis 1985  . HTN (hypertension)   . Hypercholesteremia 12/31/2015  . Iritis   . IT band syndrome 05/06/2014  . Lower urinary tract symptoms (LUTS)   . Neuropathy    bilateral arms with tingling and numbness at times due to cervical neck  . OA (osteoarthritis)   . Polyarthralgia   . Prostate cancer [facility])    urologist-  dr Alinda Money--- Stage T1c, Gleason 3+4  . Rheumatoid arthritis [facility])    rheumatologist-- dr Amil Amen--- seropositive,multiple sites  . Wears glasses     Past Surgical History:  Procedure Laterality Date  . ANTERIOR CERVICAL DECOMP/DISCECTOMY FUSION   1991   dr Sherwood Gambler   C5 -- 7  (per pt fusion failed)  . CARPAL TUNNEL RELEASE Right done at same time of [place] fusion  . CARPOMETACARPAL ([place]) FUSION OF THUMB Bilateral 2015 and 2014  approx.  Marland Kitchen CATARACT EXTRACTION W/ INTRAOCULAR LENS  IMPLANT, BILATERAL  2013  . LYMPHADENECTOMY Bilateral 11/16/2018   Procedure: LYMPHADENECTOMY, PELVIC;  Surgeon: Raynelle Bring, MD;  [facility]: [facility];  Service: Urology;  Laterality: Bilateral;  . ROBOT ASSISTED LAPAROSCOPIC RADICAL PROSTATECTOMY N/A 11/16/2018   Procedure: XI ROBOTIC ASSISTED LAPAROSCOPIC RADICAL PROSTATECTOMY LEVEL 2;  Surgeon: Raynelle Bring, MD;  [facility];  Service: Urology;  Laterality: N/A;  . SHOULDER SURGERY Left age 63  . TONSILLECTOMY  child    There were no vitals filed for this visit.   Subjective Assessment - 03/20/21 1109    Subjective Patient reports feeling good this morning. Found a spot to do the exercises, but did not get to complete. No swimmy headed sensation today. Denies pain or falls.    Pertinent History PMH: rheumatoid arthritis, chronic neck pain, HTN, neuropathy in arms from neck, C5-7 ACDF, pectus excavatum which does contribute some to SOB and reports his FEV1 was decreased on testing, prostate cancer with history of surgery but just found reoccurance on bloodwork last week. Waiting for CT currently.  Patient Stated Goals Pt wants to not feel lightheaded and feel steadier on feet.    Currently in Pain? No/denies    Pain Onset More than a month ago              [facility] Adult PT Treatment/Exercise - 03/20/21 0001      Ambulation/Gait   Ambulation/Gait Yes    Ambulation/Gait Assistance 5: Supervision;4: Min guard    Ambulation/Gait Assistance Details completed ambulation outdoors on unlevel surfaces including pavement and grass x 400 ft. Patient demo mild unsteadiness on grass but able to maintain balance w/o assistance. Patient did have 2-3 instances of sudden spontaneous retropulsion with static  standing outdoors requiring CGA from PT.    Ambulation Distance (Feet) 400 Feet    Assistive device None    Gait Pattern Step-through pattern;Narrow base of support    Ambulation Surface Level;Indoor;Unlevel;Outdoor;Grass               Balance Exercises - 03/20/21 0001      Balance Exercises: Standing   Standing Eyes Closed Wide (BOA);Foam/compliant surface;3 reps;30 secs;Limitations    Standing Eyes Closed Limitations 3 x 30 seconds w/ eyes closed; increased postural sway noted and use of hip strategy noted.    Tandem Stance Eyes open;3 reps;30 secs;Limitations;Eyes closed    Tandem Stance Time firm surface; full tandem x 30 seconds bilaterally; then progressed to eyes closed 2 x 15 seconds on BLE. intermittent touchA with vision removed.    Stepping Strategy Anterior;Posterior;Foam/compliant surface;Limitations    Stepping Strategy Limitations completed without UE support on airex x 15 reps each direction, increased challenge with anterior/posterior.    Rockerboard Anterior/posterior;EO;Limitations;Intermittent UE support    Rockerboard Limitations completed standing with board A/P completed weight shift x 10 reps, intermittent touch A. cues for upright posture. then holding board steady completed horizontal/vertical head turns x 10 reps each direction.    Tandem Gait Forward;3 reps;Limitations    Tandem Gait Limitations x 3 laps down and back in // bars, intermittent touch A to bars               PT Short Term Goals - 03/11/21 1556      PT SHORT TERM GOAL #1   Title Pt will be independent with balance HEP to continue gains at home.    Time 4    Period Weeks    Status New    Target Date 04/10/21      PT SHORT TERM GOAL #2   Title Pt will ambulate >500' on level surfaces independently with no significant staggering noted.    Time 4    Period Weeks    Status New    Target Date 04/10/21      PT SHORT TERM GOAL #3   Title Pt will increase FGA from 19 to >23/30 for  improved balance and gait safety.    Baseline 03/11/21 19/30    Time 4    Period Weeks    Status New    Target Date 04/10/21             PT Long Term Goals - 03/11/21 1558      PT LONG TERM GOAL #1   Title Pt will ambulate up/down 8 steps with reciprocal pattern without rails independently for improved community access.    Time 8    Period Weeks    Status New    Target Date 05/10/21      PT LONG TERM GOAL #2   Title  Pt will decrease 5 x sit to stand from 18.53 sec to <15 sec for improved balance and functional strength.    Baseline 03/11/21 18.53 sec from chair without hands    Time 8    Period Weeks    Status New    Target Date 05/10/21      PT LONG TERM GOAL #3   Title Pt will ambulate >1000' on varied outdoor surfaces mod I with LRAD versus no device for improved community mobility.    Time 8    Period Weeks    Status New    Target Date 05/10/21      PT LONG TERM GOAL #4   Title Pt will increase FGA to >26/30 for improved balance and gait safety.    Baseline 03/11/21 19/30    Time 8    Period Weeks    Status New    Target Date 05/10/21      PT LONG TERM GOAL #5   Title Pt will be able to perform simulated gardening tasks with bending over without LOB for improved function.    Time 8    Period Weeks    Status New    Target Date 05/10/21                 Plan - 03/20/21 1238    Clinical Impression Statement Continued high level balance today during session, with patient tolerating continue to demo significant use of hip strategy at times. Ambulation outdoors supervision overall, patient did demo 2-3 instance of signigifant posterior LOB requiring CGA from PT. Will continue to progress toward all LTGs.    Personal Factors and Comorbidities Comorbidity 3+    Comorbidities rheumatoid arthritis, chronic neck pain, HTN, neuropathy in arms from neck, C5-7 ACDF    Examination-Activity Limitations Locomotion Level;Transfers;Stairs;Stand     Examination-Participation Restrictions Community Activity;Yard Work    [employer] officer Evolving/Moderate complexity    Rehab Potential Good    PT Frequency 2x / week   plus eval   PT Duration 8 weeks    PT Treatment/Interventions ADLs/Self Care Home Management;DME Instruction;Gait training;Stair training;Functional mobility training;Therapeutic activities;Therapeutic exercise;Balance training;Neuromuscular re-education;Manual techniques;Vestibular;Canalith Repostioning    PT Next Visit Plan continue balance training with working on static balance, eyes closed activities, head turns. dynamic gait activities    Consulted and Agree with Plan of Care Patient           Patient will benefit from skilled therapeutic intervention in order to improve the following deficits and impairments:  Abnormal gait,Decreased balance,Decreased mobility,Decreased strength  Visit Diagnosis: Other abnormalities of gait and mobility  Muscle weakness (generalized)  Unsteadiness on feet     Problem List Patient Active Problem List   Diagnosis Date Noted  . Prostate cancer ([place]) 11/16/2018  . Malignant neoplasm of prostate ([place]) 09/18/2018  . Daytime somnolence 08/04/2017  . Dyspnea on exertion 08/04/2017  . Exercise intolerance 08/04/2017  . Need for influenza vaccination 07/01/2017  . Ulnar neuropathy at elbow, 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  . Essential hypertension 01/12/2017  . Groin strain 11/18/2016  . Irregular bowel habits 11/18/2016  . Rheumatoid arthritis ([place]) 01/22/2016  . Chronic insomnia 12/31/2015  . Chronic pain 12/31/2015  . Esophageal reflux 12/31/2015  . Hypercholesteremia 12/31/2015  . Chronic arthritis 12/31/2015  . Wrist pain, right 12/01/2015  . IT band syndrome 05/06/2014  . Cervical radiculopathy 07/14/2011  . Arthritis pain, hand 06/02/2011  . Osteoarthritis of [place] joint of thumb 06/02/2011    Jones Bales, PT,  DPT 03/20/2021, 12:40 PM  [facility] [address] [place], [place], [address] Phone: [phone]   Fax:  [phone]  Name: Isaiah Price MRN: [id number] Date of Birth: Jul 12, 1951

## 2021-03-23 ENCOUNTER — Ambulatory Visit (HOSPITAL_COMMUNITY)
Admission: RE | Admit: 2021-03-23 | Discharge: 2021-03-23 | Disposition: A | Discharge status: Home / Self Care | Payer: Medicare Other | Source: Ambulatory Visit | Attending: Urology | Admitting: Urology

## 2021-03-23 ENCOUNTER — Other Ambulatory Visit: Payer: Self-pay

## 2021-03-23 DIAGNOSIS — C61 Malignant neoplasm of prostate: Secondary | ICD-10-CM | POA: Diagnosis present

## 2021-03-23 MED ORDER — PIFLIFOLASTAT F 18 (PYLARIFY) INJECTION
9.0000 | Freq: Once | INTRAVENOUS | Status: AC
  Administered 2021-03-23: 8 via INTRAVENOUS

## 2021-03-25 ENCOUNTER — Ambulatory Visit: Payer: Medicare Other

## 2021-03-25 ENCOUNTER — Other Ambulatory Visit: Payer: Self-pay

## 2021-03-25 DIAGNOSIS — R2689 Other abnormalities of gait and mobility: Secondary | ICD-10-CM

## 2021-03-25 DIAGNOSIS — M6281 Muscle weakness (generalized): Secondary | ICD-10-CM

## 2021-03-25 DIAGNOSIS — R2681 Unsteadiness on feet: Secondary | ICD-10-CM

## 2021-03-25 NOTE — Therapy (Signed)
[facility] [address] [place], [place], [address] Phone: [phone]   Fax:  [phone]  Physical Therapy Treatment  Patient Details  Name: Isaiah Price MRN: [id number] Date of Birth: Jul 31, 1951 Referring Provider (PT): Margette Fast   Encounter Date: 03/25/2021   PT End of Session - 03/25/21 1407    Visit Number 4    Number of Visits 17    Date for PT Re-Evaluation 06/09/21   60 day poc, 90 day cert   Authorization Type Medicare so 10th visit progress note    PT Start Time 1405    PT Stop Time 1445    PT Time Calculation (min) 40 min    Equipment Utilized During Treatment Gait belt    Activity Tolerance Patient tolerated treatment well    Behavior During Therapy [facility] for tasks assessed/performed           Past Medical History:  Diagnosis Date  . Achilles tendinitis, left leg   . At risk for sleep apnea    STOP--BANG SCORE= 5  (routed to pt's pcp in epic 11-13-2018)  . Chronic insomnia 12/31/2015  . Chronic neck pain   . Congenital pectus excavatum    per pt had pulmonary test ,  60% restrictive by states asymptomatic  . DDD (degenerative disc disease), cervical   . Epicondylitis, lateral, right   . GERD (gastroesophageal reflux disease)   . History of exercise intolerance    09-02-2017  ETT,  negative for ischemia  . History of pericarditis 1985  . HTN (hypertension)   . Hypercholesteremia 12/31/2015  . Iritis   . IT band syndrome 05/06/2014  . Lower urinary tract symptoms (LUTS)   . Neuropathy    bilateral arms with tingling and numbness at times due to cervical neck  . OA (osteoarthritis)   . Polyarthralgia   . Prostate cancer [facility])    urologist-  dr Alinda Money--- Stage T1c, Gleason 3+4  . Rheumatoid arthritis [facility])    rheumatologist-- dr Amil Amen--- seropositive,multiple sites  . Wears glasses     Past Surgical History:  Procedure Laterality Date  . ANTERIOR CERVICAL DECOMP/DISCECTOMY FUSION   1991   dr Sherwood Gambler   C5 -- 7  (per pt fusion failed)  . CARPAL TUNNEL RELEASE Right done at same time of [place] fusion  . CARPOMETACARPAL ([place]) FUSION OF THUMB Bilateral 2015 and 2014  approx.  Marland Kitchen CATARACT EXTRACTION W/ INTRAOCULAR LENS  IMPLANT, BILATERAL  2013  . LYMPHADENECTOMY Bilateral 11/16/2018   Procedure: LYMPHADENECTOMY, PELVIC;  Surgeon: Raynelle Bring, MD;  Location: [facility];  Service: Urology;  Laterality: Bilateral;  . ROBOT ASSISTED LAPAROSCOPIC RADICAL PROSTATECTOMY N/A 11/16/2018   Procedure: XI ROBOTIC ASSISTED LAPAROSCOPIC RADICAL PROSTATECTOMY LEVEL 2;  Surgeon: Raynelle Bring, MD;  Location: [facility];  Service: Urology;  Laterality: N/A;  . SHOULDER SURGERY Left age 41  . TONSILLECTOMY  child    There were no vitals filed for this visit.      Pt educated on 3 balance systems: Visual, vestibular, and proprioception Standing on floor: EO: 30 sec, EC: 30 sec Standing on Foam: EO: 30 sec, EC: 30 sec Standing on foam: wide BOS: horizontal and vertical head turns: 10x Cervical evaluation: Sustained overpressure for 5-10 seconds provokes patient's light headness symptoms with L rotation and cervical flexion Grade I-II upper cervical mobilization Movement with mobilization with strap: with cervical rotation: 10x R and L  PT Short Term Goals - 03/11/21 1556      PT SHORT TERM GOAL #1   Title Pt will be independent with balance HEP to continue gains at home.    Time 4    Period Weeks    Status New    Target Date 04/10/21      PT SHORT TERM GOAL #2   Title Pt will ambulate >500' on level surfaces independently with no significant staggering noted.    Time 4    Period Weeks    Status New    Target Date 04/10/21      PT SHORT TERM GOAL #3   Title Pt will increase FGA from 19 to >23/30 for improved balance and gait safety.    Baseline 03/11/21 19/30    Time 4    Period Weeks    Status New    Target Date 04/10/21              PT Long Term Goals - 03/11/21 1558      PT LONG TERM GOAL #1   Title Pt will ambulate up/down 8 steps with reciprocal pattern without rails independently for improved community access.    Time 8    Period Weeks    Status New    Target Date 05/10/21      PT LONG TERM GOAL #2   Title Pt will decrease 5 x sit to stand from 18.53 sec to <15 sec for improved balance and functional strength.    Baseline 03/11/21 18.53 sec from chair without hands    Time 8    Period Weeks    Status New    Target Date 05/10/21      PT LONG TERM GOAL #3   Title Pt will ambulate >1000' on varied outdoor surfaces mod I with LRAD versus no device for improved community mobility.    Time 8    Period Weeks    Status New    Target Date 05/10/21      PT LONG TERM GOAL #4   Title Pt will increase FGA to >26/30 for improved balance and gait safety.    Baseline 03/11/21 19/30    Time 8    Period Weeks    Status New    Target Date 05/10/21      PT LONG TERM GOAL #5   Title Pt will be able to perform simulated gardening tasks with bending over without LOB for improved function.    Time 8    Period Weeks    Status New    Target Date 05/10/21                 Plan - 03/25/21 1447    Clinical Impression Statement We were able to provike patient lightheadeness with cervical overpressure with L cervical rotation and cervical flexion (extension not tested). Pt's dizziness symptoms may have cervical component and will benefit from manual therapy and exercise to improe cervical mobility. Pt reported decreased in headache that he had for past couple of days after manual therapy and exercises.    Personal Factors and Comorbidities Comorbidity 3+    Comorbidities rheumatoid arthritis, chronic neck pain, HTN, neuropathy in arms from neck, C5-7 ACDF    Examination-Activity Limitations Locomotion Level;Transfers;Stairs;Stand    Examination-Participation Restrictions Community Activity;Yard Work     Stability/Clinical Decision Making Evolving/Moderate complexity    Rehab Potential Good    PT Frequency 2x / week   plus eval   PT Duration  8 weeks    PT Treatment/Interventions ADLs/Self Care Home Management;DME Instruction;Gait training;Stair training;Functional mobility training;Therapeutic activities;Therapeutic exercise;Balance training;Neuromuscular re-education;Manual techniques;Vestibular;Canalith Repostioning    PT Next Visit Plan continue balance training with working on static balance, eyes closed activities, head turns. dynamic gait activities    PT Home Exercise Plan Access Code [email or web address]    [email or web address] and Agree with Plan of Care Patient           Patient will benefit from skilled therapeutic intervention in order to improve the following deficits and impairments:  Abnormal gait,Decreased balance,Decreased mobility,Decreased strength  Visit Diagnosis: Other abnormalities of gait and mobility  Muscle weakness (generalized)  Unsteadiness on feet     Problem List Patient Active Problem List   Diagnosis Date Noted  . Prostate cancer ([place]) 11/16/2018  . Malignant neoplasm of prostate ([place]) 09/18/2018  . Daytime somnolence 08/04/2017  . Dyspnea on exertion 08/04/2017  . Exercise intolerance 08/04/2017  . Need for influenza vaccination 07/01/2017  . Ulnar neuropathy at elbow, 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  . Essential hypertension 01/12/2017  . Groin strain 11/18/2016  . Irregular bowel habits 11/18/2016  . Rheumatoid arthritis ([place]) 01/22/2016  . Chronic insomnia 12/31/2015  . Chronic pain 12/31/2015  . Esophageal reflux 12/31/2015  . Hypercholesteremia 12/31/2015  . Chronic arthritis 12/31/2015  . Wrist pain, right 12/01/2015  . IT band syndrome 05/06/2014  . Cervical radiculopathy 07/14/2011  . Arthritis pain, hand 06/02/2011  . Osteoarthritis of [place] joint of thumb 06/02/2011    Kerrie Pleasure, PT 03/25/2021, 2:49 PM  [facility] [address] [place], [place], [address] Phone: [phone]   Fax:  [phone]  Name: Isaiah Price MRN: [id number] Date of Birth: Jun 02, 1951

## 2021-03-26 ENCOUNTER — Telehealth: Payer: Self-pay | Admitting: Radiation Oncology

## 2021-03-26 NOTE — Telephone Encounter (Signed)
Mr. Harriger LVM to r/s his 7/1 RECON w/ Freeman Caldron, PA. Returned his call but he did not answer. LVM for a return call.

## 2021-03-27 ENCOUNTER — Ambulatory Visit: Payer: Medicare Other

## 2021-03-31 ENCOUNTER — Ambulatory Visit (INDEPENDENT_AMBULATORY_CARE_PROVIDER_SITE_OTHER): Payer: Medicare Other | Admitting: Sports Medicine

## 2021-03-31 ENCOUNTER — Other Ambulatory Visit: Payer: Self-pay

## 2021-03-31 VITALS — BP 146/86 | Ht 75.0 in | Wt 200.0 lb

## 2021-03-31 DIAGNOSIS — M25521 Pain in right elbow: Secondary | ICD-10-CM | POA: Diagnosis present

## 2021-03-31 NOTE — Progress Notes (Signed)
  KAYDE WAREHIME - 70 y.o. male MRN [id number]  Date of birth: 1951-01-17  SUBJECTIVE:    CC: Right Elbow Pain  [name] is a right hand dominant male who presents with pain at his right elbow after an acute injury yesterday. He was lifting a propane tank and felt a pop in his arm. He had substantial pain and has been unable to use the elbow much since. The pain is located at the anterior aspect of the elbow. He also has pain along the radial aspect of his forearm. There has been no swelling of the elbow. He has not hurt that elbow before. He has torn the distal biceps tendon in his left elbow before. Of note, he does have Rheumatoid arthritis. He was unable to sleep 2/2 pain. He took some hydrocodone, tramadol, and flexeril with minimum relief. His pain is mainly with flexion and turning of his forearm. He has some nubness/tingling in the palmar aspect of his pinky finger.   Of note, [name] also reports having recently been diagnosed with a recurrence of his Prostate cancer.    Objective:  VS: BP:(!) 146/86  HR: bpm  TEMP: ( )  RESP:   HT:6\' 3"  (190.5 cm)   WT:200 lb (90.7 kg)  BMI:25 PHYSICAL EXAM:  Well appearing male. Holding his right arm against his chest.   Right Elbow: No effusion or ecchymosis. No obvious deformities TTP at the anterior aspect along the distal biceps tendon.  Difficulty with ROM in flexion 2/2 pain .  Pain with supination.  5/5 strength and FROM in extension.  Valgus and varus stress testing without abnormality.   ASSESSMENT & PLAN:  Right Elbow Pain: His story and symptoms raise suspicion for a distal biceps tear. His hx of rheumatoid arthritis and left partial biceps tendon further raise this suspicion. We will go ahead and get an MRI to evaluate. We will call him with the results. He can wear a sling in the meantime. He can continue to use his already prescribed medications for pain control. He should continue to ROM the elbow to the best of his ability to avoid  stiffness. We will base follow up off of his MRI results.   Marcelino Duster, MS4   Patient seen and evaluated with the medical student.  I agree with the above plan of care.  Patient's mechanism of injury and pain with supination are concerning for a distal biceps tendon rupture.  MRI of the right elbow to evaluate further.  Phone follow-up with those results when available.  In the meantime, he will use a sling for comfort only.

## 2021-04-01 ENCOUNTER — Encounter: Payer: Self-pay | Admitting: Sports Medicine

## 2021-04-01 ENCOUNTER — Ambulatory Visit: Payer: Medicare Other

## 2021-04-01 DIAGNOSIS — R2681 Unsteadiness on feet: Secondary | ICD-10-CM

## 2021-04-01 DIAGNOSIS — M6281 Muscle weakness (generalized): Secondary | ICD-10-CM

## 2021-04-01 DIAGNOSIS — R2689 Other abnormalities of gait and mobility: Secondary | ICD-10-CM | POA: Diagnosis not present

## 2021-04-01 NOTE — Therapy (Signed)
[facility] [address] [place], [place], [address] Phone: [phone]   Fax:  [phone]  Physical Therapy Treatment  Patient Details  Name: Isaiah Price MRN: [id number] Date of Birth: 09-Nov-1950 Referring Provider (PT): Margette Fast   Encounter Date: 04/01/2021   PT End of Session - 04/01/21 1429     Visit Number 5    Number of Visits 17    Date for PT Re-Evaluation 06/09/21   60 day poc, 90 day cert   Authorization Type Medicare so 10th visit progress note    PT Start Time 1400    PT Stop Time 1445    PT Time Calculation (min) 45 min    Equipment Utilized During Treatment Gait belt    Activity Tolerance Patient tolerated treatment well    Behavior During Therapy [facility] for tasks assessed/performed             Past Medical History:  Diagnosis Date   Achilles tendinitis, left leg    At risk for sleep apnea    STOP--BANG SCORE= 5  (routed to pt's pcp in epic 11-13-2018)   Chronic insomnia 12/31/2015   Chronic neck pain    Congenital pectus excavatum    per pt had pulmonary test ,  60% restrictive by states asymptomatic   DDD (degenerative disc disease), cervical    Epicondylitis, lateral, right    GERD (gastroesophageal reflux disease)    History of exercise intolerance    09-02-2017  ETT,  negative for ischemia   History of pericarditis 1985   HTN (hypertension)    Hypercholesteremia 12/31/2015   Iritis    IT band syndrome 05/06/2014   Lower urinary tract symptoms (LUTS)    Neuropathy    bilateral arms with tingling and numbness at times due to cervical neck   OA (osteoarthritis)    Polyarthralgia    Prostate cancer [facility])    urologist-  dr Alinda Money--- Stage T1c, Gleason 3+4   Rheumatoid arthritis [facility])    rheumatologist-- dr Amil Amen--- seropositive,multiple sites   Wears glasses     Past Surgical History:  Procedure Laterality Date   ANTERIOR CERVICAL DECOMP/DISCECTOMY FUSION  18   dr  Sherwood Gambler   C5 -- 7  (per pt fusion failed)   CARPAL TUNNEL RELEASE Right done at same time of [place] fusion   CARPOMETACARPAL ([place]) FUSION OF THUMB Bilateral 2015 and 2014  approx.   CATARACT EXTRACTION W/ INTRAOCULAR LENS  IMPLANT, BILATERAL  2013   LYMPHADENECTOMY Bilateral 11/16/2018   Procedure: LYMPHADENECTOMY, PELVIC;  Surgeon: Raynelle Bring, MD;  [facility];  Service: Urology;  Laterality: Bilateral;   ROBOT ASSISTED LAPAROSCOPIC RADICAL PROSTATECTOMY N/A 11/16/2018   Procedure: XI ROBOTIC ASSISTED LAPAROSCOPIC RADICAL PROSTATECTOMY LEVEL 2;  Surgeon: Raynelle Bring, MD;  Location[facility];  Service: Urology;  Laterality: N/A;   SHOULDER SURGERY Left age 83   TONSILLECTOMY  child    There were no vitals filed for this visit.   Subjective Assessment - 04/01/21 1429     Subjective My headaches are better. I felt lightheadedness even when sitting 2-3 times ince last time.    Pertinent History PMH: rheumatoid arthritis, chronic neck pain, HTN, neuropathy in arms from neck, C5-7 ACDF, pectus excavatum which does contribute some to SOB and reports his FEV1 was decreased on testing, prostate cancer with history of surgery but just found reoccurance on bloodwork last week. Waiting for CT currently.    Patient Stated Goals Pt wants to  not feel lightheaded and feel steadier on feet.    Currently in Pain? No/denies    Pain Onset More than a month ago                   Soft tissue mobilization to bil suboccipital area Grade IV AP mobilization of OA joint Grade III movement with mobilization with unilateral PA on R OA joint with left cervical rotation Cervical AROM: flexion, extension, rotation: 10x Movement with mobilization with strap: cervical rotation: 10x R and L Seated thoracic twists: 10x R and L Gait training: 1 x 210' with horizontal and vertical head turns                      PT Short Term Goals - 03/11/21 1556       PT SHORT TERM GOAL #1    Title Pt will be independent with balance HEP to continue gains at home.    Time 4    Period Weeks    Status New    Target Date 04/10/21      PT SHORT TERM GOAL #2   Title Pt will ambulate >500' on level surfaces independently with no significant staggering noted.    Time 4    Period Weeks    Status New    Target Date 04/10/21      PT SHORT TERM GOAL #3   Title Pt will increase FGA from 19 to >23/30 for improved balance and gait safety.    Baseline 03/11/21 19/30    Time 4    Period Weeks    Status New    Target Date 04/10/21               PT Long Term Goals - 03/11/21 1558       PT LONG TERM GOAL #1   Title Pt will ambulate up/down 8 steps with reciprocal pattern without rails independently for improved community access.    Time 8    Period Weeks    Status New    Target Date 05/10/21      PT LONG TERM GOAL #2   Title Pt will decrease 5 x sit to stand from 18.53 sec to <15 sec for improved balance and functional strength.    Baseline 03/11/21 18.53 sec from chair without hands    Time 8    Period Weeks    Status New    Target Date 05/10/21      PT LONG TERM GOAL #3   Title Pt will ambulate >1000' on varied outdoor surfaces mod I with LRAD versus no device for improved community mobility.    Time 8    Period Weeks    Status New    Target Date 05/10/21      PT LONG TERM GOAL #4   Title Pt will increase FGA to >26/30 for improved balance and gait safety.    Baseline 03/11/21 19/30    Time 8    Period Weeks    Status New    Target Date 05/10/21      PT LONG TERM GOAL #5   Title Pt will be able to perform simulated gardening tasks with bending over without LOB for improved function.    Time 8    Period Weeks    Status New    Target Date 05/10/21                   Plan - 04/01/21 1429  Clinical Impression Statement Pt reported provocation of lightheadness with soft tissue work to suboccipital region and with fascial stretching on L>R side of  the neck. After manual therapy, pt demonstrated improved cervical ROM overall and reported no provocation of lightheadness with end range stretching of cervical spine.    Personal Factors and Comorbidities Comorbidity 3+    Comorbidities rheumatoid arthritis, chronic neck pain, HTN, neuropathy in arms from neck, C5-7 ACDF    Examination-Activity Limitations Locomotion Level;Transfers;Stairs;Stand    Examination-Participation Restrictions Community Activity;Yard Work    [employer] Evolving/Moderate complexity    Rehab Potential Good    PT Frequency 2x / week   plus eval   PT Duration 8 weeks    PT Treatment/Interventions ADLs/Self Care Home Management;DME Instruction;Gait training;Stair training;Functional mobility training;Therapeutic activities;Therapeutic exercise;Balance training;Neuromuscular re-education;Manual techniques;Vestibular;Canalith Repostioning    PT Next Visit Plan continue balance training with working on static balance, eyes closed activities, head turns. dynamic gait activities    PT Home Exercise Plan Access Code [email or web address]    [email or web address] and Agree with Plan of Care Patient             Patient will benefit from skilled therapeutic intervention in order to improve the following deficits and impairments:  Abnormal gait, Decreased balance, Decreased mobility, Decreased strength  Visit Diagnosis: Other abnormalities of gait and mobility  Muscle weakness (generalized)  Unsteadiness on feet     Problem List Patient Active Problem List   Diagnosis Date Noted   Prostate cancer ([place]) 11/16/2018   Malignant neoplasm of prostate ([place]) 09/18/2018   Daytime somnolence 08/04/2017   Dyspnea on exertion 08/04/2017   Exercise intolerance 08/04/2017   Need for influenza vaccination 07/01/2017   Ulnar neuropathy at elbow, 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   Essential hypertension 01/12/2017   Groin strain 11/18/2016   Irregular bowel habits 11/18/2016   Rheumatoid  arthritis ([place]) 01/22/2016   Chronic insomnia 12/31/2015   Chronic pain 12/31/2015   Esophageal reflux 12/31/2015   Hypercholesteremia 12/31/2015   Chronic arthritis 12/31/2015   Wrist pain, right 12/01/2015   IT band syndrome 05/06/2014   Cervical radiculopathy 07/14/2011   Arthritis pain, hand 06/02/2011   Osteoarthritis of [place] joint of thumb 06/02/2011    Kerrie Pleasure, PT 04/01/2021, 2:33 PM  [facility] [address] [place], [place], [address] Phone: [phone]   Fax:  [phone]  Name: Isaiah Price MRN: [id number] Date of Birth: 03-22-1951

## 2021-04-02 ENCOUNTER — Ambulatory Visit
Admission: RE | Admit: 2021-04-02 | Discharge: 2021-04-02 | Disposition: A | Discharge status: Home / Self Care | Payer: Medicare Other | Source: Ambulatory Visit | Attending: Radiation Oncology | Admitting: Radiation Oncology

## 2021-04-02 ENCOUNTER — Other Ambulatory Visit: Payer: Self-pay

## 2021-04-02 ENCOUNTER — Encounter: Payer: Self-pay | Admitting: Radiation Oncology

## 2021-04-02 ENCOUNTER — Ambulatory Visit
Admission: RE | Admit: 2021-04-02 | Discharge: 2021-04-02 | Disposition: A | Discharge status: Home / Self Care | Payer: Medicare Other | Source: Ambulatory Visit | Attending: Urology | Admitting: Urology

## 2021-04-02 VITALS — BP 146/93 | HR 90 | Temp 97.8°F | Resp 20 | Wt 194.6 lb

## 2021-04-02 DIAGNOSIS — C61 Malignant neoplasm of prostate: Secondary | ICD-10-CM

## 2021-04-02 NOTE — Progress Notes (Signed)
BP (!) 146/93   Pulse 90   Temp 97.8 F (36.6 C)   Resp 20   Wt 194 lb 9.6 oz (88.3 kg)   SpO2 98%   BMI 24.32 kg/m  AUA 24 pain 3/10 in neck and right bicep tendon.Patient is on Methotrexate 5 mg weekly. This is for rheumatoid arthritis since last fall. He is also on a biologic has been told by rheumatologist that it will have to stop, he does not want to stop this medication.

## 2021-04-02 NOTE — Progress Notes (Signed)
2 Radiation Oncology         [phone] ________________________________  Outpatient Re-Consultation  Name: Isaiah Price MRN: [id number]  Date: 04/02/2021  DOB: 05/13/1951  CC:Isaiah Sacramento, MD  Isaiah Bring, MD   REFERRING PHYSICIAN: Raynelle Bring, MD  DIAGNOSIS: 70 y.o. 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    ICD-10-CM   1. Malignant neoplasm of prostate ([place])  C61       HISTORY OF PRESENT ILLNESS: Isaiah Price is a 70 y.o. male with a diagnosis of prostate cancer. He is a well-established urology patient, previously followed by Dr. Jeffie Price, for an elevated PSA and lower urinary symptoms of outflow obstruction since 2012. 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 He was noted to have a steadily rising PSA, up to 5.73 on 07/03/18 and therefore proceeded to repeat transrectal ultrasound with 12 biopsies of the prostate on 08/09/18. Out of 12 core biopsies, 2 were positive with a maximum Gleason score of 3+4, and this was seen in left mid lateral.   We initially met him in consultation on 09/18/2018 to discuss treatment options and at that time, he opted to proceed with surgery.  He underwent a RALP with BPLND on 11/16/2018 under the care and direction of Dr. Alinda Price.  Final surgical pathology revealed pT3aN0, Gleason 4+4 adenocarcinoma of the prostate with focal extraprostatic extension identified at the left anterolateral mid but margins negative.  There was no seminal vesicle involvement and all biopsied lymph nodes were negative (0/9).  His initial postoperative PSA performed on 02/21/2019 was low detectable at 0.077 and had continued to slowly rise-- at 0.117 on 06/05/2019, and 0.137 on 08/31/2019. We met with the patient again on 09/11/2019 to discuss salvage fossa radiotherapy, but he opted to continue  in active surveillance until the PSA reached 0.2 - 0.3 and consider treatment at that time.  His PSA decreased to 0.076 in 02/2020 with a gradual rise up to 0.14 in 08/2020 and 0.16 in 11/2020. Most recently, it increased to 0.31 in 02/2021 which prompted a PSMA scan for further evaluation. This scan was performed on 03/23/21 and confirmed no focal activity in the prostate bed but demonstrated a solitary, 8 mm hypermetabolic high left pelvic lymph node consistent with isolated nodal metastasis  He was also incidentally noted to have a nonspecific 6 mm RLL pulmonary nodule and a borderline ascending aortic aneurysm which will warrant follow up imaging with his PCP.  He has been kindly referred back to [place] today for discussion of potential salvage radiation options.   PREVIOUS RADIATION THERAPY: No  PAST MEDICAL HISTORY:  Past Medical History:  Diagnosis Date   Achilles tendinitis, left leg    At risk for sleep apnea    STOP--BANG SCORE= 5  (routed to pt's pcp in epic 11-13-2018)   Chronic insomnia 12/31/2015   Chronic neck pain    Congenital pectus excavatum    per pt had pulmonary test ,  60% restrictive by states asymptomatic   DDD (degenerative disc disease), cervical    Epicondylitis, lateral, right    GERD (gastroesophageal reflux disease)    History of exercise intolerance    09-02-2017  ETT,  negative for ischemia   History of pericarditis 1985   HTN (hypertension)    Hypercholesteremia 12/31/2015   Iritis  IT band syndrome 05/06/2014   Lower urinary tract symptoms (LUTS)    Neuropathy    bilateral arms with tingling and numbness at times due to cervical neck   OA (osteoarthritis)    Polyarthralgia    Prostate cancer [facility] Price)    urologist-  dr Isaiah Price--- Stage T1c, Gleason 3+4   Rheumatoid arthritis [facility])    rheumatologist-- dr Isaiah Price--- seropositive,multiple sites   Wears glasses       PAST SURGICAL HISTORY: Past Surgical History:  Procedure Laterality Date   ANTERIOR CERVICAL  DECOMP/DISCECTOMY FUSION  71   dr Isaiah Price   C5 -- 7  (per pt fusion failed)   CARPAL TUNNEL RELEASE Right done at same time of [place] fusion   CARPOMETACARPAL ([place]) FUSION OF THUMB Bilateral 2015 and 2014  approx.   CATARACT EXTRACTION W/ INTRAOCULAR LENS  IMPLANT, BILATERAL  2013   LYMPHADENECTOMY Bilateral 11/16/2018   Procedure: LYMPHADENECTOMY, PELVIC;  Surgeon: Isaiah Bring, MD;  Location: [facility];  Service: Urology;  Laterality: Bilateral;   ROBOT ASSISTED LAPAROSCOPIC RADICAL PROSTATECTOMY N/A 11/16/2018   Procedure: XI ROBOTIC ASSISTED LAPAROSCOPIC RADICAL PROSTATECTOMY LEVEL 2;  Surgeon: Isaiah Bring, MD;  Location: [facility];  Service: Urology;  Laterality: N/A;   SHOULDER SURGERY Left age 66   TONSILLECTOMY  child    FAMILY HISTORY:  Family History  Problem Relation Age of Onset   Heart disease Father        Aortic Valve Stenosis, CABG 66   Hypertension Father    Macular degeneration Mother    Heart disease Mother        MVR   Hypertension Mother    Osteosarcoma Paternal Uncle    Osteosarcoma Son     SOCIAL HISTORY:  Social History   Socioeconomic History   Marital status: Married    Spouse name: Isaiah Price   Number of children: Not on file   Years of education: Not on file   Highest education level: Associate degree: academic program  Occupational History    Comment: retired  Tobacco Use   Smoking status: Former    Packs/day: 1.00    Years: 25.00    Pack years: 25.00    Types: Cigarettes    Quit date: 12/13/2011    Years since quitting: 9.3   Smokeless tobacco: Never   Tobacco comments:    quit 1994  Vaping Use   Vaping Use: Never used  Substance and Sexual Activity   Alcohol use: Yes    Comment: occasional   Drug use: Never   Sexual activity: Not on file    Comment: vasectomy  Other Topics Concern   Not on file  Social History Narrative   Lives with wife, Retired from Adult nurse.     Social Determinants of Health   Financial Resource Strain:  Not on file  Food Insecurity: Not on file  Transportation Needs: Not on file  Physical Activity: Not on file  Stress: Not on file  Social Connections: Not on file  Intimate Partner Violence: Not on file  The patient is married and lives in [place]. 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  ALLERGIES: Nsaids, Brimonidine, Gabapentin, and Meloxicam  MEDICATIONS:  Current Outpatient Medications  Medication Sig Dispense Refill   allopurinol (ZYLOPRIM) 100 MG tablet Take 200 mg by mouth daily.     atorvastatin (LIPITOR) 40 MG tablet Take 40 mg by mouth at bedtime.      diltiazem (CARDIZEM) 90 MG tablet Take  90 mg by mouth at bedtime.      fluticasone (FLONASE) 50 MCG/ACT nasal spray Place 1 spray into the nose daily as needed for allergies.      folic acid (FOLVITE) 1 MG tablet Take 1 mg by mouth daily.     inFLIXimab-dyyb (INFLECTRA) 100 MG SOLR Inject into the vein.     methotrexate 2.5 MG tablet Take 5 tabs Tues only     omeprazole (PRILOSEC) 40 MG capsule Take 40 mg by mouth daily.      Polyvinyl Alcohol-Povidone (REFRESH OP) Place 1 drop into both eyes 2 (two) times daily as needed (dry eyes).      predniSONE (DELTASONE) 5 MG tablet Take 5 mg by mouth daily with breakfast.     tretinoin (RETIN-A) 0.05 % cream Apply a thin layer to face nightly     zolpidem (AMBIEN) 10 MG tablet Take 10 mg by mouth at bedtime.      ALPRAZolam (XANAX) 0.5 MG tablet Take 2 tablets approximately 45 minutes prior to the MRI study, take a third tablet if needed. 3 tablet 0   HYDROcodone-acetaminophen (NORCO) 10-325 MG tablet TAKE 1 TABLET EVERY 6 HOURS AS NEEDED FOR MODERATE TO SEVERE PAIN. (Patient not taking: Reported on 04/02/2021) 20 tablet 0   traMADol (ULTRAM) 50 MG tablet TAKE (1) TABLET EVERY EIGHT HOURS AS NEEDED FOR PAIN. (Patient not taking: Reported on 04/02/2021) 60 tablet 0   No current facility-administered medications for this encounter.    Facility-Administered Medications Ordered in Other Encounters  Medication Dose Route Frequency Provider Last Rate Last Admin   hydrocortisone sodium succinate (SOLU-CORTEF) injection 100 mg  100 mg Intravenous Once Isaiah Bring, MD        REVIEW OF SYSTEMS:  On review of systems, the patient reports that he is doing well overall. He denies any chest pain, shortness of breath, cough, fevers, chills, night sweats, or unintended weight changes. He denies any bowel or bladder disturbances, and denies abdominal pain, nausea or vomiting.  He continues with mild occasional UUI and is still wearing 1 shield in his shorts per day for protection.  He has noticed a gradual weaker flow of stream and increased hesitancy.  He has longstanding rheumatoid arthritis managed with Methotrexate and Inflectra infusions (biosimilar) under the care and direction of his rheumatologist, Dr. Amil Price. He had a recent injury. Likely partial tear of the biceps tendon on the right but otherwise, denies any new musculoskeletal or joint aches or pains, new skin lesions or concerns. A complete review of systems is obtained and is otherwise negative.   PHYSICAL EXAM:  Wt Readings from Last 3 Encounters:  04/02/21 194 lb 9.6 oz (88.3 kg)  03/31/21 200 lb (90.7 kg)  01/02/21 202 lb 6.4 oz (91.8 kg)   Temp Readings from Last 3 Encounters:  04/02/21 97.8 F (36.6 C)  11/17/18 98 F (36.7 C) (Oral)  11/13/18 98.4 F (36.9 C) (Oral)   BP Readings from Last 3 Encounters:  04/02/21 (!) 146/93  03/31/21 (!) 146/86  03/19/21 121/75   Pulse Readings from Last 3 Encounters:  04/02/21 90  03/19/21 92  01/02/21 (!) 101    In general this is a well appearing Caucasian gentleman in no acute distress. He's alert and oriented x4 and appropriate throughout the examination. Cardiopulmonary assessment is negative for acute distress and he exhibits normal effort.   KPS = 90  100 - Normal; no complaints; no evidence of  disease. 90   - Able to  carry on normal activity; minor signs or symptoms of disease. 80   - Normal activity with effort; some signs or symptoms of disease. 36   - Cares for self; unable to carry on normal activity or to do active work. 60   - Requires occasional assistance, but is able to care for most of his personal needs. 50   - Requires considerable assistance and frequent medical care. 42   - Disabled; requires special care and assistance. 35   - Severely disabled; hospital admission is indicated although death not imminent. 10   - Very sick; hospital admission necessary; active supportive treatment necessary. 10   - Moribund; fatal processes progressing rapidly. 0     - Dead  Karnofsky DA, Abelmann Sudley, Craver LS and Burchenal JH [phone]) The use of the nitrogen mustards in the palliative treatment of carcinoma: with particular reference to bronchogenic carcinoma Cancer 1 634-56  LABORATORY DATA:  Lab Results  Component Value Date   WBC 7.7 11/13/2018   HGB 12.8 (L) 11/17/2018   HCT 39.1 11/17/2018   MCV 88.9 11/13/2018   PLT 245 11/13/2018   Lab Results  Component Value Date   NA 138 11/13/2018   K 4.0 11/13/2018   CL 105 11/13/2018   CO2 22 11/13/2018   Lab Results  Component Value Date   ALT 29 12/23/2014   AST 16 12/23/2014   ALKPHOS 76 12/23/2014   BILITOT 0.9 12/23/2014     RADIOGRAPHY: NM PET (PSMA) SKULL TO MID THIGH  Result Date: 03/24/2021 CLINICAL DATA:  Prostate cancer, status post prostatectomy 11/16/2018. Rising PSA of 0.3. EXAM: NUCLEAR MEDICINE PET SKULL BASE TO THIGH TECHNIQUE: 8.0 mCi F18 Piflufolastat (Pylarify) was injected intravenously. Full-ring PET imaging was performed from the skull base to thigh after the radiotracer. CT data was obtained and used for attenuation correction and anatomic localization. COMPARISON:  11/15/2017 prostate MRI. FINDINGS: NECK No areas of abnormal tracer avidity. Incidental CT finding: No cervical adenopathy. Bilateral  carotid atherosclerosis. CHEST No thoracic nodal tracer avidity. 6 mm superior segment right lower lobe pulmonary nodule on 35/7. No significant tracer avidity. Incidental CT finding: Borderline ascending aortic aneurysm at 4.0 cm on 114/4. Aortic atherosclerosis. Tortuous thoracic aorta. Mild cardiomegaly. Multivessel coronary artery atherosclerosis. No thoracic adenopathy. ABDOMEN/PELVIS Prostate: No focal activity in the prostate bed. Lymph nodes: A node at the left common iliac bifurcation measures 8 mm and a S.U.V. max of 7.4 on 206/4. Liver: No evidence of liver metastasis Incidental CT finding: Normal adrenal glands. Abdominal aortic atherosclerosis. Nonaneurysmal infrarenal dilatation at up to 2.5 cm. Prostatectomy. Pelvic floor laxity. Scattered colonic diverticula. SKELETON No focal  activity to suggest skeletal metastasis. IMPRESSION: 1. High left pelvic node, at the level of the common iliac artery bifurcation is tracer avid, consistent with isolated nodal metastasis. 2. Superior segment right lower lobe 6 mm pulmonary nodule, nonspecific and not tracer avid. Consider chest CT follow-up at 6 months. 3. Borderline ascending aortic aneurysm, which could also be better evaluated at follow-up CT. 4. Coronary artery atherosclerosis. Aortic Atherosclerosis (ICD10-I70.0). Electronically Signed   By: Abigail Miyamoto M.D.   On: 03/24/2021 09:18      IMPRESSION/PLAN: 1. 70 y.o. 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 Today, we talked to the patient and his wife about the findings and workup thus far. We reviewed the  imaging from his recent PSMA scan with him and his wife. We discussed the natural history of prostate cancer and general treatment, highlighting the role of salvage radiotherapy in the management of oligometastatic disease. We  discussed the available radiation techniques, and focused on the details and logistics of delivery. The recommendation is to proceed with ST-ADT concurrent with a 7.5 week course of daily IMRT to the prostate fossa and pelvic lymph nodes with a boost to the fossa and isolated high left pelvic lymph node.  We reviewed the anticipated acute and late sequelae associated with radiation in this setting. The patient was encouraged to ask questions that were answered to his stated satisfaction.  At the end of our conversation, the patient would like to proceed with the ST-ADT concurrent with a 7.5 week course of daily IMRT to the prostate fossa and pelvic lymph nodes with a boost to the fossa and isolated high left pelvic lymph node, as recommended. We will share our discussion with Dr. Alinda Price and help to coordinate a follow up visit to start ADT, first available. He and Dr. Alinda Price had also discussed possible cystoscopy for further evaluation of the progressive LUTS and to r/o and/or manage possible bladder neck contracture vs. urethral stricture prior to starting radiation. He is also dealing with a torn/ruptured biceps tendon and pending MRI on Monday, 04/06/21, may need surgical repair if complete tear. We reassured him that once he starts ADT, that would allow a safe delay of treatment if necessary but he is hoping to start treatment sooner than later. He is going to hold his Methotrexate until approximately 3 weeks after treatment completion and he will follow up with Dr. Amil Price regarding his preference for continuing or stopping the Inflectra infusions during treatment but he is very hopeful that he will be able to continue this.  We enjoyed meeting with him and his wife today and look forward to continuing to participate in his care.     Nicholos Johns, PA-C    Tyler Pita, MD  [facility] Direct Dial: [phone]  Fax: [phone] [email or web address]  Skype   [email or web address]   This document serves as a record of services personally performed by Tyler Pita, MD and Freeman Caldron, PA-C. It was created on their behalf by Wilburn Mylar, a trained medical scribe. The creation of this record is based on the scribe's personal observations and the provider's statements to them. This document has been checked and approved by the attending provider.

## 2021-04-03 ENCOUNTER — Ambulatory Visit: Payer: Medicare Other

## 2021-04-05 ENCOUNTER — Other Ambulatory Visit: Payer: Self-pay

## 2021-04-05 ENCOUNTER — Ambulatory Visit
Admission: RE | Admit: 2021-04-05 | Discharge: 2021-04-05 | Disposition: A | Discharge status: Home / Self Care | Payer: Medicare Other | Source: Ambulatory Visit | Attending: Sports Medicine | Admitting: Sports Medicine

## 2021-04-05 DIAGNOSIS — M25521 Pain in right elbow: Secondary | ICD-10-CM

## 2021-04-06 ENCOUNTER — Telehealth: Payer: Self-pay | Admitting: Sports Medicine

## 2021-04-06 NOTE — Telephone Encounter (Signed)
[facility] Dr Tamera Punt  [address]  [place] [phone] Friday June 24th at [place] time is 59a

## 2021-04-06 NOTE — Telephone Encounter (Signed)
I spoke with Isaiah Price on the phone today after reviewing the MRI findings of his right elbow.  Biceps tendon has only a small partial tear but there is a near complete rupture of the common extensor tendon from the lateral epicondyle.  Based on these findings I recommend consultation with Dr. Tamera Punt to discuss treatment options.  Patient is in agreement with that plan.  He will follow-up with me as needed.

## 2021-04-08 ENCOUNTER — Ambulatory Visit: Payer: Medicare Other | Admitting: Physical Therapy

## 2021-04-10 ENCOUNTER — Encounter: Payer: Self-pay | Admitting: Physical Therapy

## 2021-04-10 ENCOUNTER — Ambulatory Visit: Payer: Medicare Other | Admitting: Physical Therapy

## 2021-04-10 ENCOUNTER — Other Ambulatory Visit: Payer: Self-pay

## 2021-04-10 DIAGNOSIS — R2681 Unsteadiness on feet: Secondary | ICD-10-CM

## 2021-04-10 DIAGNOSIS — R2689 Other abnormalities of gait and mobility: Secondary | ICD-10-CM | POA: Diagnosis not present

## 2021-04-10 DIAGNOSIS — M6281 Muscle weakness (generalized): Secondary | ICD-10-CM

## 2021-04-13 NOTE — Therapy (Signed)
[facility] [address] [place], [place], [address] Phone: [phone]   Fax:  [phone]  Physical Therapy Treatment  Patient Details  Name: Isaiah Price MRN: [id number] Date of Birth: [birth year] Referring Provider (PT): Margette Fast   Encounter Date: 04/10/2021   04/10/21 1410  PT Visits / Re-Eval  Visit Number 6  Number of Visits 17  Date for PT Re-Evaluation 06/09/21 (60 day poc, 90 day cert)  Authorization  Authorization Type Medicare so 10th visit progress note  PT Time Calculation  PT Start Time 8366  PT Stop Time 1445  PT Time Calculation (min) 43 min  PT - End of Session  Equipment Utilized During Treatment Gait belt  Activity Tolerance Patient tolerated treatment well  Behavior During Therapy [facility] for tasks assessed/performed     Past Medical History:  Diagnosis Date   Achilles tendinitis, left leg    At risk for sleep apnea    STOP--BANG SCORE= 5  (routed to pt's pcp in epic 11-13-2018)   Chronic insomnia 12/31/2015   Chronic neck pain    Congenital pectus excavatum    per pt had pulmonary test ,  60% restrictive by states asymptomatic   DDD (degenerative disc disease), cervical    Epicondylitis, lateral, right    GERD (gastroesophageal reflux disease)    History of exercise intolerance    09-02-2017  ETT,  negative for ischemia   History of pericarditis 1985   HTN (hypertension)    Hypercholesteremia 12/31/2015   Iritis    IT band syndrome 05/06/2014   Lower urinary tract symptoms (LUTS)    Neuropathy    bilateral arms with tingling and numbness at times due to cervical neck   OA (osteoarthritis)    Polyarthralgia    Prostate cancer [facility])    urologist-  dr Alinda Money--- Stage T1c, Gleason 3+4   Rheumatoid arthritis [facility])    rheumatologist-- dr Amil Amen--- seropositive,multiple sites   Wears glasses     Past Surgical History:  Procedure Laterality Date   ANTERIOR CERVICAL  DECOMP/DISCECTOMY FUSION  38   dr Sherwood Gambler   C5 -- 7  (per pt fusion failed)   CARPAL TUNNEL RELEASE Right done at same time of [place] fusion   CARPOMETACARPAL ([place]) FUSION OF THUMB Bilateral 2015 and 2014  approx.   CATARACT EXTRACTION W/ INTRAOCULAR LENS  IMPLANT, BILATERAL  2013   LYMPHADENECTOMY Bilateral 11/16/2018   Procedure: LYMPHADENECTOMY, PELVIC;  Surgeon: Raynelle Bring, MD;  Location: [facility];  Service: Urology;  Laterality: Bilateral;   ROBOT ASSISTED LAPAROSCOPIC RADICAL PROSTATECTOMY N/A 11/16/2018   Procedure: XI ROBOTIC ASSISTED LAPAROSCOPIC RADICAL PROSTATECTOMY LEVEL 2;  Surgeon: Raynelle Bring, MD;  Location: [facility];  Service: Urology;  Laterality: N/A;   SHOULDER SURGERY Left age 3   TONSILLECTOMY  child    There were no vitals filed for this visit.     04/10/21 1406  Symptoms/Limitations  Subjective Has torn 2 ligaments in right elbow, saw Dr. Tamera Punt who is addressing this non surgically. When he has time for the HEP (multiple appt's) they are going well. Lightheadedness is still occuring randomly, pt can not see a pattern.  Pertinent History PMH: rheumatoid arthritis, chronic neck pain, HTN, neuropathy in arms from neck, C5-7 ACDF, pectus excavatum which does contribute some to SOB and reports his FEV1 was decreased on testing, prostate cancer with history of surgery but just found reoccurance on bloodwork last week. Waiting for CT currently.  Patient Stated Goals Pt wants  to not feel lightheaded and feel steadier on feet.  Pain Assessment  Currently in Pain? Yes  Pain Score 5  Pain Location Elbow  Pain Orientation Right  Pain Descriptors / Indicators Aching;Sore;Tender  Pain Type Acute pain  Pain Onset In the past 7 days  Pain Frequency Constant  Aggravating Factors  movements of elbow  Pain Relieving Factors ice      04/10/21 1413  Functional Gait  Assessment  Gait assessed  Yes  Gait Level Surface 3 (5.49)  Change in Gait Speed 3  Gait with  Horizontal Head Turns 3  Gait with Vertical Head Turns 3  Gait and Pivot Turn 3  Step Over Obstacle 3  Gait with Narrow Base of Support 3  Gait with Eyes Closed 2 (6.28 sec's with veering toward right side)  Ambulating Backwards 3 (8.59 sec's)  Steps 2  Total Score 28  FGA comment: 28/30 25-28 low risk for falls          04/10/21 1413  Transfers  Transfers Sit to Stand;Stand to Sit  Sit to Stand 5: Supervision  Stand to Sit 5: Supervision  Ambulation/Gait  Ambulation/Gait Yes  Ambulation/Gait Assistance 5: Supervision  Ambulation/Gait Assistance Details no imbalance noted with long distance gait on indoor surfaces;  Ambulation Distance (Feet) 530 Feet (x1, plus around clinic with session)  Assistive device None  Gait Pattern Step-through pattern;Narrow base of support  Ambulation Surface Level;Indoor  Exercises  Exercises Other Exercises  Other Exercises  discussed HEP. He is able to do the sitting ones consistently while at time or in the car. Having issues with doing the corner ones due to multiple appts. Discussed working them into daily routine such as in kitchen while cooking, or bedroom before bed/after getting up.       04/10/21 1428  Balance Exercises: Standing  Standing Eyes Closed Narrow base of support (BOS);Wide (BOA);Head turns;Foam/compliant surface;Other reps (comment);30 secs;Limitations  Standing Eyes Closed Limitations on airex with no UE support:feete together for EC 30 sec's x 3 reps, then feet hip width apart for EC head movements left<>right, up<>down for ~10 reps each. min guard assist for balance with cues on weight shifting to assist with balance.  Partial Tandem Stance Eyes closed;Foam/compliant surface;3 reps;30 secs;Limitations  Partial Tandem Stance Limitations on airex with no UE support: 3 reps each foot forward with min guard assist, pt occasionally touching wall/chair for balance assistance           PT Short Term Goals - 04/10/21  1411       PT SHORT TERM GOAL #1   Title Pt will be independent with balance HEP to continue gains at home.    Baseline 04/10/21: met with current program    Status Achieved    Target Date 04/10/21      PT SHORT TERM GOAL #2   Title Pt will ambulate >500' on level surfaces independently with no significant staggering noted.    Baseline 04/10/21: met in session    Time --    Period --    Status Achieved    Target Date 04/10/21      PT SHORT TERM GOAL #3   Title Pt will increase FGA from 19 to >23/30 for improved balance and gait safety.    Baseline 04/10/21: 28/30 scored today    Time --    Period --    Status Achieved    Target Date 04/10/21  PT Long Term Goals - 04/10/21 2315       PT LONG TERM GOAL #1   Title Pt will ambulate up/down 8 steps with reciprocal pattern without rails independently for improved community access. (all LTGs due 05/10/21)    Time 8    Period Weeks    Status On-going      PT LONG TERM GOAL #2   Title Pt will decrease 5 x sit to stand from 18.53 sec to <15 sec for improved balance and functional strength.    Baseline 03/11/21 18.53 sec from chair without hands    Time 8    Period Weeks    Status On-going      PT LONG TERM GOAL #3   Title Pt will ambulate >1000' on varied outdoor surfaces mod I with LRAD versus no device for improved community mobility.    Time 8    Period Weeks    Status On-going      PT LONG TERM GOAL #4   Title Pt will increase FGA to >26/30 for improved balance and gait safety.    Baseline 04/10/21: 28/30 scored this session    Status Achieved      PT LONG TERM GOAL #5   Title Pt will be able to perform simulated gardening tasks with bending over without LOB for improved function.    Time 8    Period Weeks    Status On-going               04/10/21 1411  Plan  Clinical Impression Statement Today's skilled session focused on porgress toward STGs with all goals met. Pt also met his LTG for FGA  with a score of 28/30 today. Remainder of session continued to focus on balance with vision removed on compliant surfaces with min guard to min assist needed. No issues noted or reported in session. The pt should benefit from continued PT to progress toward unmet LTGs.  Personal Factors and Comorbidities Comorbidity 3+  Comorbidities rheumatoid arthritis, chronic neck pain, HTN, neuropathy in arms from neck, C5-7 ACDF  Examination-Activity Limitations Locomotion Level;Transfers;Stairs;Stand  Examination-Participation Restrictions Community Activity;Yard Work  Pt will benefit from skilled therapeutic intervention in order to improve on the following deficits Abnormal gait;Decreased balance;Decreased mobility;Decreased strength  Stability/Clinical Decision Making Evolving/Moderate complexity  Rehab Potential Good  PT Frequency 2x / week (plus eval)  PT Duration 8 weeks  PT Treatment/Interventions ADLs/Self Care Home Management;DME Instruction;Gait training;Stair training;Functional mobility training;Therapeutic activities;Therapeutic exercise;Balance training;Neuromuscular re-education;Manual techniques;Vestibular;Canalith Repostioning  PT Next Visit Plan continue balance training with working on static balance, eyes closed activities, head turns. dynamic gait activities  PT Home Exercise Plan Access Code [email or web address]  [email or web address] and Agree with Plan of Care Patient           Patient will benefit from skilled therapeutic intervention in order to improve the following deficits and impairments:  Abnormal gait, Decreased balance, Decreased mobility, Decreased strength  Visit Diagnosis: Other abnormalities of gait and mobility  Muscle weakness (generalized)  Unsteadiness on feet     Problem List Patient Active Problem List   Diagnosis Date Noted   Prostate cancer ([place]) 11/16/2018   Malignant neoplasm of prostate ([place]) 09/18/2018   Daytime somnolence 08/04/2017   Dyspnea on exertion  08/04/2017   Exercise intolerance 08/04/2017   Need for influenza vaccination 07/01/2017   Ulnar neuropathy at elbow, 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   Essential hypertension 01/12/2017   Groin strain 11/18/2016   Irregular bowel habits 11/18/2016   Rheumatoid arthritis (  [place]) 01/22/2016   Chronic insomnia 12/31/2015   Chronic pain 12/31/2015   Esophageal reflux 12/31/2015   Hypercholesteremia 12/31/2015   Chronic arthritis 12/31/2015   Wrist pain, right 12/01/2015   IT band syndrome 05/06/2014   Cervical radiculopathy 07/14/2011   Arthritis pain, hand 06/02/2011   Osteoarthritis of CMC joint of thumb 06/02/2011    Willow Ora, PTA, [facility] [address], [place], [place] [address] [phone] 04/13/21, 11:16 PM   Name: ANA LIAW MRN: [id number] Date of Birth: 01/14/1951

## 2021-04-15 ENCOUNTER — Ambulatory Visit: Payer: Medicare Other

## 2021-04-15 ENCOUNTER — Other Ambulatory Visit: Payer: Self-pay

## 2021-04-15 DIAGNOSIS — R2689 Other abnormalities of gait and mobility: Secondary | ICD-10-CM

## 2021-04-15 DIAGNOSIS — M6281 Muscle weakness (generalized): Secondary | ICD-10-CM

## 2021-04-15 DIAGNOSIS — R2681 Unsteadiness on feet: Secondary | ICD-10-CM

## 2021-04-15 NOTE — Therapy (Signed)
[facility] [address] [place], [place], [address] Phone: [phone]   Fax:  [phone]  Physical Therapy Treatment  Patient Details  Name: Isaiah Price MRN: [id number] Date of Birth: 12-04-1950 Referring Provider (PT): Margette Fast   Encounter Date: 04/15/2021   PT End of Session - 04/15/21 1410     Visit Number 7    Number of Visits 17    Date for PT Re-Evaluation 06/09/21   60 day poc, 90 day cert   Authorization Type Medicare so 10th visit progress note    PT Start Time 1402    PT Stop Time 1443    PT Time Calculation (min) 41 min    Equipment Utilized During Treatment Gait belt    Activity Tolerance Patient tolerated treatment well    Behavior During Therapy WFL for tasks assessed/performed             Past Medical History:  Diagnosis Date   Achilles tendinitis, left leg    At risk for sleep apnea    STOP--BANG SCORE= 5  (routed to pt's pcp in epic 11-13-2018)   Chronic insomnia 12/31/2015   Chronic neck pain    Congenital pectus excavatum    per pt had pulmonary test ,  60% restrictive by states asymptomatic   DDD (degenerative disc disease), cervical    Epicondylitis, lateral, right    GERD (gastroesophageal reflux disease)    History of exercise intolerance    09-02-2017  ETT,  negative for ischemia   History of pericarditis 1985   HTN (hypertension)    Hypercholesteremia 12/31/2015   Iritis    IT band syndrome 05/06/2014   Lower urinary tract symptoms (LUTS)    Neuropathy    bilateral arms with tingling and numbness at times due to cervical neck   OA (osteoarthritis)    Polyarthralgia    Prostate cancer [facility])    urologist-  dr Alinda Money--- Stage T1c, Gleason 3+4   Rheumatoid arthritis [facility])    rheumatologist-- dr Amil Amen--- seropositive,multiple sites   Wears glasses     Past Surgical History:  Procedure Laterality Date   ANTERIOR CERVICAL DECOMP/DISCECTOMY FUSION  32   dr  Sherwood Gambler   C5 -- 7  (per pt fusion failed)   CARPAL TUNNEL RELEASE Right done at same time of [place] fusion   CARPOMETACARPAL ([place]) FUSION OF THUMB Bilateral 2015 and 2014  approx.   CATARACT EXTRACTION W/ INTRAOCULAR LENS  IMPLANT, BILATERAL  2013   LYMPHADENECTOMY Bilateral 11/16/2018   Procedure: LYMPHADENECTOMY, PELVIC;  Surgeon: Raynelle Bring, MD;  Location: [facility];  Service: Urology;  Laterality: Bilateral;   ROBOT ASSISTED LAPAROSCOPIC RADICAL PROSTATECTOMY N/A 11/16/2018   Procedure: XI ROBOTIC ASSISTED LAPAROSCOPIC RADICAL PROSTATECTOMY LEVEL 2;  Surgeon: Raynelle Bring, MD;  Location: [facility];  Service: Urology;  Laterality: N/A;   SHOULDER SURGERY Left age 30   TONSILLECTOMY  child    There were no vitals filed for this visit.   Subjective Assessment - 04/15/21 1406     Subjective Patient reports that arm is still bothering him where the tendons are torn; Patient reports he will start radiation soon, will be everyday for 7.5 weeks. Patient reports overall feels like the balance; reports that the episodes of imbalance/lightheadedness occurs randomly.    Pertinent History PMH: rheumatoid arthritis, chronic neck pain, HTN, neuropathy in arms from neck, C5-7 ACDF, pectus excavatum which does contribute some to SOB and reports his FEV1 was decreased on testing, prostate  cancer with history of surgery but just found reoccurance on bloodwork last week. Waiting for CT currently.    Patient Stated Goals Pt wants to not feel lightheaded and feel steadier on feet.    Currently in Pain? Yes    Pain Score 5     Pain Location Arm    Pain Orientation Right    Pain Descriptors / Indicators Aching;Sore;Tender    Pain Type Acute pain    Pain Onset 1 to 4 weeks ago    Aggravating Factors  movements    Pain Relieving Factors ice                               [facility] Adult PT Treatment/Exercise - 04/15/21 0001       Ambulation/Gait   Ambulation/Gait Yes    Ambulation/Gait  Assistance 5: Supervision    Ambulation/Gait Assistance Details with balance activities throughout session    Ambulation Distance (Feet) --   clinic distance   Assistive device None    Gait Pattern Step-through pattern;Narrow base of support    Ambulation Surface Level;Indoor      High Level Balance   High Level Balance Activities Head turns   Gait with Vision Removed   High Level Balance Comments Around therapy gym, completed ambulation with horizontal/vertical head turns x 115 ft' each, no significant balance challenge. In hallway: completed gait with vision removed, 4 x 30', mild veering noted to R. CGA required.                 Balance Exercises - 04/15/21 0001       Balance Exercises: Standing   Standing Eyes Closed Narrow base of support (BOS);Head turns;Foam/compliant surface;3 reps;Limitations;Wide (BOA)    Standing Eyes Closed Limitations standing on airex narrow BOS no UE support, completed eyes closed 3 x 30 seconds; then completed addition of horizontal/vertical head turns x 10 reps with wide BOS    Tandem Stance Eyes open;2 reps;Limitations    Tandem Stance Time standing firm surface, full tandem x 30 seconds with eyes open, then addition of head turns x 10 reps, alternating foot position.    SLS with Vectors Solid surface;Intermittent upper extremity assist;Limitations    SLS with Vectors Limitations completed SLS step onto rockerboard positioned A/P with opposite toe tap to cone, completed that bilat alternating feet x 15 reps.    Rockerboard Anterior/posterior;EO;Head turns;Intermittent UE support;Limitations    Rockerboard Limitations completed standing with board A/P completed weight shift x 15 reps, intermittent touch A to // bars, improved balance noted to prior completion. cues for upright posture. then holding board steady x 30 secondsl then addition of horizontal/vertical head turns x 10 reps each direction. increased challenge with vertical > horizontal. seated  rest break required after completion.                 PT Short Term Goals - 04/10/21 1411       PT SHORT TERM GOAL #1   Title Pt will be independent with balance HEP to continue gains at home.    Baseline 04/10/21: met with current program    Status Achieved    Target Date 04/10/21      PT SHORT TERM GOAL #2   Title Pt will ambulate >500' on level surfaces independently with no significant staggering noted.    Baseline 04/10/21: met in session    Time --    Period --  Status Achieved    Target Date 04/10/21      PT SHORT TERM GOAL #3   Title Pt will increase FGA from 19 to >23/30 for improved balance and gait safety.    Baseline 04/10/21: 28/30 scored today    Time --    Period --    Status Achieved    Target Date 04/10/21               PT Long Term Goals - 04/10/21 2315       PT LONG TERM GOAL #1   Title Pt will ambulate up/down 8 steps with reciprocal pattern without rails independently for improved community access. (all LTGs due 05/10/21)    Time 8    Period Weeks    Status On-going      PT LONG TERM GOAL #2   Title Pt will decrease 5 x sit to stand from 18.53 sec to <15 sec for improved balance and functional strength.    Baseline 03/11/21 18.53 sec from chair without hands    Time 8    Period Weeks    Status On-going      PT LONG TERM GOAL #3   Title Pt will ambulate >1000' on varied outdoor surfaces mod I with LRAD versus no device for improved community mobility.    Time 8    Period Weeks    Status On-going      PT LONG TERM GOAL #4   Title Pt will increase FGA to >26/30 for improved balance and gait safety.    Baseline 04/10/21: 28/30 scored this session    Status Achieved      PT LONG TERM GOAL #5   Title Pt will be able to perform simulated gardening tasks with bending over without LOB for improved function.    Time 8    Period Weeks    Status On-going                   Plan - 04/15/21 1410     Clinical Impression  Statement Continued focus on high level balance and on complaint surfaces. Initiated SLS activities on rockerboard iwth patient tolerating well, intermittent UE support but overall demonstrating improved balance with PT services. Will continue to progress toward all LTGs.    Personal Factors and Comorbidities Comorbidity 3+    Comorbidities rheumatoid arthritis, chronic neck pain, HTN, neuropathy in arms from neck, C5-7 ACDF    Examination-Activity Limitations Locomotion Level;Transfers;Stairs;Stand    Examination-Participation Restrictions Community Activity;Yard Work    [employer] Evolving/Moderate complexity    Rehab Potential Good    PT Frequency 2x / week   plus eval   PT Duration 8 weeks    PT Treatment/Interventions ADLs/Self Care Home Management;DME Instruction;Gait training;Stair training;Functional mobility training;Therapeutic activities;Therapeutic exercise;Balance training;Neuromuscular re-education;Manual techniques;Vestibular;Canalith Repostioning    PT Next Visit Plan continue balance training with working on static balance, eyes closed activities, head turns. dynamic gait activities    PT Home Exercise Plan Access Code [email or web address]    [email or web address] and Agree with Plan of Care Patient             Patient will benefit from skilled therapeutic intervention in order to improve the following deficits and impairments:  Abnormal gait, Decreased balance, Decreased mobility, Decreased strength  Visit Diagnosis: Other abnormalities of gait and mobility  Unsteadiness on feet  Muscle weakness (generalized)     Problem List Patient Active Problem List   Diagnosis Date Noted   Prostate  cancer ([place]) 11/16/2018   Malignant neoplasm of prostate ([place]) 09/18/2018   Daytime somnolence 08/04/2017   Dyspnea on exertion 08/04/2017   Exercise intolerance 08/04/2017   Need for influenza vaccination 07/01/2017   Ulnar neuropathy at elbow, 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   Essential  hypertension 01/12/2017   Groin strain 11/18/2016   Irregular bowel habits 11/18/2016   Rheumatoid arthritis ([place]) 01/22/2016   Chronic insomnia 12/31/2015   Chronic pain 12/31/2015   Esophageal reflux 12/31/2015   Hypercholesteremia 12/31/2015   Chronic arthritis 12/31/2015   Wrist pain, right 12/01/2015   IT band syndrome 05/06/2014   Cervical radiculopathy 07/14/2011   Arthritis pain, hand 06/02/2011   Osteoarthritis of CMC joint of thumb 06/02/2011    Jones Bales, PT, DPT 04/15/2021, 3:27 PM  [facility] [address] [place], [place], [address] Phone: [phone]   Fax:  [phone]  Name: Isaiah Price MRN: [id number] Date of Birth: 28-Sep-1951

## 2021-04-17 ENCOUNTER — Ambulatory Visit: Payer: Medicare Other

## 2021-04-17 ENCOUNTER — Other Ambulatory Visit: Payer: Self-pay

## 2021-04-17 ENCOUNTER — Ambulatory Visit: Payer: Medicare Other | Admitting: Urology

## 2021-04-17 DIAGNOSIS — R2681 Unsteadiness on feet: Secondary | ICD-10-CM

## 2021-04-17 DIAGNOSIS — C61 Malignant neoplasm of prostate: Secondary | ICD-10-CM | POA: Diagnosis not present

## 2021-04-17 DIAGNOSIS — R2689 Other abnormalities of gait and mobility: Secondary | ICD-10-CM | POA: Insufficient documentation

## 2021-04-17 DIAGNOSIS — Z510 Encounter for antineoplastic radiation therapy: Secondary | ICD-10-CM | POA: Diagnosis not present

## 2021-04-17 DIAGNOSIS — M6281 Muscle weakness (generalized): Secondary | ICD-10-CM

## 2021-04-17 NOTE — Therapy (Signed)
[facility] [address] [place], [place], [address] Phone: [phone]   Fax:  [phone]  Physical Therapy Treatment  Patient Details  Name: Isaiah Price MRN: [id number] Date of Birth: July 15, 1951 Referring Provider (PT): Margette Fast   Encounter Date: 04/17/2021   PT End of Session - 04/17/21 1405     Visit Number 8    Number of Visits 17    Date for PT Re-Evaluation 06/09/21   60 day poc, 90 day cert   Authorization Type Medicare so 10th visit progress note    PT Start Time 1403    PT Stop Time 1444    PT Time Calculation (min) 41 min    Equipment Utilized During Treatment Gait belt    Activity Tolerance Patient tolerated treatment well    Behavior During Therapy WFL for tasks assessed/performed             Past Medical History:  Diagnosis Date   Achilles tendinitis, left leg    At risk for sleep apnea    STOP--BANG SCORE= 5  (routed to pt's pcp in epic 11-13-2018)   Chronic insomnia 12/31/2015   Chronic neck pain    Congenital pectus excavatum    per pt had pulmonary test ,  60% restrictive by states asymptomatic   DDD (degenerative disc disease), cervical    Epicondylitis, lateral, right    GERD (gastroesophageal reflux disease)    History of exercise intolerance    09-02-2017  ETT,  negative for ischemia   History of pericarditis 1985   HTN (hypertension)    Hypercholesteremia 12/31/2015   Iritis    IT band syndrome 05/06/2014   Lower urinary tract symptoms (LUTS)    Neuropathy    bilateral arms with tingling and numbness at times due to cervical neck   OA (osteoarthritis)    Polyarthralgia    Prostate cancer [facility])    urologist-  dr Alinda Money--- Stage T1c, Gleason 3+4   Rheumatoid arthritis [facility])    rheumatologist-- dr Amil Amen--- seropositive,multiple sites   Wears glasses     Past Surgical History:  Procedure Laterality Date   ANTERIOR CERVICAL DECOMP/DISCECTOMY FUSION  49   dr  Sherwood Gambler   C5 -- 7  (per pt fusion failed)   CARPAL TUNNEL RELEASE Right done at same time of [place] fusion   CARPOMETACARPAL ([place]) FUSION OF THUMB Bilateral 2015 and 2014  approx.   CATARACT EXTRACTION W/ INTRAOCULAR LENS  IMPLANT, BILATERAL  2013   LYMPHADENECTOMY Bilateral 11/16/2018   Procedure: LYMPHADENECTOMY, PELVIC;  Surgeon: Raynelle Bring, MD;  Location: [facility];  Service: Urology;  Laterality: Bilateral;   ROBOT ASSISTED LAPAROSCOPIC RADICAL PROSTATECTOMY N/A 11/16/2018   Procedure: XI ROBOTIC ASSISTED LAPAROSCOPIC RADICAL PROSTATECTOMY LEVEL 2;  Surgeon: Raynelle Bring, MD;  Location: [facility];  Service: Urology;  Laterality: N/A;   SHOULDER SURGERY Left age 53   TONSILLECTOMY  child    There were no vitals filed for this visit.   Subjective Assessment - 04/17/21 1405     Subjective Arm continues to bother him. No new news regarding the radiation. Patient reports had a lightheaded sensation this morning, reports felt like he was going to lose his balance posteriorly.    Pertinent History PMH: rheumatoid arthritis, chronic neck pain, HTN, neuropathy in arms from neck, C5-7 ACDF, pectus excavatum which does contribute some to SOB and reports his FEV1 was decreased on testing, prostate cancer with history of surgery but just found reoccurance on bloodwork  last week. Waiting for CT currently.    Patient Stated Goals Pt wants to not feel lightheaded and feel steadier on feet.    Currently in Pain? No/denies   None sedentary;   Pain Onset 1 to 4 weeks ago               [facility] Adult PT Treatment/Exercise - 04/17/21 0001       Transfers   Transfers Sit to Stand;Stand to Sit    Sit to Stand 5: Supervision    Stand to Sit 5: Supervision    Number of Reps 10 reps;2 sets    Comments completed sit <> stands from chair without UE support 2 x 10 reps, seated rest break required between sets due to SOB. HR: 104 bpm, 98% Sp02.      Ambulation/Gait   Ambulation/Gait Yes    Ambulation/Gait  Assistance 5: Supervision    Ambulation/Gait Assistance Details throughout therapy gym with activities    Ambulation Distance (Feet) --   clinic distance   Assistive device None    Gait Pattern Step-through pattern;Narrow base of support    Ambulation Surface Level;Indoor                 Balance Exercises - 04/17/21 0001       Balance Exercises: Standing   Tandem Stance Eyes open;3 reps;30 secs;Limitations    Tandem Stance Time firm surface, full tandem alternating foot position with completion. increased challenge with RLE posterior.    SLS Eyes open;Foam/compliant surface;Intermittent upper extremity support;Limitations    SLS Limitations completed toe taps to 6" step 2 x 10 reps forward to step alternating, then progressed to crossover toe tap x 15 reps bilat. No UE support.    Stepping Strategy Anterior;Posterior;Foam/compliant surface;Limitations    Stepping Strategy Limitations completed without UE support on red balance beam x 10 reps each direction, increased challenge noted with stepping posteiror back onto balance beam after forward stepping.    Balance Beam standing across red balance holding steady x 30 seconds; then addition of horizontal head turns x 10 reps, and vertical head turns x 10 reps. increased challenge with vertical > horizontal. intermittent UE support.    Sidestepping Foam/compliant support;Limitations    Sidestepping Limitations completed side stepping across blue balance beam x 3 laps down and back, cues for control with steps and upright posture.                 PT Short Term Goals - 04/10/21 1411       PT SHORT TERM GOAL #1   Title Pt will be independent with balance HEP to continue gains at home.    Baseline 04/10/21: met with current program    Status Achieved    Target Date 04/10/21      PT SHORT TERM GOAL #2   Title Pt will ambulate >500' on level surfaces independently with no significant staggering noted.    Baseline 04/10/21: met  in session    Time --    Period --    Status Achieved    Target Date 04/10/21      PT SHORT TERM GOAL #3   Title Pt will increase FGA from 19 to >23/30 for improved balance and gait safety.    Baseline 04/10/21: 28/30 scored today    Time --    Period --    Status Achieved    Target Date 04/10/21  PT Long Term Goals - 04/10/21 2315       PT LONG TERM GOAL #1   Title Pt will ambulate up/down 8 steps with reciprocal pattern without rails independently for improved community access. (all LTGs due 05/10/21)    Time 8    Period Weeks    Status On-going      PT LONG TERM GOAL #2   Title Pt will decrease 5 x sit to stand from 18.53 sec to <15 sec for improved balance and functional strength.    Baseline 03/11/21 18.53 sec from chair without hands    Time 8    Period Weeks    Status On-going      PT LONG TERM GOAL #3   Title Pt will ambulate >1000' on varied outdoor surfaces mod I with LRAD versus no device for improved community mobility.    Time 8    Period Weeks    Status On-going      PT LONG TERM GOAL #4   Title Pt will increase FGA to >26/30 for improved balance and gait safety.    Baseline 04/10/21: 28/30 scored this session    Status Achieved      PT LONG TERM GOAL #5   Title Pt will be able to perform simulated gardening tasks with bending over without LOB for improved function.    Time 8    Period Weeks    Status On-going                   Plan - 04/17/21 1445     Clinical Impression Statement Today's skilled PT session focused on continued balnace exercises and balance strategies on complaint surfaces. Progressed SLS to complaint surface with patient tolerating well. Intermittent standing/seated rest breaks required but overlal tolerating progression well. Will continue to progress toward all LTGs.    Personal Factors and Comorbidities Comorbidity 3+    Comorbidities rheumatoid arthritis, chronic neck pain, HTN, neuropathy in arms from  neck, C5-7 ACDF    Examination-Activity Limitations Locomotion Level;Transfers;Stairs;Stand    Examination-Participation Restrictions Community Activity;Yard Work    [employer] officer Evolving/Moderate complexity    Rehab Potential Good    PT Frequency 2x / week   plus eval   PT Duration 8 weeks    PT Treatment/Interventions ADLs/Self Care Home Management;DME Instruction;Gait training;Stair training;Functional mobility training;Therapeutic activities;Therapeutic exercise;Balance training;Neuromuscular re-education;Manual techniques;Vestibular;Canalith Repostioning    PT Next Visit Plan continue balance training with working on static balance, eyes closed activities, head turns. dynamic gait activities    PT Home Exercise Plan Access Code [email or web address]    [id number] and Agree with Plan of Care Patient             Patient will benefit from skilled therapeutic intervention in order to improve the following deficits and impairments:  Abnormal gait, Decreased balance, Decreased mobility, Decreased strength  Visit Diagnosis: Other abnormalities of gait and mobility  Unsteadiness on feet  Muscle weakness (generalized)     Problem List Patient Active Problem List   Diagnosis Date Noted   Prostate cancer ([place]) 11/16/2018   Malignant neoplasm of prostate ([place]) 09/18/2018   Daytime somnolence 08/04/2017   Dyspnea on exertion 08/04/2017   Exercise intolerance 08/04/2017   Need for influenza vaccination 07/01/2017   Ulnar neuropathy at elbow, 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   Essential hypertension 01/12/2017   Groin strain 11/18/2016   Irregular bowel habits 11/18/2016   Rheumatoid arthritis ([place]) 01/22/2016   Chronic insomnia 12/31/2015   Chronic pain 12/31/2015  Esophageal reflux 12/31/2015   Hypercholesteremia 12/31/2015   Chronic arthritis 12/31/2015   Wrist pain, right 12/01/2015   IT band syndrome 05/06/2014   Cervical radiculopathy 07/14/2011   Arthritis pain, hand  06/02/2011   Osteoarthritis of CMC joint of thumb 06/02/2011    Jones Bales, PT, DPT 04/17/2021, 2:57 PM  [facility] [address] [place], [place], [address] Phone: [phone]   Fax:  [phone]  Name: Isaiah Price MRN: [id number] Date of Birth: 1951-10-18

## 2021-04-22 ENCOUNTER — Ambulatory Visit: Payer: Medicare Other

## 2021-04-22 ENCOUNTER — Other Ambulatory Visit: Payer: Self-pay

## 2021-04-22 DIAGNOSIS — M6281 Muscle weakness (generalized): Secondary | ICD-10-CM

## 2021-04-22 DIAGNOSIS — Z510 Encounter for antineoplastic radiation therapy: Secondary | ICD-10-CM | POA: Diagnosis not present

## 2021-04-22 DIAGNOSIS — R2689 Other abnormalities of gait and mobility: Secondary | ICD-10-CM

## 2021-04-22 DIAGNOSIS — R2681 Unsteadiness on feet: Secondary | ICD-10-CM

## 2021-04-22 NOTE — Therapy (Signed)
[facility] [address] [place], [place], [address] Phone: [phone]   Fax:  [phone]  Physical Therapy Treatment  Patient Details  Name: Isaiah Price MRN: [id number] Date of Birth: [birth year] Referring Provider (PT): Margette Fast   Encounter Date: 04/22/2021   PT End of Session - 04/22/21 1239     Visit Number 9    Number of Visits 17    Date for PT Re-Evaluation 06/09/21   60 day poc, 90 day cert   Authorization Type Medicare so 10th visit progress note    PT Start Time 1232    PT Stop Time 1313    PT Time Calculation (min) 41 min    Equipment Utilized During Treatment Gait belt    Activity Tolerance Patient tolerated treatment well    Behavior During Therapy [facility] for tasks assessed/performed             Past Medical History:  Diagnosis Date   Achilles tendinitis, left leg    At risk for sleep apnea    STOP--BANG SCORE= 5  (routed to pt's pcp in epic 11-13-2018)   Chronic insomnia 12/31/2015   Chronic neck pain    Congenital pectus excavatum    per pt had pulmonary test ,  60% restrictive by states asymptomatic   DDD (degenerative disc disease), cervical    Epicondylitis, lateral, right    GERD (gastroesophageal reflux disease)    History of exercise intolerance    09-02-2017  ETT,  negative for ischemia   History of pericarditis 1985   HTN (hypertension)    Hypercholesteremia 12/31/2015   Iritis    IT band syndrome 05/06/2014   Lower urinary tract symptoms (LUTS)    Neuropathy    bilateral arms with tingling and numbness at times due to cervical neck   OA (osteoarthritis)    Polyarthralgia    Prostate cancer [facility])    urologist-  dr Alinda Money--- Stage T1c, Gleason 3+4   Rheumatoid arthritis [facility])    rheumatologist-- dr Amil Amen--- seropositive,multiple sites   Wears glasses     Past Surgical History:  Procedure Laterality Date   ANTERIOR CERVICAL DECOMP/DISCECTOMY FUSION  81   dr  Sherwood Gambler   C5 -- 7  (per pt fusion failed)   CARPAL TUNNEL RELEASE Right done at same time of [place] fusion   CARPOMETACARPAL ([place]) FUSION OF THUMB Bilateral 2015 and 2014  approx.   CATARACT EXTRACTION W/ INTRAOCULAR LENS  IMPLANT, BILATERAL  2013   LYMPHADENECTOMY Bilateral 11/16/2018   Procedure: LYMPHADENECTOMY, PELVIC;  Surgeon: Raynelle Bring, MD;  [facility];  Service: Urology;  Laterality: Bilateral;   ROBOT ASSISTED LAPAROSCOPIC RADICAL PROSTATECTOMY N/A 11/16/2018   Procedure: XI ROBOTIC ASSISTED LAPAROSCOPIC RADICAL PROSTATECTOMY LEVEL 2;  Surgeon: Raynelle Bring, MD;  [facility];  Service: Urology;  Laterality: N/A;   SHOULDER SURGERY Left age 52   TONSILLECTOMY  child    There were no vitals filed for this visit.   Subjective Assessment - 04/22/21 1235     Subjective No new changes, had a good weekend. Arm continues to be painful, has follow up on the 20th regarding arm.    Pertinent History PMH: rheumatoid arthritis, chronic neck pain, HTN, neuropathy in arms from neck, C5-7 ACDF, pectus excavatum which does contribute some to SOB and reports his FEV1 was decreased on testing, prostate cancer with history of surgery but just found reoccurance on bloodwork last week. Waiting for CT currently.    Patient  Stated Goals Pt wants to not feel lightheaded and feel steadier on feet.    Currently in Pain? Yes    Pain Score 4     Pain Location Arm    Pain Orientation Right    Pain Descriptors / Indicators Sharp;Shooting    Pain Type Acute pain    Pain Onset 1 to 4 weeks ago    Pain Frequency Constant               [facility] Adult PT Treatment/Exercise - 04/22/21 0001       Ambulation/Gait   Ambulation/Gait Yes    Ambulation/Gait Assistance 5: Supervision    Ambulation/Gait Assistance Details with high level balance activities    Assistive device None    Gait Pattern Step-through pattern;Narrow base of support    Ambulation Surface Level;Indoor      High Level  Balance   High Level Balance Activities Negotiating over obstacles    High Level Balance Comments completed ambulation with stepping over cones, with toe tap to cone prior to step over to promote SLS, completed 2 x 35'. Then progressed to Bilat toe tap to further promote balance, 2 x 35'. One instance of imbalance CGA required.              Balance Exercises - 04/22/21 0001       Balance Exercises: Standing   Standing Eyes Closed Narrow base of support (BOS);Head turns;Foam/compliant surface;Limitations;Wide (BOA)    Standing Eyes Closed Limitations standing on airex wide BOS > narrow BOS no UE support, completed eyes closed and horizontal/vertical head turns x 10 reps. increased challenge with horiz head turns.    SLS with Vectors Foam/compliant surface;Intermittent upper extremity assist;Limitations    SLS with Vectors Limitations standing at bottom of stairs: completed alternating toe taps forward x 10 reps, then completed crossover x 10 reps to first step. Then progressed to completed to second step x 15 reps forward, alternating. No UE Support    Balance Beam standing across red balance holding steady x 30 seconds; then addition of horizontal head turns x 10 reps, and vertical head turns x 10 reps. increased challenge with vertical > horizontal. progressed to EC, 3 x 30 seconds.    Other Standing Exercises completed SLS activities on firm surface with oppostite LE placed on soccerball completed A/P and lateral rolls x 10 reps each, on BLE. Then progressed to SLS on airex and completed second set A/P x 10 reps. Increased challenge with SLS on airex noted. CGA, intermittent UE support required.               PT Education - 04/22/21 1313     Education Details potential to call MD about appt for arm to determine if can be seen sooner.    Person(s) Educated Patient    Methods Explanation    Comprehension Verbalized understanding              PT Short Term Goals - 04/10/21  1411       PT SHORT TERM GOAL #1   Title Pt will be independent with balance HEP to continue gains at home.    Baseline 04/10/21: met with current program    Status Achieved    Target Date 04/10/21      PT SHORT TERM GOAL #2   Title Pt will ambulate >500' on level surfaces independently with no significant staggering noted.    Baseline 04/10/21: met in session    Time --  Period --    Status Achieved    Target Date 04/10/21      PT SHORT TERM GOAL #3   Title Pt will increase FGA from 19 to >23/30 for improved balance and gait safety.    Baseline 04/10/21: 28/30 scored today    Time --    Period --    Status Achieved    Target Date 04/10/21               PT Long Term Goals - 04/10/21 2315       PT LONG TERM GOAL #1   Title Pt will ambulate up/down 8 steps with reciprocal pattern without rails independently for improved community access. (all LTGs due 05/10/21)    Time 8    Period Weeks    Status On-going      PT LONG TERM GOAL #2   Title Pt will decrease 5 x sit to stand from 18.53 sec to <15 sec for improved balance and functional strength.    Baseline 03/11/21 18.53 sec from chair without hands    Time 8    Period Weeks    Status On-going      PT LONG TERM GOAL #3   Title Pt will ambulate >1000' on varied outdoor surfaces mod I with LRAD versus no device for improved community mobility.    Time 8    Period Weeks    Status On-going      PT LONG TERM GOAL #4   Title Pt will increase FGA to >26/30 for improved balance and gait safety.    Baseline 04/10/21: 28/30 scored this session    Status Achieved      PT LONG TERM GOAL #5   Title Pt will be able to perform simulated gardening tasks with bending over without LOB for improved function.    Time 8    Period Weeks    Status On-going                   Plan - 04/22/21 1313     Clinical Impression Statement Today's skilled PT session focused on continued balance exercises, with primary focus on  continuing to promote SLS. Intermittent rest breaks required. Patient continue to demo steady progress with PT services. Will continue to progress toward all LTGs.    Personal Factors and Comorbidities Comorbidity 3+    Comorbidities rheumatoid arthritis, chronic neck pain, HTN, neuropathy in arms from neck, C5-7 ACDF    Examination-Activity Limitations Locomotion Level;Transfers;Stairs;Stand    Examination-Participation Restrictions Community Activity;Yard Work    [employer] officer Evolving/Moderate complexity    Rehab Potential Good    PT Frequency 2x / week   plus eval   PT Duration 8 weeks    PT Treatment/Interventions ADLs/Self Care Home Management;DME Instruction;Gait training;Stair training;Functional mobility training;Therapeutic activities;Therapeutic exercise;Balance training;Neuromuscular re-education;Manual techniques;Vestibular;Canalith Repostioning    PT Next Visit Plan continue balance training with working on static balance, eyes closed activities, head turns. dynamic gait activities    PT Home Exercise Plan Access Code [email or web address]    [email or web address] and Agree with Plan of Care Patient             Patient will benefit from skilled therapeutic intervention in order to improve the following deficits and impairments:  Abnormal gait, Decreased balance, Decreased mobility, Decreased strength  Visit Diagnosis: Other abnormalities of gait and mobility  Unsteadiness on feet  Muscle weakness (generalized)     Problem List Patient Active Problem List   Diagnosis  Date Noted   Prostate cancer ([place]) 11/16/2018   Malignant neoplasm of prostate ([place]) 09/18/2018   Daytime somnolence 08/04/2017   Dyspnea on exertion 08/04/2017   Exercise intolerance 08/04/2017   Need for influenza vaccination 07/01/2017   Ulnar neuropathy at elbow, 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   Essential hypertension 01/12/2017   Groin strain 11/18/2016   Irregular bowel habits 11/18/2016   Rheumatoid  arthritis ([place]) 01/22/2016   Chronic insomnia 12/31/2015   Chronic pain 12/31/2015   Esophageal reflux 12/31/2015   Hypercholesteremia 12/31/2015   Chronic arthritis 12/31/2015   Wrist pain, right 12/01/2015   IT band syndrome 05/06/2014   Cervical radiculopathy 07/14/2011   Arthritis pain, hand 06/02/2011   Osteoarthritis of CMC joint of thumb 06/02/2011    Jones Bales, PT, DPT 04/22/2021, 1:14 PM  [facility] [address] [place], [place], [address] Phone: [phone]   Fax:  [phone]  Name: JAQUARIOUS GREY MRN: [id number] Date of Birth: [birth year]

## 2021-04-23 ENCOUNTER — Other Ambulatory Visit: Payer: Self-pay

## 2021-04-23 ENCOUNTER — Other Ambulatory Visit: Payer: Self-pay | Admitting: Sports Medicine

## 2021-04-23 MED ORDER — HYDROCODONE-ACETAMINOPHEN 10-325 MG PO TABS: ORAL_TABLET | ORAL | 0 refills | Status: DC

## 2021-04-24 ENCOUNTER — Other Ambulatory Visit: Payer: Self-pay

## 2021-04-24 ENCOUNTER — Ambulatory Visit: Payer: Medicare Other

## 2021-04-24 ENCOUNTER — Ambulatory Visit: Payer: Medicare Other | Admitting: Sports Medicine

## 2021-04-24 ENCOUNTER — Telehealth: Payer: Self-pay | Admitting: *Deleted

## 2021-04-24 DIAGNOSIS — Z510 Encounter for antineoplastic radiation therapy: Secondary | ICD-10-CM | POA: Diagnosis not present

## 2021-04-24 DIAGNOSIS — R2689 Other abnormalities of gait and mobility: Secondary | ICD-10-CM

## 2021-04-24 DIAGNOSIS — R2681 Unsteadiness on feet: Secondary | ICD-10-CM

## 2021-04-24 DIAGNOSIS — M6281 Muscle weakness (generalized): Secondary | ICD-10-CM

## 2021-04-24 NOTE — Therapy (Signed)
[facility] [address] [place], [place], [address] Phone: [phone]   Fax:  [phone]  Physical Therapy Treatment/Progress Note  Patient Details  Name: Isaiah Price MRN: [id number] Date of Birth: 1951/01/30 Referring Provider (PT): Margette Fast  Physical Therapy Progress Note   Dates of Reporting Period: 03/11/21 - 04/24/21  See Note below for Objective Data and Assessment of Progress/Goals.  Thank you for the referral of this patient. Guillermina City, PT, DPT   Encounter Date: 04/24/2021   PT End of Session - 04/24/21 1406     Visit Number 10    Number of Visits 17    Date for PT Re-Evaluation 06/09/21   60 day poc, 90 day cert   Authorization Type Medicare so 10th visit progress note    PT Start Time 0623    PT Stop Time 1443    PT Time Calculation (min) 40 min    Equipment Utilized During Treatment Gait belt    Activity Tolerance Patient tolerated treatment well    Behavior During Therapy WFL for tasks assessed/performed             Past Medical History:  Diagnosis Date   Achilles tendinitis, left leg    At risk for sleep apnea    STOP--BANG SCORE= 5  (routed to pt's pcp in epic 11-13-2018)   Chronic insomnia 12/31/2015   Chronic neck pain    Congenital pectus excavatum    per pt had pulmonary test ,  60% restrictive by states asymptomatic   DDD (degenerative disc disease), cervical    Epicondylitis, lateral, right    GERD (gastroesophageal reflux disease)    History of exercise intolerance    09-02-2017  ETT,  negative for ischemia   History of pericarditis 1985   HTN (hypertension)    Hypercholesteremia 12/31/2015   Iritis    IT band syndrome 05/06/2014   Lower urinary tract symptoms (LUTS)    Neuropathy    bilateral arms with tingling and numbness at times due to cervical neck   OA (osteoarthritis)    Polyarthralgia    Prostate cancer [facility])    urologist-  dr Alinda Money--- Stage T1c,  Gleason 3+4   Rheumatoid arthritis [facility])    rheumatologist-- dr Amil Amen--- seropositive,multiple sites   Wears glasses     Past Surgical History:  Procedure Laterality Date   ANTERIOR CERVICAL DECOMP/DISCECTOMY FUSION  31   dr Sherwood Gambler   C5 -- 7  (per pt fusion failed)   CARPAL TUNNEL RELEASE Right done at same time of [place] fusion   CARPOMETACARPAL ([place]) FUSION OF THUMB Bilateral 2015 and 2014  approx.   CATARACT EXTRACTION W/ INTRAOCULAR LENS  IMPLANT, BILATERAL  2013   LYMPHADENECTOMY Bilateral 11/16/2018   Procedure: LYMPHADENECTOMY, PELVIC;  Surgeon: Raynelle Bring, MD;  Location: [facility];  Service: Urology;  Laterality: Bilateral;   ROBOT ASSISTED LAPAROSCOPIC RADICAL PROSTATECTOMY N/A 11/16/2018   Procedure: XI ROBOTIC ASSISTED LAPAROSCOPIC RADICAL PROSTATECTOMY LEVEL 2;  Surgeon: Raynelle Bring, MD;  Location: [facility];  Service: Urology;  Laterality: N/A;   SHOULDER SURGERY Left age 33   TONSILLECTOMY  child    There were no vitals filed for this visit.   Subjective Assessment - 04/24/21 1405     Subjective Patient reports tried to reach out to MD regarding arm, but unable to get appt moved up. May have to push radiation start date back due to appt with MD regarding elbow.    Pertinent History PMH:  rheumatoid arthritis, chronic neck pain, HTN, neuropathy in arms from neck, C5-7 ACDF, pectus excavatum which does contribute some to SOB and reports his FEV1 was decreased on testing, prostate cancer with history of surgery but just found reoccurance on bloodwork last week. Waiting for CT currently.    Patient Stated Goals Pt wants to not feel lightheaded and feel steadier on feet.    Currently in Pain? Yes    Pain Score 4     Pain Location Arm    Pain Orientation Right    Pain Descriptors / Indicators Sharp;Shooting    Pain Type Acute pain    Pain Onset 1 to 4 weeks ago    Pain Frequency Constant              Neuro re-ed: sensory organization test performed with following  results: Conditions: 1: above age related norms 2: above age related norms 3: above age related norms  4: above age related norms 5: fall on 1st trial, 2nd above age related norm, 35rd below age related norm 6: 36st and 2nd above age related norm, 3rd below age related norm Composite score: 35 Sensory Analysis Som: above age related norm Vis: above age related norm Vest: below age related norm Pref: 100% Strategy analysis: Hip Dominant       COG alignment: Slight Posterior COG         [facility] Adult PT Treatment/Exercise - 04/24/21 0001       High Level Balance   High Level Balance Activities Head turns    High Level Balance Comments In hallway; completed ambulation with horizontal/vertical head turns 2 x 30' each, intermittent sway noted requiring intemrittent touch to wall. PT educating on addition to HEP.               PT Education - 04/24/21 1608     Education Details SOT Results    Person(s) Educated Patient    Methods Explanation    Comprehension Verbalized understanding              PT Short Term Goals - 04/10/21 1411       PT SHORT TERM GOAL #1   Title Pt will be independent with balance HEP to continue gains at home.    Baseline 04/10/21: met with current program    Status Achieved    Target Date 04/10/21      PT SHORT TERM GOAL #2   Title Pt will ambulate >500' on level surfaces independently with no significant staggering noted.    Baseline 04/10/21: met in session    Time --    Period --    Status Achieved    Target Date 04/10/21      PT SHORT TERM GOAL #3   Title Pt will increase FGA from 19 to >23/30 for improved balance and gait safety.    Baseline 04/10/21: 28/30 scored today    Time --    Period --    Status Achieved    Target Date 04/10/21               PT Long Term Goals - 04/10/21 2315       PT LONG TERM GOAL #1   Title Pt will ambulate up/down 8 steps with reciprocal pattern without rails independently for improved community  access. (all LTGs due 05/10/21)    Time 8    Period Weeks    Status On-going      PT LONG TERM GOAL #2  Title Pt will decrease 5 x sit to stand from 18.53 sec to <15 sec for improved balance and functional strength.    Baseline 03/11/21 18.53 sec from chair without hands    Time 8    Period Weeks    Status On-going      PT LONG TERM GOAL #3   Title Pt will ambulate >1000' on varied outdoor surfaces mod I with LRAD versus no device for improved community mobility.    Time 8    Period Weeks    Status On-going      PT LONG TERM GOAL #4   Title Pt will increase FGA to >26/30 for improved balance and gait safety.    Baseline 04/10/21: 28/30 scored this session    Status Achieved      PT LONG TERM GOAL #5   Title Pt will be able to perform simulated gardening tasks with bending over without LOB for improved function.    Time 8    Period Weeks    Status On-going                   Plan - 04/24/21 1608     Clinical Impression Statement Complete Sensory Organization Test for further assesment of balance.Composite score of 70%, which is above age related norms. However patient demonstrating decreased vestibular input with testing. Therefore patient will benefit from continued skilled PT services to promote improved vestibular input.    Personal Factors and Comorbidities Comorbidity 3+    Comorbidities rheumatoid arthritis, chronic neck pain, HTN, neuropathy in arms from neck, C5-7 ACDF    Examination-Activity Limitations Locomotion Level;Transfers;Stairs;Stand    Examination-Participation Restrictions Community Activity;Yard Work    [employer] officer Evolving/Moderate complexity    Rehab Potential Good    PT Frequency 2x / week   plus eval   PT Duration 8 weeks    PT Treatment/Interventions ADLs/Self Care Home Management;DME Instruction;Gait training;Stair training;Functional mobility training;Therapeutic activities;Therapeutic exercise;Balance  training;Neuromuscular re-education;Manual techniques;Vestibular;Canalith Repostioning    PT Next Visit Plan continue balance training with working on static balance, eyes closed activities, head turns. dynamic gait activities. activites to promote improved vestibular input.    PT Home Exercise Plan Access Code [email or web address]    PRFFMBWGY and Agree with Plan of Care Patient             Patient will benefit from skilled therapeutic intervention in order to improve the following deficits and impairments:  Abnormal gait, Decreased balance, Decreased mobility, Decreased strength  Visit Diagnosis: Other abnormalities of gait and mobility  Unsteadiness on feet  Muscle weakness (generalized)     Problem List Patient Active Problem List   Diagnosis Date Noted   Prostate cancer ([place]) 11/16/2018   Malignant neoplasm of prostate ([place]) 09/18/2018   Daytime somnolence 08/04/2017   Dyspnea on exertion 08/04/2017   Exercise intolerance 08/04/2017   Need for influenza vaccination 07/01/2017   Ulnar neuropathy at elbow, 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   Essential hypertension 01/12/2017   Groin strain 11/18/2016   Irregular bowel habits 11/18/2016   Rheumatoid arthritis ([place]) 01/22/2016   Chronic insomnia 12/31/2015   Chronic pain 12/31/2015   Esophageal reflux 12/31/2015   Hypercholesteremia 12/31/2015   Chronic arthritis 12/31/2015   Wrist pain, right 12/01/2015   IT band syndrome 05/06/2014   Cervical radiculopathy 07/14/2011   Arthritis pain, hand 06/02/2011   Osteoarthritis of CMC joint of thumb 06/02/2011    Jones Bales, PT, DPT 04/24/2021, 4:10 PM  [facility] [address] [place], [place], [address] Phone: [phone]   Fax:  [phone]  Name: Isaiah Price MRN: [id number] Date of Birth: 1951/06/01

## 2021-04-24 NOTE — Telephone Encounter (Signed)
Called patient to remind of sim appt. for 04-27-21- arrival time- 9:45 am @ [place], lvm for a return call

## 2021-04-27 ENCOUNTER — Ambulatory Visit
Admission: RE | Admit: 2021-04-27 | Discharge: 2021-04-27 | Disposition: A | Discharge status: Home / Self Care | Payer: Medicare Other | Source: Ambulatory Visit | Attending: Radiation Oncology | Admitting: Radiation Oncology

## 2021-04-27 ENCOUNTER — Other Ambulatory Visit: Payer: Self-pay

## 2021-04-27 DIAGNOSIS — C61 Malignant neoplasm of prostate: Secondary | ICD-10-CM | POA: Insufficient documentation

## 2021-04-27 DIAGNOSIS — M6281 Muscle weakness (generalized): Secondary | ICD-10-CM | POA: Insufficient documentation

## 2021-04-27 DIAGNOSIS — Z510 Encounter for antineoplastic radiation therapy: Secondary | ICD-10-CM | POA: Diagnosis not present

## 2021-04-27 DIAGNOSIS — R2681 Unsteadiness on feet: Secondary | ICD-10-CM | POA: Insufficient documentation

## 2021-04-27 DIAGNOSIS — R2689 Other abnormalities of gait and mobility: Secondary | ICD-10-CM | POA: Insufficient documentation

## 2021-04-27 NOTE — Progress Notes (Signed)
  Radiation Oncology         [phone] ________________________________  Name: Isaiah Price MRN: [id number]  Date: 04/27/2021  DOB: 29-Dec-1950  SIMULATION AND TREATMENT PLANNING NOTE    ICD-10-CM   1. Malignant neoplasm of prostate ([place])  C61       DIAGNOSIS:  70 y.o. 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  NARRATIVE:  The patient was brought to the [facility].  Identity was confirmed.  All relevant records and images related to the planned course of therapy were reviewed.  The patient freely provided informed written consent to proceed with treatment after reviewing the details related to the planned course of therapy. The consent form was witnessed and verified by the simulation staff.  Then, the patient was set-up in a stable reproducible supine position for radiation therapy.  A vacuum lock pillow device was custom fabricated to position his legs in a reproducible immobilized position.  Then, I performed a urethrogram under sterile conditions to identify the prostatic bed.  CT images were obtained.  Surface markings were placed.  The CT images were loaded into the planning software.  Then the prostate bed target, pelvic lymph node target and avoidance structures including the rectum, bladder, bowel and hips were contoured.  Treatment planning then occurred.  The radiation prescription was entered and confirmed.  A total of one complex treatment devices were fabricated. I have requested : Intensity Modulated Radiotherapy (IMRT) is medically necessary for this case for the following reason:  Rectal sparing.Marland Kitchen  PLAN:  The patient will receive 45 Gy in 25 fractions of 1.8 Gy, followed by a boost to the prostate bed to a total dose of 68.4 Gy with 13 additional fractions of 1.8 Gy.  During the boost, the  positive left pelvic node will be treated to 26 Gy in 13 fractions of 2 Gy using simultaneous integrated boost (SIB).   ________________________________  Sheral Apley Tammi Klippel, M.D.

## 2021-04-29 ENCOUNTER — Other Ambulatory Visit: Payer: Self-pay

## 2021-04-29 ENCOUNTER — Ambulatory Visit: Payer: Medicare Other | Admitting: Physical Therapy

## 2021-04-29 ENCOUNTER — Encounter: Payer: Self-pay | Admitting: Physical Therapy

## 2021-04-29 DIAGNOSIS — M6281 Muscle weakness (generalized): Secondary | ICD-10-CM

## 2021-04-29 DIAGNOSIS — R2681 Unsteadiness on feet: Secondary | ICD-10-CM

## 2021-04-29 DIAGNOSIS — Z510 Encounter for antineoplastic radiation therapy: Secondary | ICD-10-CM | POA: Diagnosis not present

## 2021-04-29 DIAGNOSIS — R2689 Other abnormalities of gait and mobility: Secondary | ICD-10-CM

## 2021-04-29 NOTE — Therapy (Signed)
[facility] [address] [place], [place], [address] Phone: [phone]   Fax:  [phone]  Physical Therapy Treatment  Patient Details  Name: Isaiah Price MRN: [id number] Date of Birth: 04/23/1951 Referring Provider (PT): Margette Fast   Encounter Date: 04/29/2021   PT End of Session - 04/29/21 0807     Visit Number 11    Number of Visits 17    Date for PT Re-Evaluation 06/09/21   60 day poc, 90 day cert   Authorization Type Medicare so 10th visit progress note    Progress Note Due on Visit 20    PT Start Time 0803    PT Stop Time 0845    PT Time Calculation (min) 42 min    Equipment Utilized During Treatment Gait belt    Activity Tolerance Patient tolerated treatment well    Behavior During Therapy [facility] for tasks assessed/performed             Past Medical History:  Diagnosis Date   Achilles tendinitis, left leg    At risk for sleep apnea    STOP--BANG SCORE= 5  (routed to pt's pcp in epic 11-13-2018)   Chronic insomnia 12/31/2015   Chronic neck pain    Congenital pectus excavatum    per pt had pulmonary test ,  60% restrictive by states asymptomatic   DDD (degenerative disc disease), cervical    Epicondylitis, lateral, right    GERD (gastroesophageal reflux disease)    History of exercise intolerance    09-02-2017  ETT,  negative for ischemia   History of pericarditis 1985   HTN (hypertension)    Hypercholesteremia 12/31/2015   Iritis    IT band syndrome 05/06/2014   Lower urinary tract symptoms (LUTS)    Neuropathy    bilateral arms with tingling and numbness at times due to cervical neck   OA (osteoarthritis)    Polyarthralgia    Prostate cancer [facility])    urologist-  dr Alinda Money--- Stage T1c, Gleason 3+4   Rheumatoid arthritis [facility])    rheumatologist-- dr Amil Amen--- seropositive,multiple sites   Wears glasses     Past Surgical History:  Procedure Laterality Date   ANTERIOR CERVICAL  DECOMP/DISCECTOMY FUSION  53   dr Sherwood Gambler   C5 -- 7  (per pt fusion failed)   CARPAL TUNNEL RELEASE Right done at same time of [place] fusion   CARPOMETACARPAL ([place]) FUSION OF THUMB Bilateral 2015 and 2014  approx.   CATARACT EXTRACTION W/ INTRAOCULAR LENS  IMPLANT, BILATERAL  2013   LYMPHADENECTOMY Bilateral 11/16/2018   Procedure: LYMPHADENECTOMY, PELVIC;  Surgeon: Raynelle Bring, MD;  Location: [facility];  Service: Urology;  Laterality: Bilateral;   ROBOT ASSISTED LAPAROSCOPIC RADICAL PROSTATECTOMY N/A 11/16/2018   Procedure: XI ROBOTIC ASSISTED LAPAROSCOPIC RADICAL PROSTATECTOMY LEVEL 2;  Surgeon: Raynelle Bring, MD;  Location: [facility];  Service: Urology;  Laterality: N/A;   SHOULDER SURGERY Left age 75   TONSILLECTOMY  child    There were no vitals filed for this visit.   Subjective Assessment - 04/29/21 0806     Subjective No new complaints. HEP is going well when he gets to it. Still having a few episodes of the sudden backward loss of balance.    Pertinent History PMH: rheumatoid arthritis, chronic neck pain, HTN, neuropathy in arms from neck, C5-7 ACDF, pectus excavatum which does contribute some to SOB and reports his FEV1 was decreased on testing, prostate cancer with history of surgery but just found  reoccurance on bloodwork last week. Waiting for CT currently.    Patient Stated Goals Pt wants to not feel lightheaded and feel steadier on feet.    Currently in Pain? Yes    Pain Score 3     Pain Location Arm    Pain Orientation Right    Pain Descriptors / Indicators Aching;Sharp;Shooting    Pain Type Acute pain    Pain Onset 1 to 4 weeks ago    Pain Frequency Constant    Aggravating Factors  certain movements    Pain Relieving Factors ice                       [facility] Adult PT Treatment/Exercise - 04/29/21 0808       Transfers   Transfers Sit to Stand;Stand to Sit    Sit to Stand 5: Supervision    Stand to Sit 5: Supervision      Ambulation/Gait    Ambulation/Gait Yes    Ambulation/Gait Assistance 5: Supervision    Ambulation/Gait Assistance Details around clinic with session    Assistive device None    Gait Pattern Step-through pattern;Narrow base of support    Ambulation Surface Level;Indoor      Neuro Re-ed    Neuro Re-ed Details  for balance/NMR: gait along ~50 foot hallway working on head movements left<>fwd<>right, then up<>fwd<>down for 4 laps each with min guard to min assist, veering noted with right bias and decr gait speed noted (lateral>vertical head movements); gait around track while tracking a ball with head/eyes in up<>down direction, left<>right direction, then in circles CW for 1 lap each with min guard assist for balance. no symptoms reported with cues to keep eyes on ball needed.                 Balance Exercises - 04/29/21 0816       Balance Exercises: Standing   Rockerboard Anterior/posterior;Lateral;Head turns;EO;EC;30 seconds;Other reps (comment);Limitations    Rockerboard Limitations performed both ways on balance board: rocking the board with emphasis on tall posture with EO, progressing to EC for ~15 reps each, min guard to min assist for balance, cues on posture/weight shifting to assist with balance; then holding the board steady for EC 30 sec's x 3 reps, progressing to EC head movements left<>right, up<>down for ~10 reps each. Min assist for balance with cues on posture and weight shifting to assist with balance.                 PT Short Term Goals - 04/10/21 1411       PT SHORT TERM GOAL #1   Title Pt will be independent with balance HEP to continue gains at home.    Baseline 04/10/21: met with current program    Status Achieved    Target Date 04/10/21      PT SHORT TERM GOAL #2   Title Pt will ambulate >500' on level surfaces independently with no significant staggering noted.    Baseline 04/10/21: met in session    Time --    Period --    Status Achieved    Target Date 04/10/21       PT SHORT TERM GOAL #3   Title Pt will increase FGA from 19 to >23/30 for improved balance and gait safety.    Baseline 04/10/21: 28/30 scored today    Time --    Period --    Status Achieved    Target Date 04/10/21  PT Long Term Goals - 04/10/21 2315       PT LONG TERM GOAL #1   Title Pt will ambulate up/down 8 steps with reciprocal pattern without rails independently for improved community access. (all LTGs due 05/10/21)    Time 8    Period Weeks    Status On-going      PT LONG TERM GOAL #2   Title Pt will decrease 5 x sit to stand from 18.53 sec to <15 sec for improved balance and functional strength.    Baseline 03/11/21 18.53 sec from chair without hands    Time 8    Period Weeks    Status On-going      PT LONG TERM GOAL #3   Title Pt will ambulate >1000' on varied outdoor surfaces mod I with LRAD versus no device for improved community mobility.    Time 8    Period Weeks    Status On-going      PT LONG TERM GOAL #4   Title Pt will increase FGA to >26/30 for improved balance and gait safety.    Baseline 04/10/21: 28/30 scored this session    Status Achieved      PT LONG TERM GOAL #5   Title Pt will be able to perform simulated gardening tasks with bending over without LOB for improved function.    Time 8    Period Weeks    Status On-going                   Plan - 04/29/21 0807     Clinical Impression Statement Today's skilled session continued to focus on balance with emphasis on increased vestibular imput. No issues noted or reported in session. The pt is progressing toward goals and should benefit from continued PT to progress toward unmet goals.    Personal Factors and Comorbidities Comorbidity 3+    Comorbidities rheumatoid arthritis, chronic neck pain, HTN, neuropathy in arms from neck, C5-7 ACDF    Examination-Activity Limitations Locomotion Level;Transfers;Stairs;Stand    Examination-Participation Restrictions Community  Activity;Yard Work    [employer] officer Evolving/Moderate complexity    Rehab Potential Good    PT Frequency 2x / week   plus eval   PT Duration 8 weeks    PT Treatment/Interventions ADLs/Self Care Home Management;DME Instruction;Gait training;Stair training;Functional mobility training;Therapeutic activities;Therapeutic exercise;Balance training;Neuromuscular re-education;Manual techniques;Vestibular;Canalith Repostioning    PT Next Visit Plan continue balance training with working on static balance, eyes closed activities, head turns. dynamic gait activities. activites to promote improved vestibular input.    PT Home Exercise Plan Access Code [email or web address]    [email or web address] and Agree with Plan of Care Patient             Patient will benefit from skilled therapeutic intervention in order to improve the following deficits and impairments:  Abnormal gait, Decreased balance, Decreased mobility, Decreased strength  Visit Diagnosis: Other abnormalities of gait and mobility  Unsteadiness on feet  Muscle weakness (generalized)     Problem List Patient Active Problem List   Diagnosis Date Noted   Prostate cancer ([place]) 11/16/2018   Malignant neoplasm of prostate ([place]) 09/18/2018   Daytime somnolence 08/04/2017   Dyspnea on exertion 08/04/2017   Exercise intolerance 08/04/2017   Need for influenza vaccination 07/01/2017   Ulnar neuropathy at elbow, 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   Essential hypertension 01/12/2017   Groin strain 11/18/2016   Irregular bowel habits 11/18/2016   Rheumatoid arthritis ([place]) 01/22/2016   Chronic insomnia 12/31/2015  Chronic pain 12/31/2015   Esophageal reflux 12/31/2015   Hypercholesteremia 12/31/2015   Chronic arthritis 12/31/2015   Wrist pain, right 12/01/2015   IT band syndrome 05/06/2014   Cervical radiculopathy 07/14/2011   Arthritis pain, hand 06/02/2011   Osteoarthritis of CMC joint of thumb 06/02/2011    Willow Ora, PTA, [facility]  Neuro [facility] [address] [place], [place] [address] [phone] 04/29/21, 10:28 PM   Name: Isaiah Price MRN: [id number] Date of Birth: 02-Mar-1951

## 2021-04-30 ENCOUNTER — Ambulatory Visit: Payer: Medicare Other | Admitting: Physical Therapy

## 2021-04-30 ENCOUNTER — Encounter: Payer: Self-pay | Admitting: Physical Therapy

## 2021-04-30 DIAGNOSIS — R2689 Other abnormalities of gait and mobility: Secondary | ICD-10-CM

## 2021-04-30 DIAGNOSIS — R2681 Unsteadiness on feet: Secondary | ICD-10-CM

## 2021-04-30 DIAGNOSIS — M6281 Muscle weakness (generalized): Secondary | ICD-10-CM

## 2021-04-30 DIAGNOSIS — Z510 Encounter for antineoplastic radiation therapy: Secondary | ICD-10-CM | POA: Diagnosis not present

## 2021-05-01 ENCOUNTER — Ambulatory Visit: Payer: Medicare Other | Admitting: Urology

## 2021-05-01 ENCOUNTER — Ambulatory Visit: Payer: Medicare Other

## 2021-05-01 ENCOUNTER — Ambulatory Visit: Payer: Medicare Other | Admitting: Physical Therapy

## 2021-05-01 NOTE — Therapy (Signed)
[facility] [address] [place], [place], [address] Phone: [phone]   Fax:  [phone]  Physical Therapy Treatment  Patient Details  Name: Isaiah Price MRN: [id number] Date of Birth: 05-11-1951 Referring Provider (PT): Margette Fast   Encounter Date: 04/30/2021   PT End of Session - 04/30/21 1454     Visit Number 12    Number of Visits 17    Date for PT Re-Evaluation 06/09/21   60 day poc, 90 day cert   Authorization Type Medicare so 10th visit progress note    Progress Note Due on Visit 20    PT Start Time 1448    PT Stop Time 1530    PT Time Calculation (min) 42 min    Equipment Utilized During Treatment Gait belt    Activity Tolerance Patient tolerated treatment well    Behavior During Therapy [facility] for tasks assessed/performed             Past Medical History:  Diagnosis Date   Achilles tendinitis, left leg    At risk for sleep apnea    STOP--BANG SCORE= 5  (routed to pt's pcp in epic 11-13-2018)   Chronic insomnia 12/31/2015   Chronic neck pain    Congenital pectus excavatum    per pt had pulmonary test ,  60% restrictive by states asymptomatic   DDD (degenerative disc disease), cervical    Epicondylitis, lateral, right    GERD (gastroesophageal reflux disease)    History of exercise intolerance    09-02-2017  ETT,  negative for ischemia   History of pericarditis 1985   HTN (hypertension)    Hypercholesteremia 12/31/2015   Iritis    IT band syndrome 05/06/2014   Lower urinary tract symptoms (LUTS)    Neuropathy    bilateral arms with tingling and numbness at times due to cervical neck   OA (osteoarthritis)    Polyarthralgia    Prostate cancer [facility])    urologist-  dr Alinda Money--- Stage T1c, Gleason 3+4   Rheumatoid arthritis [facility])    rheumatologist-- dr Amil Amen--- seropositive,multiple sites   Wears glasses     Past Surgical History:  Procedure Laterality Date   ANTERIOR CERVICAL  DECOMP/DISCECTOMY FUSION  38   dr Sherwood Gambler   C5 -- 7  (per pt fusion failed)   CARPAL TUNNEL RELEASE Right done at same time of [place] fusion   CARPOMETACARPAL ([place]) FUSION OF THUMB Bilateral 2015 and 2014  approx.   CATARACT EXTRACTION W/ INTRAOCULAR LENS  IMPLANT, BILATERAL  2013   LYMPHADENECTOMY Bilateral 11/16/2018   Procedure: LYMPHADENECTOMY, PELVIC;  Surgeon: Raynelle Bring, MD;  Location: [facility];  Service: Urology;  Laterality: Bilateral;   ROBOT ASSISTED LAPAROSCOPIC RADICAL PROSTATECTOMY N/A 11/16/2018   Procedure: XI ROBOTIC ASSISTED LAPAROSCOPIC RADICAL PROSTATECTOMY LEVEL 2;  Surgeon: Raynelle Bring, MD;  Location: [facility];  Service: Urology;  Laterality: N/A;   SHOULDER SURGERY Left age 11   TONSILLECTOMY  child    There were no vitals filed for this visit.   Subjective Assessment - 04/30/21 1451     Subjective Woke up feeling off and today has continued to be an "off" day. Did have an infusion yesterday afternoon so thinks that could be contributing to his feelling today. No falls. No pain, joints do ache today.    Pertinent History PMH: rheumatoid arthritis, chronic neck pain, HTN, neuropathy in arms from neck, C5-7 ACDF, pectus excavatum which does contribute some to SOB and reports his FEV1  was decreased on testing, prostate cancer with history of surgery but just found reoccurance on bloodwork last week. Waiting for CT currently.    Patient Stated Goals Pt wants to not feel lightheaded and feel steadier on feet.    Currently in Pain? No/denies    Pain Score 0-No pain   arm is "not bad" today. some soreness still along lateral forearm                              [facility] Adult PT Treatment/Exercise - 04/30/21 1455       Transfers   Transfers Sit to Stand;Stand to Sit    Sit to Stand 5: Supervision    Stand to Sit 5: Supervision      Ambulation/Gait   Ambulation/Gait Yes    Ambulation/Gait Assistance 5: Supervision    Ambulation/Gait Assistance  Details on balance issues noted with gait outdoors on uneven surfaces    Ambulation Distance (Feet) 1000 Feet   x1 in/outdoors surfaces; plus around gym with session   Assistive device None    Gait Pattern Step-through pattern;Narrow base of support    Ambulation Surface Level;Indoor;Unlevel;Outdoor;Paved      High Level Balance   High Level Balance Activities Side stepping;Marching forwards;Marching backwards;Tandem walking   tandem/heel/toe walking forward/backwards   High Level Balance Comments on blue/red mats next to counter wtih left UE support at times on counter for 3 laps each/each way. min guard to min assist for balance.                 Balance Exercises - 04/30/21 1504       Balance Exercises: Standing   Standing Eyes Closed Narrow base of support (BOS);Head turns;Foam/compliant surface;Limitations;Wide (BOA);Other reps (comment);30 secs    Standing Eyes Closed Limitations on airex with no UE sujpport: feet together for EC 30 sec's x 3 reps, then feet hip width apart for EC head movements left<>right, up<>down and diagonal both ways x 10 resp                 PT Short Term Goals - 04/10/21 1411       PT SHORT TERM GOAL #1   Title Pt will be independent with balance HEP to continue gains at home.    Baseline 04/10/21: met with current program    Status Achieved    Target Date 04/10/21      PT SHORT TERM GOAL #2   Title Pt will ambulate >500' on level surfaces independently with no significant staggering noted.    Baseline 04/10/21: met in session    Time --    Period --    Status Achieved    Target Date 04/10/21      PT SHORT TERM GOAL #3   Title Pt will increase FGA from 19 to >23/30 for improved balance and gait safety.    Baseline 04/10/21: 28/30 scored today    Time --    Period --    Status Achieved    Target Date 04/10/21               PT Long Term Goals - 04/30/21 1454       PT LONG TERM GOAL #1   Title Pt will ambulate up/down 8  steps with reciprocal pattern without rails independently for improved community access. (all LTGs due 05/10/21)    Time 8    Period Weeks    Status On-going  PT LONG TERM GOAL #2   Title Pt will decrease 5 x sit to stand from 18.53 sec to <15 sec for improved balance and functional strength.    Baseline 03/11/21 18.53 sec from chair without hands    Time 8    Period Weeks    Status On-going      PT LONG TERM GOAL #3   Title Pt will ambulate >1000' on varied outdoor surfaces mod I with LRAD versus no device for improved community mobility.    Time 8    Period Weeks    Status On-going      PT LONG TERM GOAL #4   Title Pt will increase FGA to >26/30 for improved balance and gait safety.    Baseline 04/10/21: 28/30 scored this session    Status Achieved      PT LONG TERM GOAL #5   Title Pt will be able to perform simulated gardening tasks with bending over without LOB for improved function.    Time 8    Period Weeks    Status On-going                   Plan - 04/30/21 1454     Clinical Impression Statement Today's skilled session conntinued to focus on balance training with need for increaased vestibular imput. No issues noted or reported in session. Discussed current plan of care and that next week is pt's last scheduled appt. Pt in agreement to wrap up a that time.    Personal Factors and Comorbidities Comorbidity 3+    Comorbidities rheumatoid arthritis, chronic neck pain, HTN, neuropathy in arms from neck, C5-7 ACDF    Examination-Activity Limitations Locomotion Level;Transfers;Stairs;Stand    Examination-Participation Restrictions Community Activity;Yard Work    [employer]chant [employer] officer Evolving/Moderate complexity    Rehab Potential Good    PT Frequency 2x / week   plus eval   PT Duration 8 weeks    PT Treatment/Interventions ADLs/Self Care Home Management;DME Instruction;Gait training;Stair training;Functional mobility training;Therapeutic  activities;Therapeutic exercise;Balance training;Neuromuscular re-education;Manual techniques;Vestibular;Canalith Repostioning    PT Next Visit Plan check goals for discharge    PT Home Exercise Plan Access Code [email or web address]    Consulted and Agree with Plan of Care Patient             Patient will benefit from skilled therapeutic intervention in order to improve the following deficits and impairments:  Abnormal gait, Decreased balance, Decreased mobility, Decreased strength  Visit Diagnosis: Other abnormalities of gait and mobility  Unsteadiness on feet  Muscle weakness (generalized)     Problem List Patient Active Problem List   Diagnosis Date Noted   Prostate cancer ([place]) 11/16/2018   Malignant neoplasm of prostate ([place]) 09/18/2018   Daytime somnolence 08/04/2017   Dyspnea on exertion 08/04/2017   Exercise intolerance 08/04/2017   Need for influenza vaccination 07/01/2017   Ulnar neuropathy at elbow, 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   Essential hypertension 01/12/2017   Groin strain 11/18/2016   Irregular bowel habits 11/18/2016   Rheumatoid arthritis ([place]) 01/22/2016   Chronic insomnia 12/31/2015   Chronic pain 12/31/2015   Esophageal reflux 12/31/2015   Hypercholesteremia 12/31/2015   Chronic arthritis 12/31/2015   Wrist pain, right 12/01/2015   IT band syndrome 05/06/2014   Cervical radiculopathy 07/14/2011   Arthritis pain, hand 06/02/2011   Osteoarthritis of CMC joint of thumb 06/02/2011    Willow Ora, PTA, [facility] Outpatient Neuro [facility] [address] [place], [place] [address] [phone] 05/01/21, 7:30 PM  Name: Isaiah Price MRN: [id number] Date of Birth: 19-Mar-1951

## 2021-05-04 ENCOUNTER — Ambulatory Visit: Payer: Medicare Other

## 2021-05-05 ENCOUNTER — Ambulatory Visit: Payer: Medicare Other

## 2021-05-06 ENCOUNTER — Ambulatory Visit: Payer: Medicare Other

## 2021-05-07 ENCOUNTER — Ambulatory Visit
Admission: RE | Admit: 2021-05-07 | Discharge: 2021-05-07 | Disposition: A | Discharge status: Home / Self Care | Payer: Medicare Other | Source: Ambulatory Visit | Attending: Radiation Oncology | Admitting: Radiation Oncology

## 2021-05-07 ENCOUNTER — Other Ambulatory Visit: Payer: Self-pay

## 2021-05-07 ENCOUNTER — Ambulatory Visit: Payer: Medicare Other

## 2021-05-07 DIAGNOSIS — Z510 Encounter for antineoplastic radiation therapy: Secondary | ICD-10-CM | POA: Diagnosis not present

## 2021-05-08 ENCOUNTER — Ambulatory Visit: Payer: Medicare Other

## 2021-05-08 ENCOUNTER — Ambulatory Visit: Payer: Medicare Other | Admitting: Urology

## 2021-05-08 ENCOUNTER — Ambulatory Visit
Admission: RE | Admit: 2021-05-08 | Discharge: 2021-05-08 | Disposition: A | Discharge status: Home / Self Care | Payer: Medicare Other | Source: Ambulatory Visit | Attending: Radiation Oncology | Admitting: Radiation Oncology

## 2021-05-08 ENCOUNTER — Ambulatory Visit: Payer: Medicare Other | Admitting: Physical Therapy

## 2021-05-08 ENCOUNTER — Encounter: Payer: Self-pay | Admitting: Physical Therapy

## 2021-05-08 DIAGNOSIS — R2689 Other abnormalities of gait and mobility: Secondary | ICD-10-CM

## 2021-05-08 DIAGNOSIS — R2681 Unsteadiness on feet: Secondary | ICD-10-CM

## 2021-05-08 DIAGNOSIS — M6281 Muscle weakness (generalized): Secondary | ICD-10-CM

## 2021-05-08 DIAGNOSIS — Z510 Encounter for antineoplastic radiation therapy: Secondary | ICD-10-CM | POA: Diagnosis not present

## 2021-05-08 NOTE — Progress Notes (Signed)
Pt here for patient teaching.    Pt given Radiation and You booklet.    Reviewed areas of pertinence such as diarrhea, fatigue, hair loss, nausea and vomiting, sexual and fertility changes, skin changes, and urinary and bladder changes .   Pt able to give teach back of to pat skin, use unscented/gentle soap, use baby wipes, have Imodium on hand, drink plenty of water, and sitz bath,avoid applying anything to skin within 4 hours of treatment.   Pt verbalizes understanding of information given and will contact nursing with any questions or concerns.    [email or web address]

## 2021-05-08 NOTE — Therapy (Signed)
[facility] [address] [place], [place], [address] Phone: [phone]   Fax:  [phone]  Physical Therapy Treatment  Patient Details  Name: Isaiah Price MRN: [id number] Date of Birth: 1951/04/23 Referring Provider (PT): Margette Fast   Encounter Date: 05/08/2021   PT End of Session - 05/08/21 1325     Visit Number 13    Number of Visits 17    Date for PT Re-Evaluation 06/09/21   60 day poc, 90 day cert   Authorization Type Medicare so 10th visit progress note    Progress Note Due on Visit 20    PT Start Time 1318    PT Stop Time 1347   discarge, not all time was needed   PT Time Calculation (min) 29 min    Equipment Utilized During Treatment Gait belt    Activity Tolerance Patient tolerated treatment well    Behavior During Therapy [facility] for tasks assessed/performed             Past Medical History:  Diagnosis Date   Achilles tendinitis, left leg    At risk for sleep apnea    STOP--BANG SCORE= 5  (routed to pt's pcp in epic 11-13-2018)   Chronic insomnia 12/31/2015   Chronic neck pain    Congenital pectus excavatum    per pt had pulmonary test ,  60% restrictive by states asymptomatic   DDD (degenerative disc disease), cervical    Epicondylitis, lateral, right    GERD (gastroesophageal reflux disease)    History of exercise intolerance    09-02-2017  ETT,  negative for ischemia   History of pericarditis 1985   HTN (hypertension)    Hypercholesteremia 12/31/2015   Iritis    IT band syndrome 05/06/2014   Lower urinary tract symptoms (LUTS)    Neuropathy    bilateral arms with tingling and numbness at times due to cervical neck   OA (osteoarthritis)    Polyarthralgia    Prostate cancer [facility]    urologist-  dr Alinda Money--- Stage T1c, Gleason 3+4   Rheumatoid arthritis [facility])    rheumatologist-- dr Amil Amen--- seropositive,multiple sites   Wears glasses     Past Surgical History:  Procedure  Laterality Date   ANTERIOR CERVICAL DECOMP/DISCECTOMY FUSION  39   dr Sherwood Gambler   C5 -- 7  (per pt fusion failed)   CARPAL TUNNEL RELEASE Right done at same time of [place] fusion   CARPOMETACARPAL ([place]) FUSION OF THUMB Bilateral 2015 and 2014  approx.   CATARACT EXTRACTION W/ INTRAOCULAR LENS  IMPLANT, BILATERAL  2013   LYMPHADENECTOMY Bilateral 11/16/2018   Procedure: LYMPHADENECTOMY, PELVIC;  Surgeon: Raynelle Bring, MD;  [facility];  Service: Urology;  Laterality: Bilateral;   ROBOT ASSISTED LAPAROSCOPIC RADICAL PROSTATECTOMY N/A 11/16/2018   Procedure: XI ROBOTIC ASSISTED LAPAROSCOPIC RADICAL PROSTATECTOMY LEVEL 2;  Surgeon: Raynelle Bring, MD;  [facility];  Service: Urology;  Laterality: N/A;   SHOULDER SURGERY Left age 69   TONSILLECTOMY  child    There were no vitals filed for this visit.   Subjective Assessment - 05/08/21 1320     Subjective No new complaints. No falls. Saw Dr. Tamera Punt for arm pain. Was referred to therapy to address this. Has not scheduled as of yet. Was told he could wait a few week before scheduling and work on it at home his own, or go ahead and schedule. Planning to start sooner rather than later.    Pertinent History PMH: rheumatoid  arthritis, chronic neck pain, HTN, neuropathy in arms from neck, C5-7 ACDF, pectus excavatum which does contribute some to SOB and reports his FEV1 was decreased on testing, prostate cancer with history of surgery but just found reoccurance on bloodwork last week. Waiting for CT currently.    Patient Stated Goals Pt wants to not feel lightheaded and feel steadier on feet.    Currently in Pain? Yes    Pain Score 4    4/10 at rest, 6/10 with movements, >6/10 with lifting/pushing with the arm   Pain Location Arm    Pain Orientation Right    Pain Descriptors / Indicators Aching;Sharp;Shooting    Pain Type Acute pain    Pain Onset 1 to 4 weeks ago    Pain Frequency Constant    Aggravating Factors  certain movements    Pain  Relieving Factors ice, tylenol, rest                [facility] PT Assessment - 05/08/21 1325       Functional Gait  Assessment   Gait assessed  Yes   5.03   Gait Level Surface Walks 20 ft in less than 5.5 sec, no assistive devices, good speed, no evidence for imbalance, normal gait pattern, deviates no more than 6 in outside of the 12 in walkway width.    Change in Gait Speed Able to smoothly change walking speed without loss of balance or gait deviation. Deviate no more than 6 in outside of the 12 in walkway width.    Gait with Horizontal Head Turns Performs head turns smoothly with no change in gait. Deviates no more than 6 in outside 12 in walkway width    Gait with Vertical Head Turns Performs head turns with no change in gait. Deviates no more than 6 in outside 12 in walkway width.    Gait and Pivot Turn Pivot turns safely within 3 sec and stops quickly with no loss of balance.    Step Over Obstacle Is able to step over 2 stacked shoe boxes taped together (9 in total height) without changing gait speed. No evidence of imbalance.    Gait with Narrow Base of Support Is able to ambulate for 10 steps heel to toe with no staggering.    Gait with Eyes Closed Walks 20 ft, no assistive devices, good speed, no evidence of imbalance, normal gait pattern, deviates no more than 6 in outside 12 in walkway width. Ambulates 20 ft in less than 7 sec.   6.65   Ambulating Backwards Walks 20 ft, no assistive devices, good speed, no evidence for imbalance, normal gait   8.47   Steps Alternating feet, no rail.    Total Score 30    FGA comment: 30/30                    [facility] Adult PT Treatment/Exercise - 05/08/21 1325       Transfers   Transfers Sit to Stand;Stand to Sit    Sit to Stand 6: Modified independent (Device/Increase time)    Five time sit to stand comments  9.50 sec's no UE support from standard height chair    Stand to Sit 6: Modified independent (Device/Increase time)       Ambulation/Gait   Ambulation/Gait Yes    Ambulation/Gait Assistance 6: Modified independent (Device/Increase time)    Ambulation/Gait Assistance Details no issues noted, no assist needed. no veering with gait. Pt able to maintain pace while engaged in  coversation.    Ambulation Distance (Feet) 1000 Feet   x1 plus around clinic   Assistive device None    Gait Pattern Step-through pattern;Narrow base of support    Ambulation Surface Level;Unlevel;Indoor;Outdoor;Paved    Stairs Yes    Stairs Assistance 6: Modified independent (Device/Increase time)    Stair Management Technique No rails;Alternating pattern;Forwards    Number of Stairs 4   x2   Height of Stairs 6      Self-Care   Self-Care Other Self-Care Comments    Other Self-Care Comments  HEP is going well with no issues. Still challenging.; has not returned to gardening due to right arm pain. has done simple tasks like watering and pruning with left hand only with no balance issues.                      PT Short Term Goals - 04/10/21 1411       PT SHORT TERM GOAL #1   Title Pt will be independent with balance HEP to continue gains at home.    Baseline 04/10/21: met with current program    Status Achieved    Target Date 04/10/21      PT SHORT TERM GOAL #2   Title Pt will ambulate >500' on level surfaces independently with no significant staggering noted.    Baseline 04/10/21: met in session    Time --    Period --    Status Achieved    Target Date 04/10/21      PT SHORT TERM GOAL #3   Title Pt will increase FGA from 19 to >23/30 for improved balance and gait safety.    Baseline 04/10/21: 28/30 scored today    Time --    Period --    Status Achieved    Target Date 04/10/21               PT Long Term Goals - 05/08/21 1659       PT LONG TERM GOAL #1   Title Pt will ambulate up/down 8 steps with reciprocal pattern without rails independently for improved community access. (all LTGs due 05/10/21)     Baseline 05/08/21: met in session today    Status Achieved      PT LONG TERM GOAL #2   Title Pt will decrease 5 x sit to stand from 18.53 sec to <15 sec for improved balance and functional strength.    Baseline 05/08/21: 9.50 sec's no UE support    Time --    Period --    Status Achieved      PT LONG TERM GOAL #3   Title Pt will ambulate >1000' on varied outdoor surfaces mod I with LRAD versus no device for improved community mobility.    Baseline 05/08/21: met in session today    Time --    Period --    Status Achieved      PT LONG TERM GOAL #4   Title Pt will increase FGA to >26/30 for improved balance and gait safety.    Baseline 05/08/21: 30/30 scored today    Status Achieved      PT LONG TERM GOAL #5   Title Pt will be able to perform simulated gardening tasks with bending over without LOB for improved function.    Baseline 05/08/21: has been limited by right forearm pain now, not balance with gardening. Able to bend over and come up with no issues.    Time --  Period --    Status Achieved                   Plan - 05/08/21 1325     Clinical Impression Statement Today's skilled session focused on progress toward LTGs for anticipated discharge. Pt met all goals. His 5 time sit to stand improved to 9.50 sec's no UE support. His FGA improved to 30/30. Pt plans to start with ortho PT soon to address forearm pain after seeing ortho MD yesterday. Pt is in agreement with discharge today.    Personal Factors and Comorbidities Comorbidity 3+    Comorbidities rheumatoid arthritis, chronic neck pain, HTN, neuropathy in arms from neck, C5-7 ACDF    Examination-Activity Limitations Locomotion Level;Transfers;Stairs;Stand    Examination-Participation Restrictions Community Activity;Yard Work    Merchant [employer] officer Evolving/Moderate complexity    Rehab Potential Good    PT Frequency 2x / week   plus eval   PT Duration 8 weeks    PT Treatment/Interventions ADLs/Self  Care Home Management;DME Instruction;Gait training;Stair training;Functional mobility training;Therapeutic activities;Therapeutic exercise;Balance training;Neuromuscular re-education;Manual techniques;Vestibular;Canalith Repostioning    PT Next Visit Plan discharge today    PT Home Exercise Plan Access Code [email or web address]    Consulted and Agree with Plan of Care Patient             Patient will benefit from skilled therapeutic intervention in order to improve the following deficits and impairments:  Abnormal gait, Decreased balance, Decreased mobility, Decreased strength  Visit Diagnosis: Unsteadiness on feet  Other abnormalities of gait and mobility  Muscle weakness (generalized)     Problem List Patient Active Problem List   Diagnosis Date Noted   Prostate cancer ([place]) 11/16/2018   Malignant neoplasm of prostate ([place]) 09/18/2018   Daytime somnolence 08/04/2017   Dyspnea on exertion 08/04/2017   Exercise intolerance 08/04/2017   Need for influenza vaccination 07/01/2017   Ulnar neuropathy at elbow, 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   Essential hypertension 01/12/2017   Groin strain 11/18/2016   Irregular bowel habits 11/18/2016   Rheumatoid arthritis ([place]) 01/22/2016   Chronic insomnia 12/31/2015   Chronic pain 12/31/2015   Esophageal reflux 12/31/2015   Hypercholesteremia 12/31/2015   Chronic arthritis 12/31/2015   Wrist pain, right 12/01/2015   IT band syndrome 05/06/2014   Cervical radiculopathy 07/14/2011   Arthritis pain, hand 06/02/2011   Osteoarthritis of CMC joint of thumb 06/02/2011    Willow Ora, PTA, [facility] Outpatient Neuro [facility] [address], [place], [place] [address] [phone] 05/08/21, 5:06 PM   Name: Isaiah Price MRN: [id number] Date of Birth: 05-14-1951

## 2021-05-11 ENCOUNTER — Ambulatory Visit
Admission: RE | Admit: 2021-05-11 | Discharge: 2021-05-11 | Disposition: A | Discharge status: Home / Self Care | Payer: Medicare Other | Source: Ambulatory Visit | Attending: Radiation Oncology | Admitting: Radiation Oncology

## 2021-05-11 ENCOUNTER — Other Ambulatory Visit: Payer: Self-pay

## 2021-05-11 DIAGNOSIS — Z510 Encounter for antineoplastic radiation therapy: Secondary | ICD-10-CM | POA: Diagnosis not present

## 2021-05-12 ENCOUNTER — Ambulatory Visit
Admission: RE | Admit: 2021-05-12 | Discharge: 2021-05-12 | Disposition: A | Discharge status: Home / Self Care | Payer: Medicare Other | Source: Ambulatory Visit | Attending: Radiation Oncology | Admitting: Radiation Oncology

## 2021-05-12 DIAGNOSIS — Z510 Encounter for antineoplastic radiation therapy: Secondary | ICD-10-CM | POA: Diagnosis not present

## 2021-05-13 ENCOUNTER — Other Ambulatory Visit: Payer: Self-pay

## 2021-05-13 ENCOUNTER — Ambulatory Visit: Admission: RE | Admit: 2021-05-13 | Payer: Medicare Other | Source: Ambulatory Visit

## 2021-05-14 ENCOUNTER — Ambulatory Visit: Payer: Medicare Other

## 2021-05-14 ENCOUNTER — Ambulatory Visit
Admission: RE | Admit: 2021-05-14 | Discharge: 2021-05-14 | Disposition: A | Discharge status: Home / Self Care | Payer: Medicare Other | Source: Ambulatory Visit | Attending: Radiation Oncology | Admitting: Radiation Oncology

## 2021-05-14 DIAGNOSIS — Z510 Encounter for antineoplastic radiation therapy: Secondary | ICD-10-CM | POA: Diagnosis not present

## 2021-05-15 ENCOUNTER — Ambulatory Visit
Admission: RE | Admit: 2021-05-15 | Discharge: 2021-05-15 | Disposition: A | Discharge status: Home / Self Care | Payer: Medicare Other | Source: Ambulatory Visit | Attending: Radiation Oncology | Admitting: Radiation Oncology

## 2021-05-15 ENCOUNTER — Other Ambulatory Visit: Payer: Self-pay

## 2021-05-15 DIAGNOSIS — Z510 Encounter for antineoplastic radiation therapy: Secondary | ICD-10-CM | POA: Diagnosis not present

## 2021-05-18 ENCOUNTER — Other Ambulatory Visit: Payer: Self-pay

## 2021-05-18 ENCOUNTER — Ambulatory Visit
Admission: RE | Admit: 2021-05-18 | Discharge: 2021-05-18 | Disposition: A | Discharge status: Home / Self Care | Payer: Medicare Other | Source: Ambulatory Visit | Attending: Radiation Oncology | Admitting: Radiation Oncology

## 2021-05-18 DIAGNOSIS — C61 Malignant neoplasm of prostate: Secondary | ICD-10-CM | POA: Diagnosis not present

## 2021-05-18 DIAGNOSIS — Z510 Encounter for antineoplastic radiation therapy: Secondary | ICD-10-CM | POA: Insufficient documentation

## 2021-05-18 MED ORDER — TRAMADOL HCL 50 MG PO TABS: ORAL_TABLET | ORAL | 0 refills | Status: DC

## 2021-05-19 ENCOUNTER — Ambulatory Visit
Admission: RE | Admit: 2021-05-19 | Discharge: 2021-05-19 | Disposition: A | Discharge status: Home / Self Care | Payer: Medicare Other | Source: Ambulatory Visit | Attending: Radiation Oncology | Admitting: Radiation Oncology

## 2021-05-19 DIAGNOSIS — Z510 Encounter for antineoplastic radiation therapy: Secondary | ICD-10-CM | POA: Diagnosis not present

## 2021-05-20 ENCOUNTER — Ambulatory Visit
Admission: RE | Admit: 2021-05-20 | Discharge: 2021-05-20 | Disposition: A | Discharge status: Home / Self Care | Payer: Medicare Other | Source: Ambulatory Visit | Attending: Radiation Oncology | Admitting: Radiation Oncology

## 2021-05-20 ENCOUNTER — Other Ambulatory Visit: Payer: Self-pay

## 2021-05-20 DIAGNOSIS — Z510 Encounter for antineoplastic radiation therapy: Secondary | ICD-10-CM | POA: Diagnosis not present

## 2021-05-21 ENCOUNTER — Ambulatory Visit
Admission: RE | Admit: 2021-05-21 | Discharge: 2021-05-21 | Disposition: A | Discharge status: Home / Self Care | Payer: Medicare Other | Source: Ambulatory Visit | Attending: Radiation Oncology | Admitting: Radiation Oncology

## 2021-05-21 DIAGNOSIS — Z510 Encounter for antineoplastic radiation therapy: Secondary | ICD-10-CM | POA: Diagnosis not present

## 2021-05-22 ENCOUNTER — Other Ambulatory Visit: Payer: Self-pay

## 2021-05-22 ENCOUNTER — Ambulatory Visit
Admission: RE | Admit: 2021-05-22 | Discharge: 2021-05-22 | Disposition: A | Discharge status: Home / Self Care | Payer: Medicare Other | Source: Ambulatory Visit | Attending: Radiation Oncology | Admitting: Radiation Oncology

## 2021-05-22 DIAGNOSIS — Z510 Encounter for antineoplastic radiation therapy: Secondary | ICD-10-CM | POA: Diagnosis not present

## 2021-05-25 ENCOUNTER — Ambulatory Visit
Admission: RE | Admit: 2021-05-25 | Discharge: 2021-05-25 | Disposition: A | Discharge status: Home / Self Care | Payer: Medicare Other | Source: Ambulatory Visit | Attending: Radiation Oncology | Admitting: Radiation Oncology

## 2021-05-25 ENCOUNTER — Other Ambulatory Visit: Payer: Self-pay

## 2021-05-25 DIAGNOSIS — Z510 Encounter for antineoplastic radiation therapy: Secondary | ICD-10-CM | POA: Diagnosis not present

## 2021-05-26 ENCOUNTER — Ambulatory Visit
Admission: RE | Admit: 2021-05-26 | Discharge: 2021-05-26 | Disposition: A | Discharge status: Home / Self Care | Payer: Medicare Other | Source: Ambulatory Visit | Attending: Radiation Oncology | Admitting: Radiation Oncology

## 2021-05-26 DIAGNOSIS — Z510 Encounter for antineoplastic radiation therapy: Secondary | ICD-10-CM | POA: Diagnosis not present

## 2021-05-27 ENCOUNTER — Ambulatory Visit
Admission: RE | Admit: 2021-05-27 | Discharge: 2021-05-27 | Disposition: A | Discharge status: Home / Self Care | Payer: Medicare Other | Source: Ambulatory Visit | Attending: Radiation Oncology | Admitting: Radiation Oncology

## 2021-05-27 ENCOUNTER — Other Ambulatory Visit: Payer: Self-pay

## 2021-05-27 DIAGNOSIS — Z510 Encounter for antineoplastic radiation therapy: Secondary | ICD-10-CM | POA: Diagnosis not present

## 2021-05-28 ENCOUNTER — Ambulatory Visit
Admission: RE | Admit: 2021-05-28 | Discharge: 2021-05-28 | Disposition: A | Discharge status: Home / Self Care | Payer: Medicare Other | Source: Ambulatory Visit | Attending: Radiation Oncology | Admitting: Radiation Oncology

## 2021-05-28 DIAGNOSIS — Z510 Encounter for antineoplastic radiation therapy: Secondary | ICD-10-CM | POA: Diagnosis not present

## 2021-05-29 ENCOUNTER — Ambulatory Visit
Admission: RE | Admit: 2021-05-29 | Discharge: 2021-05-29 | Disposition: A | Discharge status: Home / Self Care | Payer: Medicare Other | Source: Ambulatory Visit | Attending: Radiation Oncology | Admitting: Radiation Oncology

## 2021-05-29 DIAGNOSIS — Z510 Encounter for antineoplastic radiation therapy: Secondary | ICD-10-CM | POA: Diagnosis not present

## 2021-06-01 ENCOUNTER — Ambulatory Visit: Payer: Medicare Other | Admitting: Neurology

## 2021-06-01 ENCOUNTER — Ambulatory Visit
Admission: RE | Admit: 2021-06-01 | Discharge: 2021-06-01 | Disposition: A | Discharge status: Home / Self Care | Payer: Medicare Other | Source: Ambulatory Visit | Attending: Radiation Oncology | Admitting: Radiation Oncology

## 2021-06-01 ENCOUNTER — Other Ambulatory Visit: Payer: Self-pay

## 2021-06-01 DIAGNOSIS — Z510 Encounter for antineoplastic radiation therapy: Secondary | ICD-10-CM | POA: Diagnosis not present

## 2021-06-02 ENCOUNTER — Ambulatory Visit
Admission: RE | Admit: 2021-06-02 | Discharge: 2021-06-02 | Disposition: A | Discharge status: Home / Self Care | Payer: Medicare Other | Source: Ambulatory Visit | Attending: Radiation Oncology | Admitting: Radiation Oncology

## 2021-06-02 DIAGNOSIS — Z510 Encounter for antineoplastic radiation therapy: Secondary | ICD-10-CM | POA: Diagnosis not present

## 2021-06-03 ENCOUNTER — Other Ambulatory Visit: Payer: Self-pay

## 2021-06-03 ENCOUNTER — Ambulatory Visit
Admission: RE | Admit: 2021-06-03 | Discharge: 2021-06-03 | Disposition: A | Discharge status: Home / Self Care | Payer: Medicare Other | Source: Ambulatory Visit | Attending: Radiation Oncology | Admitting: Radiation Oncology

## 2021-06-03 DIAGNOSIS — Z510 Encounter for antineoplastic radiation therapy: Secondary | ICD-10-CM | POA: Diagnosis not present

## 2021-06-04 ENCOUNTER — Ambulatory Visit: Payer: Medicare Other

## 2021-06-04 ENCOUNTER — Ambulatory Visit
Admission: RE | Admit: 2021-06-04 | Discharge: 2021-06-04 | Disposition: A | Discharge status: Home / Self Care | Payer: Medicare Other | Source: Ambulatory Visit | Attending: Radiation Oncology | Admitting: Radiation Oncology

## 2021-06-04 DIAGNOSIS — Z510 Encounter for antineoplastic radiation therapy: Secondary | ICD-10-CM | POA: Diagnosis not present

## 2021-06-05 ENCOUNTER — Ambulatory Visit
Admission: RE | Admit: 2021-06-05 | Discharge: 2021-06-05 | Disposition: A | Discharge status: Home / Self Care | Payer: Medicare Other | Source: Ambulatory Visit | Attending: Radiation Oncology | Admitting: Radiation Oncology

## 2021-06-05 ENCOUNTER — Ambulatory Visit: Payer: Medicare Other

## 2021-06-05 ENCOUNTER — Other Ambulatory Visit: Payer: Self-pay

## 2021-06-05 DIAGNOSIS — Z510 Encounter for antineoplastic radiation therapy: Secondary | ICD-10-CM | POA: Diagnosis not present

## 2021-06-08 ENCOUNTER — Other Ambulatory Visit: Payer: Self-pay

## 2021-06-08 ENCOUNTER — Ambulatory Visit: Payer: Medicare Other

## 2021-06-08 ENCOUNTER — Encounter: Payer: Self-pay | Admitting: Neurology

## 2021-06-08 ENCOUNTER — Ambulatory Visit (INDEPENDENT_AMBULATORY_CARE_PROVIDER_SITE_OTHER): Payer: Medicare Other | Admitting: Neurology

## 2021-06-08 VITALS — BP 116/80 | HR 84 | Ht 76.0 in | Wt 202.8 lb

## 2021-06-08 DIAGNOSIS — R269 Unspecified abnormalities of gait and mobility: Secondary | ICD-10-CM | POA: Diagnosis not present

## 2021-06-08 DIAGNOSIS — G4486 Cervicogenic headache: Secondary | ICD-10-CM | POA: Diagnosis not present

## 2021-06-08 DIAGNOSIS — Z510 Encounter for antineoplastic radiation therapy: Secondary | ICD-10-CM | POA: Diagnosis not present

## 2021-06-08 DIAGNOSIS — F5104 Psychophysiologic insomnia: Secondary | ICD-10-CM

## 2021-06-08 HISTORY — DX: Unspecified abnormalities of gait and mobility: R26.9

## 2021-06-08 HISTORY — DX: Cervicogenic headache: G44.86

## 2021-06-08 MED ORDER — NORTRIPTYLINE HCL 10 MG PO CAPS: ORAL_CAPSULE | ORAL | 3 refills | Status: DC

## 2021-06-08 NOTE — Progress Notes (Signed)
Reason for visit: Gait disorder, cervicogenic headache  Isaiah Price is an 70 y.o. male  History of present illness:  Isaiah Price is a 70 year old right-handed white male with a history of rheumatoid arthritis and a history of prostate cancer.  The patient in the past has had a radical prostatectomy and is now being treated for recurrence of cancer.  He has been placed on medications to block testosterone production and he is now getting external beam radiation.  The patient has been seen through Dr. Thornell Mule from ENT for a vestibulopathy.  The patient has undergone MRI of the brain and EMG and nerve conduction study evaluation, no source of gait instability was found.  The patient has had blood work as well that was unremarkable.  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  The patient particularly notes that he feels unsteady if he tilts his head backwards.  He has a sensation that he will go backwards at times, he has not had any falls since last seen.  If he closes eyes and tries to walk down the hall, he will always veer to the right.  He has undergone physical therapy with good benefit, but after the physical therapy sessions ended, his walking problems seemed to have worsened.  He has not been able to keep up with the exercises given to him as he is undergoing treatment for his prostate cancer.  He also reports chronic issues with neck pain, he has had surgery on the neck previously.  The patient has cervicogenic headaches coming up from the back of the head occasionally associated with sharp shooting pains.  He has Flexeril to take but he does not take this frequently, he is not on Ultram frequently.  He returns to this office for further evaluation.  Past Medical History:  Diagnosis Date   Achilles tendinitis, left leg    At risk for sleep apnea    STOP--BANG SCORE= 5  (routed to pt's pcp in epic 11-13-2018)   Chronic insomnia 12/31/2015    Chronic neck pain    Congenital pectus excavatum    per pt had pulmonary test ,  60% restrictive by states asymptomatic   DDD (degenerative disc disease), cervical    Epicondylitis, lateral, right    GERD (gastroesophageal reflux disease)    History of exercise intolerance    09-02-2017  ETT,  negative for ischemia   History of pericarditis 1985   HTN (hypertension)    Hypercholesteremia 12/31/2015   Iritis    IT band syndrome 05/06/2014   Lower urinary tract symptoms (LUTS)    Neuropathy    bilateral arms with tingling and numbness at times due to cervical neck   OA (osteoarthritis)    Polyarthralgia    Prostate cancer [facility])    urologist-  dr Alinda Money--- Stage T1c, Gleason 3+4   Rheumatoid arthritis [facility])    rheumatologist-- dr Amil Amen--- seropositive,multiple sites   Wears glasses     Past Surgical History:  Procedure Laterality Date   ANTERIOR CERVICAL DECOMP/DISCECTOMY FUSION  29   dr Sherwood Gambler   C5 -- 7  (per pt fusion failed)   CARPAL TUNNEL RELEASE Right done at same time of [place] fusion   CARPOMETACARPAL ([place]) FUSION OF THUMB Bilateral 2015 and 2014  approx.   CATARACT EXTRACTION W/ INTRAOCULAR LENS  IMPLANT, BILATERAL  2013   LYMPHADENECTOMY Bilateral 11/16/2018   Procedure: LYMPHADENECTOMY, PELVIC;  Surgeon: Raynelle Bring, MD;  Location:  [facility];  Service: Urology;  Laterality: Bilateral;   ROBOT ASSISTED LAPAROSCOPIC RADICAL PROSTATECTOMY N/A 11/16/2018   Procedure: XI ROBOTIC ASSISTED LAPAROSCOPIC RADICAL PROSTATECTOMY LEVEL 2;  Surgeon: Raynelle Bring, MD;  Location: [facility];  Service: Urology;  Laterality: N/A;   SHOULDER SURGERY Left age 32   TONSILLECTOMY  child    Family History  Problem Relation Age of Onset   Heart disease Father        Aortic Valve Stenosis, CABG 97   Hypertension Father    Macular degeneration Mother    Heart disease Mother        MVR   Hypertension Mother    Osteosarcoma Paternal Uncle    Osteosarcoma Son     Social history:  reports  that he quit smoking about 9 years ago. His smoking use included cigarettes. He has a 25.00 pack-year smoking history. He has never used smokeless tobacco. He reports current alcohol use. He reports that he does not use drugs.    Allergies  Allergen Reactions   Nsaids Other (See Comments)    Causes GI bleeds (numerous times)   Brimonidine Itching and Other (See Comments)    redness Abdominal Pain   Gabapentin Other (See Comments)    Couldn't talk   Meloxicam Other (See Comments)    Medications:  Prior to Admission medications   Medication Sig Start Date End Date Taking? Authorizing Provider  allopurinol (ZYLOPRIM) 100 MG tablet Take 200 mg by mouth daily. 05/15/19   [provider]  ALPRAZolam Duanne Moron) 0.5 MG tablet Take 2 tablets approximately 45 minutes prior to the MRI study, take a third tablet if needed. 01/02/21   Kathrynn Ducking, MD  atorvastatin (LIPITOR) 40 MG tablet Take 40 mg by mouth at bedtime.  06/29/18   [provider]  diltiazem (CARDIZEM) 90 MG tablet Take 90 mg by mouth at bedtime.  07/06/16   [provider]  fluticasone (FLONASE) 50 MCG/ACT nasal spray Place 1 spray into the nose daily as needed for allergies.     [provider]  folic acid (FOLVITE) 1 MG tablet Take 1 mg by mouth daily. 10/13/20   [provider]  HYDROcodone-acetaminophen (NORCO) 10-325 MG tablet TAKE 1 TABLET EVERY 6 HOURS AS NEEDED FOR MODERATE TO SEVERE PAIN. 04/23/21   Lilia Argue R, DO  inFLIXimab-dyyb (INFLECTRA) 100 MG SOLR Inject into the vein.    [provider]  methotrexate 2.5 MG tablet Take 5 tabs Tues only 11/28/20   [provider]  omeprazole (PRILOSEC) 40 MG capsule Take 40 mg by mouth daily.  01/12/17   [provider]  Polyvinyl Alcohol-Povidone (REFRESH OP) Place 1 drop into both eyes 2 (two) times daily as needed (dry eyes).     [provider]  predniSONE (DELTASONE) 5 MG tablet Take 5 mg by mouth  daily with breakfast.    [provider]  traMADol (ULTRAM) 50 MG tablet TAKE (1) TABLET EVERY EIGHT HOURS AS NEEDED FOR PAIN. 05/18/21   Thurman Coyer, DO  tretinoin (RETIN-A) 0.05 % cream Apply a thin layer to face nightly 10/02/19   [provider]  zolpidem (AMBIEN) 10 MG tablet Take 10 mg by mouth at bedtime.  01/12/17   [provider]    ROS:  Out of a complete 14 system review of symptoms, the patient complains only of the following symptoms, and all other reviewed systems are negative.  Neck pain Headache Walking difficulty Urinary frequency Chronic fatigue, insomnia  Height _0  (1.93 m), weight 202 lb 12.8 oz (92 kg).  Physical Exam  General: The patient is alert and cooperative at the time of the examination.  Skin: No significant peripheral edema is noted.   Neurologic Exam  Mental status: The patient is alert and oriented x 3 at the time of the examination. The patient has apparent normal recent and remote memory, with an apparently normal attention span and concentration ability.   Cranial nerves: Facial symmetry is present. Speech is normal, no aphasia or dysarthria is noted. Extraocular movements are full. Visual fields are full.  Motor: The patient has good strength in all 4 extremities.  Sensory examination: Soft touch sensation is symmetric on the face, arms, and legs.  Coordination: The patient has good finger-nose-finger and heel-to-shin bilaterally.  Gait and station: The patient has a normal gait. Tandem gait is slightly unsteady. Romberg is negative, but is slightly unsteady. No drift is seen.  Reflexes: Deep tendon reflexes are symmetric, but the ankle jerk reflexes are somewhat depressed.   MRI brain 01/08/21:  IMPRESSION:    Unremarkable MRI brain without contrast.  No acute findings.  * MRI scan images were reviewed online. I agree with the written report.    Assessment/Plan:  1.  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  Cervicogenic headache  3.  Rheumatoid arthritis  4.  Prostate cancer  5.  Chronic insomnia, chronic fatigue  The patient could potentially benefit from another session of physical therapy in the future once he completes his treatment for prostate cancer.  He probably should get involved with a program such as tai chi that will work on balance issues in the future.  He has chronic neck pain and cervicogenic headache, I will add nortriptyline at night for this issue and hopefully to help him sleep at night and reduce some of his urinary urgency sensations.  The patient will call for any dose adjustments.  He will follow-up here in 4 months, in the future he can follow-up with Dr. Krista Blue who also treats his wife.  Jill Alexanders MD 06/08/2021 7:26 AM  [facility] [address] [place], [place] [address]  Phone [phone] Fax [phone]

## 2021-06-09 ENCOUNTER — Ambulatory Visit
Admission: RE | Admit: 2021-06-09 | Discharge: 2021-06-09 | Disposition: A | Discharge status: Home / Self Care | Payer: Medicare Other | Source: Ambulatory Visit | Attending: Radiation Oncology | Admitting: Radiation Oncology

## 2021-06-09 DIAGNOSIS — Z510 Encounter for antineoplastic radiation therapy: Secondary | ICD-10-CM | POA: Diagnosis not present

## 2021-06-10 ENCOUNTER — Other Ambulatory Visit: Payer: Self-pay

## 2021-06-10 ENCOUNTER — Ambulatory Visit
Admission: RE | Admit: 2021-06-10 | Discharge: 2021-06-10 | Disposition: A | Discharge status: Home / Self Care | Payer: Medicare Other | Source: Ambulatory Visit | Attending: Radiation Oncology | Admitting: Radiation Oncology

## 2021-06-10 DIAGNOSIS — Z510 Encounter for antineoplastic radiation therapy: Secondary | ICD-10-CM | POA: Diagnosis not present

## 2021-06-11 ENCOUNTER — Ambulatory Visit: Payer: Medicare Other

## 2021-06-11 DIAGNOSIS — Z510 Encounter for antineoplastic radiation therapy: Secondary | ICD-10-CM | POA: Diagnosis not present

## 2021-06-12 ENCOUNTER — Other Ambulatory Visit: Payer: Self-pay

## 2021-06-12 ENCOUNTER — Ambulatory Visit
Admission: RE | Admit: 2021-06-12 | Discharge: 2021-06-12 | Disposition: A | Discharge status: Home / Self Care | Payer: Medicare Other | Source: Ambulatory Visit | Attending: Radiation Oncology | Admitting: Radiation Oncology

## 2021-06-12 DIAGNOSIS — Z510 Encounter for antineoplastic radiation therapy: Secondary | ICD-10-CM | POA: Diagnosis not present

## 2021-06-15 ENCOUNTER — Other Ambulatory Visit: Payer: Self-pay

## 2021-06-15 ENCOUNTER — Ambulatory Visit
Admission: RE | Admit: 2021-06-15 | Discharge: 2021-06-15 | Disposition: A | Discharge status: Home / Self Care | Payer: Medicare Other | Source: Ambulatory Visit | Attending: Radiation Oncology | Admitting: Radiation Oncology

## 2021-06-15 DIAGNOSIS — Z510 Encounter for antineoplastic radiation therapy: Secondary | ICD-10-CM | POA: Diagnosis not present

## 2021-06-16 ENCOUNTER — Ambulatory Visit
Admission: RE | Admit: 2021-06-16 | Discharge: 2021-06-16 | Disposition: A | Discharge status: Home / Self Care | Payer: Medicare Other | Source: Ambulatory Visit | Attending: Radiation Oncology | Admitting: Radiation Oncology

## 2021-06-16 DIAGNOSIS — Z510 Encounter for antineoplastic radiation therapy: Secondary | ICD-10-CM | POA: Diagnosis not present

## 2021-06-17 ENCOUNTER — Other Ambulatory Visit: Payer: Self-pay

## 2021-06-17 ENCOUNTER — Ambulatory Visit
Admission: RE | Admit: 2021-06-17 | Discharge: 2021-06-17 | Disposition: A | Discharge status: Home / Self Care | Payer: Medicare Other | Source: Ambulatory Visit | Attending: Radiation Oncology | Admitting: Radiation Oncology

## 2021-06-17 DIAGNOSIS — Z510 Encounter for antineoplastic radiation therapy: Secondary | ICD-10-CM | POA: Diagnosis not present

## 2021-06-18 ENCOUNTER — Ambulatory Visit
Admission: RE | Admit: 2021-06-18 | Discharge: 2021-06-18 | Disposition: A | Discharge status: Home / Self Care | Payer: Medicare Other | Source: Ambulatory Visit | Attending: Radiation Oncology | Admitting: Radiation Oncology

## 2021-06-18 DIAGNOSIS — Z510 Encounter for antineoplastic radiation therapy: Secondary | ICD-10-CM | POA: Insufficient documentation

## 2021-06-18 DIAGNOSIS — C61 Malignant neoplasm of prostate: Secondary | ICD-10-CM | POA: Insufficient documentation

## 2021-06-19 ENCOUNTER — Ambulatory Visit
Admission: RE | Admit: 2021-06-19 | Discharge: 2021-06-19 | Disposition: A | Discharge status: Home / Self Care | Payer: Medicare Other | Source: Ambulatory Visit | Attending: Radiation Oncology | Admitting: Radiation Oncology

## 2021-06-19 DIAGNOSIS — Z510 Encounter for antineoplastic radiation therapy: Secondary | ICD-10-CM | POA: Diagnosis not present

## 2021-06-23 ENCOUNTER — Other Ambulatory Visit: Payer: Self-pay

## 2021-06-23 ENCOUNTER — Ambulatory Visit
Admission: RE | Admit: 2021-06-23 | Discharge: 2021-06-23 | Disposition: A | Discharge status: Home / Self Care | Payer: Medicare Other | Source: Ambulatory Visit | Attending: Radiation Oncology | Admitting: Radiation Oncology

## 2021-06-23 DIAGNOSIS — Z510 Encounter for antineoplastic radiation therapy: Secondary | ICD-10-CM | POA: Diagnosis not present

## 2021-06-24 ENCOUNTER — Ambulatory Visit
Admission: RE | Admit: 2021-06-24 | Discharge: 2021-06-24 | Disposition: A | Discharge status: Home / Self Care | Payer: Medicare Other | Source: Ambulatory Visit | Attending: Radiation Oncology | Admitting: Radiation Oncology

## 2021-06-24 DIAGNOSIS — Z510 Encounter for antineoplastic radiation therapy: Secondary | ICD-10-CM | POA: Diagnosis not present

## 2021-06-25 ENCOUNTER — Ambulatory Visit: Payer: Medicare Other

## 2021-06-25 ENCOUNTER — Other Ambulatory Visit: Payer: Self-pay

## 2021-06-25 ENCOUNTER — Ambulatory Visit
Admission: RE | Admit: 2021-06-25 | Discharge: 2021-06-25 | Disposition: A | Discharge status: Home / Self Care | Payer: Medicare Other | Source: Ambulatory Visit | Attending: Radiation Oncology | Admitting: Radiation Oncology

## 2021-06-25 DIAGNOSIS — Z510 Encounter for antineoplastic radiation therapy: Secondary | ICD-10-CM | POA: Diagnosis not present

## 2021-06-26 ENCOUNTER — Ambulatory Visit
Admission: RE | Admit: 2021-06-26 | Discharge: 2021-06-26 | Disposition: A | Discharge status: Home / Self Care | Payer: Medicare Other | Source: Ambulatory Visit | Attending: Radiation Oncology | Admitting: Radiation Oncology

## 2021-06-26 DIAGNOSIS — Z510 Encounter for antineoplastic radiation therapy: Secondary | ICD-10-CM | POA: Diagnosis not present

## 2021-06-29 ENCOUNTER — Ambulatory Visit
Admission: RE | Admit: 2021-06-29 | Discharge: 2021-06-29 | Disposition: A | Discharge status: Home / Self Care | Payer: Medicare Other | Source: Ambulatory Visit | Attending: Radiation Oncology | Admitting: Radiation Oncology

## 2021-06-29 ENCOUNTER — Other Ambulatory Visit: Payer: Self-pay

## 2021-06-29 DIAGNOSIS — Z510 Encounter for antineoplastic radiation therapy: Secondary | ICD-10-CM | POA: Diagnosis not present

## 2021-06-30 ENCOUNTER — Ambulatory Visit
Admission: RE | Admit: 2021-06-30 | Discharge: 2021-06-30 | Disposition: A | Discharge status: Home / Self Care | Payer: Medicare Other | Source: Ambulatory Visit | Attending: Radiation Oncology | Admitting: Radiation Oncology

## 2021-06-30 DIAGNOSIS — Z510 Encounter for antineoplastic radiation therapy: Secondary | ICD-10-CM | POA: Diagnosis not present

## 2021-07-01 ENCOUNTER — Encounter: Payer: Self-pay | Admitting: Urology

## 2021-07-01 ENCOUNTER — Other Ambulatory Visit: Payer: Self-pay

## 2021-07-01 ENCOUNTER — Ambulatory Visit
Admission: RE | Admit: 2021-07-01 | Discharge: 2021-07-01 | Disposition: A | Discharge status: Home / Self Care | Payer: Medicare Other | Source: Ambulatory Visit | Attending: Radiation Oncology | Admitting: Radiation Oncology

## 2021-07-01 DIAGNOSIS — C61 Malignant neoplasm of prostate: Secondary | ICD-10-CM

## 2021-07-01 DIAGNOSIS — Z510 Encounter for antineoplastic radiation therapy: Secondary | ICD-10-CM | POA: Diagnosis not present

## 2021-07-08 ENCOUNTER — Other Ambulatory Visit: Payer: Self-pay

## 2021-07-09 ENCOUNTER — Other Ambulatory Visit: Payer: Self-pay | Admitting: Sports Medicine

## 2021-07-09 MED ORDER — HYDROCODONE-ACETAMINOPHEN 10-325 MG PO TABS: ORAL_TABLET | ORAL | 0 refills | Status: DC

## 2021-08-05 ENCOUNTER — Encounter: Payer: Self-pay | Admitting: Urology

## 2021-08-05 NOTE — Progress Notes (Signed)
Patient's spouse Thurl Boen cleared to speak w/ me. Mrs. Smotherman reports patient is doing well. No symptoms reported at this time.  IPSS Score of 1 (mild).  No urinary management medications at this time. Urology follow-up scheduled for January 20th, 2023.   Meaningful use complete.  Mrs. Foulks notified of Mr. Grau 8:30am-08/06/21 telephone appointment and verbalized understanding.

## 2021-08-06 ENCOUNTER — Ambulatory Visit: Payer: Medicare Other | Admitting: Radiation Oncology

## 2021-08-06 ENCOUNTER — Ambulatory Visit
Admission: RE | Admit: 2021-08-06 | Discharge: 2021-08-06 | Disposition: A | Discharge status: Home / Self Care | Payer: Medicare Other | Source: Ambulatory Visit | Attending: Urology | Admitting: Urology

## 2021-08-06 DIAGNOSIS — C61 Malignant neoplasm of prostate: Secondary | ICD-10-CM

## 2021-08-06 NOTE — Progress Notes (Signed)
  Radiation Oncology         [phone] ________________________________  Name: Isaiah Price MRN: [id number]  Date: 07/01/2021  DOB: 02/27/1951  End of Treatment Note  Diagnosis:   70 y.o. 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     Indication for treatment:  Curative, Definitive Radiotherapy       Radiation treatment dates:   05/07/21 - 07/01/21 (concurrent with ADT- 70-month Eligard injection 04/09/2021)  Site/dose:  1. The prostate fossa and pelvic lymph nodes were initially treated to 45 Gy in 25 fractions of 1.8 Gy  2. The prostate fossa only was boosted to 68.4 Gy with 13 additional fractions of 1.8 Gy. During the boost, the positive left pelvic node was treated to 26 Gy in 13 fractions of 2 Gy using simultaneous integrated boost (SIB).  Beams/energy:  1. The prostate fossa  and pelvic lymph nodes were initially treated using VMAT intensity modulated radiotherapy delivering 6 megavolt photons. Image guidance was performed with CB-CT studies prior to each fraction. He was immobilized with a body fix lower extremity mold.  2. The prostate fossa and involved left pelvic node were boosted using VMAT intensity modulated radiotherapy delivering 6 megavolt photons. Image guidance was performed with CB-CT studies prior to each fraction. He was immobilized with a body fix lower extremity mold.  Narrative: The patient tolerated radiation treatment relatively well with only minor urinary irritation and modest fatigue.  He reports pain and increased frequency but denied urgency or straining to void.  He also experienced intermittent diarrhea and constipation but did not require any medical management.  Plan: The patient has completed radiation treatment. He will return to radiation oncology clinic for routine followup in one  month. I advised him to call or return sooner if he has any questions or concerns related to his recovery or treatment. ________________________________  Sheral Apley. Tammi Klippel, M.D.

## 2021-08-06 NOTE — Progress Notes (Signed)
Radiation Oncology         [phone] ________________________________  Name: Isaiah Price MRN: [id number]  Date: 08/06/2021  DOB: [birth year]  Post Treatment Note  CC: [name], MD  Raynelle Bring, MD  Diagnosis:    70 y.o. 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     Interval Since Last Radiation:  5 weeks  05/07/21 - 07/01/21 (concurrent with ADT- 69-month Eligard injection 04/09/2021): 1. The prostate fossa and pelvic lymph nodes were initially treated to 45 Gy in 25 fractions of 1.8 Gy  2. The prostate fossa only was boosted to 68.4 Gy with 13 additional fractions of 1.8 Gy. During the boost, the positive left pelvic node was treated to 26 Gy in 13 fractions of 2 Gy using simultaneous integrated boost (SIB).   Narrative: I spoke with the patient to conduct his routine scheduled 1 month follow up visit via telephone to spare the patient unnecessary potential exposure in the healthcare setting during the current COVID-19 pandemic.  The patient was notified in advance and gave permission to proceed with this visit format.  He tolerated radiation treatment relatively well with only minor urinary irritation and modest fatigue.  He reports pain and increased frequency but denied urgency or straining to void.  He also experienced intermittent diarrhea and constipation but did not require any medical management.                              On review of systems, the patient states that he is doing well in general.  He has continued with increased urgency, frequency and occasional, small dribble/leak of urine so he has resumed using a male guard for protection daily.  He denies any dysuria, gross hematuria, incomplete bladder emptying or full volume incontinence.  He denies abdominal pain but has continued with occasional  constipation and has been using Metamucil as needed.  He reports a healthy appetite and is maintaining his weight.  He continues with significant fatigue and bothersome hot flashes associated with his ADT but overall, is pleased with his progress to date.  He had a recent PSA lab drawn on 07/30/2021 that was undetectable.  ALLERGIES:  is allergic to nsaids, brimonidine, gabapentin, meloxicam, promethazine hcl, and trazodone.  Meds: Current Outpatient Medications  Medication Sig Dispense Refill   allopurinol (ZYLOPRIM) 100 MG tablet Take 200 mg by mouth daily.     atorvastatin (LIPITOR) 40 MG tablet Take 40 mg by mouth at bedtime.      cyclobenzaprine (FLEXERIL) 5 MG tablet Take 5 mg by mouth at bedtime as needed.     diltiazem (CARDIZEM) 90 MG tablet Take 90 mg by mouth at bedtime.      fluticasone (FLONASE) 50 MCG/ACT nasal spray Place 1 spray into the nose daily as needed for allergies.      folic acid (FOLVITE) 1 MG tablet Take 1 mg by mouth daily.     HYDROcodone-acetaminophen (NORCO) 10-325 MG tablet TAKE 1 TABLET EVERY 6 HOURS AS NEEDED FOR MODERATE TO SEVERE PAIN. 20 tablet 0   inFLIXimab-dyyb (INFLECTRA) 100 MG SOLR Inject into the vein.     nortriptyline (PAMELOR) 10 MG capsule Take one capsule at night for one week, then take 2 capsules at night for one week, then take 3  capsules at night 90 capsule 3   omeprazole (PRILOSEC) 40 MG capsule Take 40 mg by mouth daily.      Polyvinyl Alcohol-Povidone (REFRESH OP) Place 1 drop into both eyes 2 (two) times daily as needed (dry eyes).      predniSONE (DELTASONE) 5 MG tablet Take 5 mg by mouth daily with breakfast.     traMADol (ULTRAM) 50 MG tablet TAKE (1) TABLET EVERY EIGHT HOURS AS NEEDED FOR PAIN. 60 tablet 0   tretinoin (RETIN-A) 0.05 % cream Apply a thin layer to face nightly     zolpidem (AMBIEN) 10 MG tablet Take 10 mg by mouth at bedtime.      No current facility-administered medications for this encounter.    Facility-Administered Medications Ordered in Other Encounters  Medication Dose Route Frequency Provider Last Rate Last Admin   hydrocortisone sodium succinate (SOLU-CORTEF) injection 100 mg  100 mg Intravenous Once Raynelle Bring, MD        Physical Findings:  vitals were not taken for this visit.  Pain Assessment Pain Score: 0-No pain/10 Unable to assess due to telephone follow-up visit format.  Lab Findings: Lab Results  Component Value Date   WBC 7.7 11/13/2018   HGB 12.8 (L) 11/17/2018   HCT 39.1 11/17/2018   MCV 88.9 11/13/2018   PLT 245 11/13/2018     Radiographic Findings: No results found.  Impression/Plan: 1.  70 y.o. 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    He will continue to follow up with urology for ongoing PSA determinations and has an appointment scheduled with Dr. Alinda Money on 11/06/2021 with a lab visit on 10/30/2021. He understands what to expect with regards to PSA monitoring going forward. I will look forward to following his response to treatment via correspondence with urology, and would be happy to continue to participate in his care if clinically indicated.  We expect that his LUTS will continue to improve over the next 2 to 6 months and he is encouraged by his progress to date.  I encouraged him to call or return to the office if he has any questions regarding his previous radiation or possible radiation side effects. He was comfortable with this plan and will follow up as needed.     Nicholos Johns, PA-C

## 2021-08-13 ENCOUNTER — Other Ambulatory Visit: Payer: Self-pay | Admitting: Sports Medicine

## 2021-08-13 ENCOUNTER — Other Ambulatory Visit: Payer: Self-pay

## 2021-08-13 MED ORDER — TRAMADOL HCL 50 MG PO TABS: ORAL_TABLET | ORAL | 0 refills | Status: DC

## 2021-08-20 ENCOUNTER — Other Ambulatory Visit: Payer: Self-pay | Admitting: Family Medicine

## 2021-08-20 DIAGNOSIS — R911 Solitary pulmonary nodule: Secondary | ICD-10-CM

## 2021-08-21 ENCOUNTER — Telehealth: Payer: Self-pay

## 2021-08-21 NOTE — Telephone Encounter (Signed)
REFERRAL SCANNED TO REFERRAL...Marland KitchenNOTES IN CARE EVERWHERE

## 2021-09-07 ENCOUNTER — Other Ambulatory Visit: Payer: Self-pay

## 2021-09-07 ENCOUNTER — Ambulatory Visit
Admission: RE | Admit: 2021-09-07 | Discharge: 2021-09-07 | Disposition: A | Discharge status: Home / Self Care | Payer: Medicare Other | Source: Ambulatory Visit | Attending: Family Medicine | Admitting: Family Medicine

## 2021-09-07 DIAGNOSIS — R911 Solitary pulmonary nodule: Secondary | ICD-10-CM

## 2021-09-27 DIAGNOSIS — R002 Palpitations: Secondary | ICD-10-CM | POA: Insufficient documentation

## 2021-09-27 NOTE — Progress Notes (Signed)
Cardiology Office Note   Date:  09/29/2021   ID:  Isaiah Price, DOB 11-Jun-1951, MRN [id number]  PCP:  [name], MD  Cardiologist:   None Referring:  [name], MD  Chief Complaint  Patient presents with   Shortness of Breath       History of Present Illness: Isaiah Price is a 70 y.o. male who is referred by [name], MD for evaluation of SOB and tachycardia.  I saw him in 2018.  He had some shortness of breath but had a negative POET ([place] Exercise Treadmill).  He was only able to walk 6 minutes.  He says he has chronic shortness of breath.  He said this is related to pectus excavated him and he knows that he has an abnormal FEV1.  He says he actually does pretty well and denies PND or orthopnea.  He does not have any palpitations, presyncope or syncope.  He does have shortness of breath if he bends over but he can do other physical activities such as pushing a lawnmower without bringing this on.   Of note he was being followed with a PET scan for lung nodule and was noted to have some aortic and coronary atherosclerosis.  He was also found to have enlarged aorta.  6 months later he had a follow-up CT last month and I looked at this.  The aorta was 44 mm.  He has aortic atherosclerosis though there is no mention of coronary atherosclerosis.  A pulmonary nodule previously identified was not visualized.    Past Medical History:  Diagnosis Date   Achilles tendinitis, left leg    At risk for sleep apnea    STOP--BANG SCORE= 5  (routed to pt's pcp in epic 11-13-2018)   Cervicogenic headache 06/08/2021   Chronic insomnia 12/31/2015   Chronic neck pain    Congenital pectus excavatum    per pt had pulmonary test ,  60% restrictive by states asymptomatic   DDD (degenerative disc disease), cervical    Epicondylitis, lateral, right    Gait abnormality 06/08/2021   GERD (gastroesophageal reflux disease)    History of exercise intolerance    09-02-2017  ETT,   negative for ischemia   History of pericarditis 1985   HTN (hypertension)    Hypercholesteremia 12/31/2015   Iritis    IT band syndrome 05/06/2014   Lower urinary tract symptoms (LUTS)    Neuropathy    bilateral arms with tingling and numbness at times due to cervical neck   OA (osteoarthritis)    Polyarthralgia    Prostate cancer [facility])    urologist-  dr Alinda Money--- Stage T1c, Gleason 3+4, 06/08/21 recurrent prostate cancer   Rheumatoid arthritis [facility])    rheumatologist-- dr Amil Amen--- seropositive,multiple sites   Tendon disorder 2022   torn tendon, right arm   Wears glasses     Past Surgical History:  Procedure Laterality Date   ANTERIOR CERVICAL DECOMP/DISCECTOMY FUSION  1991   dr Sherwood Gambler   C5 -- 7  (per pt fusion failed)   CARPAL TUNNEL RELEASE Right done at same time of [place] fusion   CARPOMETACARPAL ([place]) FUSION OF THUMB Bilateral 2015 and 2014  approx.   CATARACT EXTRACTION W/ INTRAOCULAR LENS  IMPLANT, BILATERAL  2013   LYMPHADENECTOMY Bilateral 11/16/2018   Procedure: LYMPHADENECTOMY, PELVIC;  Surgeon: Raynelle Bring, MD;  Location: [facility];  Service: Urology;  Laterality: Bilateral;   ROBOT ASSISTED LAPAROSCOPIC RADICAL PROSTATECTOMY N/A 11/16/2018  Procedure: XI ROBOTIC ASSISTED LAPAROSCOPIC RADICAL PROSTATECTOMY LEVEL 2;  Surgeon: Raynelle Bring, MD;  Location: [facility];  Service: Urology;  Laterality: N/A;   SHOULDER SURGERY Left age 87   TONSILLECTOMY  child     Current Outpatient Medications  Medication Sig Dispense Refill   allopurinol (ZYLOPRIM) 100 MG tablet Take 200 mg by mouth daily.     atorvastatin (LIPITOR) 40 MG tablet Take 40 mg by mouth at bedtime.      diltiazem (CARDIZEM) 90 MG tablet Take 90 mg by mouth at bedtime.      fluticasone (FLONASE) 50 MCG/ACT nasal spray Place 1 spray into the nose daily as needed for allergies.      folic acid (FOLVITE) 1 MG tablet Take 1 mg by mouth daily.     HYDROcodone-acetaminophen (NORCO) 10-325 MG tablet TAKE 1 TABLET  EVERY 6 HOURS AS NEEDED FOR MODERATE TO SEVERE PAIN. 20 tablet 0   inFLIXimab-dyyb (INFLECTRA) 100 MG SOLR Inject into the vein.     methotrexate (RHEUMATREX) 2.5 MG tablet Take 12.5 mg by mouth once a week. Caution:Chemotherapy. Protect from light.     nortriptyline (PAMELOR) 10 MG capsule Take one capsule at night for one week, then take 2 capsules at night for one week, then take 3 capsules at night 90 capsule 3   omeprazole (PRILOSEC) 40 MG capsule Take 40 mg by mouth daily.      Polyvinyl Alcohol-Povidone (REFRESH OP) Place 1 drop into both eyes 2 (two) times daily as needed (dry eyes).      predniSONE (DELTASONE) 5 MG tablet Take 5 mg by mouth daily with breakfast.     traMADol (ULTRAM) 50 MG tablet TAKE (1) TABLET EVERY EIGHT HOURS AS NEEDED FOR PAIN. 60 tablet 0   tretinoin (RETIN-A) 0.05 % cream Apply a thin layer to face nightly     zolpidem (AMBIEN) 10 MG tablet Take 10 mg by mouth at bedtime.      cyclobenzaprine (FLEXERIL) 5 MG tablet Take 5 mg by mouth at bedtime as needed. (Patient not taking: Reported on 09/29/2021)     No current facility-administered medications for this visit.   Facility-Administered Medications Ordered in Other Visits  Medication Dose Route Frequency Provider Last Rate Last Admin   hydrocortisone sodium succinate (SOLU-CORTEF) injection 100 mg  100 mg Intravenous Once Raynelle Bring, MD        Allergies:   Nsaids, Brimonidine, Gabapentin, Meloxicam, Promethazine hcl, and Trazodone    Social History:  The patient  reports that he quit smoking about 9 years ago. His smoking use included cigarettes. He has a 25.00 pack-year smoking history. He has never used smokeless tobacco. He reports current alcohol use. He reports that he does not use drugs.   Family History:  The patient's family history includes Heart disease in his father and mother; Hypertension in his father and mother; Macular degeneration in his mother; Osteosarcoma in his paternal uncle and son.     ROS:  Please see the history of present illness.   Otherwise, review of systems are positive for insomnia.   All other systems are reviewed and negative.    PHYSICAL EXAM: VS:  BP (!) 142/80   Pulse 85   Ht 6\' 4"  (1.93 m)   Wt 207 lb (93.9 kg)   SpO2 97%   BMI 25.20 kg/m  , BMI Body mass index is 25.2 kg/m. GENERAL:  Well appearing HEENT:  Pupils equal round and reactive, fundi not visualized, oral mucosa unremarkable  NECK:  No jugular venous distention, waveform within normal limits, carotid upstroke brisk and symmetric, no bruits, no thyromegaly LYMPHATICS:  No cervical, inguinal adenopathy LUNGS:  Clear to auscultation bilaterally BACK:  No CVA tenderness CHEST:  Unremarkable HEART:  PMI not displaced or sustained,S1 and S2 within normal limits, no S3, no S4, no clicks, no rubs, no murmurs ABD:  Flat, positive bowel sounds normal in frequency in pitch, no bruits, no rebound, no guarding, no midline pulsatile mass, no hepatomegaly, no splenomegaly EXT:  2 plus pulses throughout, no edema, no cyanosis no clubbing SKIN:  No rashes no nodules NEURO:  Cranial nerves II through XII grossly intact, motor grossly intact throughout PSYCH:  Cognitively intact, oriented to person place and time    EKG:  EKG is ordered today. The ekg ordered today demonstrates sinus rhythm, rate 85, left axis deviation, left anterior fascicular block, no acute ST-T wave changes.   Recent Labs: No results found for requested labs within last 8760 hours.    Lipid Panel No results found for: CHOL, TRIG, HDL, CHOLHDL, VLDL, LDLCALC, LDLDIRECT    Wt Readings from Last 3 Encounters:  09/29/21 207 lb (93.9 kg)  06/08/21 202 lb 12.8 oz (92 kg)  04/02/21 194 lb 9.6 oz (88.3 kg)      Other studies Reviewed: Additional studies/ records that were reviewed today include: PET, CT, labs. Review of the above records demonstrates:  Please see elsewhere in the note.     ASSESSMENT AND PLAN:  SOB: The  patient's shortness of breath seems to be bendopathy, he has some mild chronic dyspnea but does not say that this is changed.  He is able to be physically active particularly during the summer without bringing on any significant shortness of breath.  He does not have chest pain.  This does not sound like an anginal equivalent.  Given this I think we can follow this and I have asked him to participate in an exercise regimen and if his breathing gets worse I would have a low threshold for further imaging.  ELEVATED CORONARY CALCIUM: As above.  He will need continued risk reduction.  PALPITATIONS: He has occasional palpitations but these are unchanged from previous.  HTN: He says that his blood pressure is usually well controlled and he will keep an eye on this.  AO ENLARGEMENT: I will follow this with a CT in 1 year.  RISK REDUCTION: LDL was 97 but his HDL 69.  He will continue on the meds as listed.  Current medicines are reviewed at length with the patient today.  The patient does not have concerns regarding medicines.  The following changes have been made:  no change  Labs/ tests ordered today include:   Orders Placed This Encounter  Procedures   CT ANGIO CHEST AORTA W/ & OR WO/CM & GATING ( ONLY)   Basic Metabolic Panel (BMET)   EKG 12-Lead      Disposition:   FU with me in one year after the CT.     Signed, Minus Breeding, MD  09/29/2021 3:01 PM    [place]

## 2021-09-29 ENCOUNTER — Other Ambulatory Visit: Payer: Self-pay

## 2021-09-29 ENCOUNTER — Ambulatory Visit (INDEPENDENT_AMBULATORY_CARE_PROVIDER_SITE_OTHER): Payer: Medicare Other | Admitting: Cardiology

## 2021-09-29 ENCOUNTER — Encounter: Payer: Self-pay | Admitting: Cardiology

## 2021-09-29 VITALS — BP 142/80 | HR 85 | Ht 76.0 in | Wt 207.0 lb

## 2021-09-29 DIAGNOSIS — R0602 Shortness of breath: Secondary | ICD-10-CM

## 2021-09-29 DIAGNOSIS — I77819 Aortic ectasia, unspecified site: Secondary | ICD-10-CM | POA: Diagnosis not present

## 2021-09-29 DIAGNOSIS — R002 Palpitations: Secondary | ICD-10-CM

## 2021-09-29 DIAGNOSIS — Z79899 Other long term (current) drug therapy: Secondary | ICD-10-CM

## 2021-09-29 NOTE — Patient Instructions (Signed)
Medication Instructions:  Your physician recommends that you continue on your current medications as directed. Please refer to the Current Medication list given to you today.  *If you need a refill on your cardiac medications before your next appointment, please call your pharmacy*   Lab Work: Your physician recommends that you return for lab work in:  1 year (before CT scan): BMET If you have labs (blood work) drawn today and your tests are completely normal, you will receive your results only by: [place] (if you have MyChart) OR A paper copy in the mail If you have any lab test that is abnormal or we need to change your treatment, we will call you to review the results.   Testing/Procedures: Non-Cardiac CT Angiography (CTA), is a special type of CT scan that uses a computer to produce multi-dimensional views of major blood vessels throughout the body. In CT angiography, a contrast material is injected through an IV to help visualize the blood vessels   Follow-Up: At [facility], you and your health needs are our priority.  As part of our continuing mission to provide you with exceptional heart care, we have created designated Provider Care Teams.  These Care Teams include your primary Cardiologist (physician) and Advanced Practice Providers (APPs -  Physician Assistants and Nurse Practitioners) who all work together to provide you with the care you need, when you need it.  We recommend signing up for the patient portal called "MyChart".  Sign up information is provided on this After Visit Summary.  MyChart is used to connect with patients for Virtual Visits (Telemedicine).  Patients are able to view lab/test results, encounter notes, upcoming appointments, etc.  Non-urgent messages can be sent to your provider as well.   To learn more about what you can do with MyChart, go to [email or web address].    Your next appointment:   1 year(s)  The format for your next appointment:    In Person  Provider:   Minus Breeding, MD   Other Instructions

## 2021-10-08 ENCOUNTER — Other Ambulatory Visit: Payer: Self-pay

## 2021-10-08 MED ORDER — NORTRIPTYLINE HCL 10 MG PO CAPS: ORAL_CAPSULE | ORAL | 3 refills | Status: DC

## 2021-10-08 NOTE — Progress Notes (Signed)
Rx refilled.

## 2021-10-18 HISTORY — PX: LAMINECTOMY: SHX219

## 2021-10-28 NOTE — Progress Notes (Signed)
PATIENT: Isaiah Price DOB: Oct 21, 1950  REASON FOR VISIT: Follow up for cervicogenic headache, gait disorder HISTORY FROM: Patient PRIMARY NEUROLOGIST: Dr. Krista Blue since Dr. Jannifer Franklin retired   HISTORY OF PRESENT ILLNESS: Today 10/29/21 Isaiah Price here today for follow-up with history of gait disorder and cervicogenic headache.  On nortriptyline for the neck and headache issues, is much better, neck much less bothersome, taking less hydrocodone.  MRI of the brain and nerve conduction showed no source of gait instability.  Has initially done well with physical therapy, but didn't keep up with his exercises since being treated for prostate cancer. Getting around pretty well, lately few issue if getting up turning to the right, brief feeling of loss of control, then catches it. 80% better in regards to gait since PT. Sports medicine, Dr. Micheline Chapman gives pain medications. Finished radiation for prostate cancer in September, sees Dr. Alinda Money soon. Has RA, loosing some dexterity in the hands.  HISTORY  06/08/2021 Dr. Jannifer Franklin: Isaiah Price is a 71 year old right-handed white male with a history of rheumatoid arthritis and a history of prostate cancer.  The patient in the past has had a radical prostatectomy and is now being treated for recurrence of cancer.  He has been placed on medications to block testosterone production and he is now getting external beam radiation.  The patient has been seen through Dr. Thornell Mule from ENT for a vestibulopathy.  The patient has undergone MRI of the brain and EMG and nerve conduction study evaluation, no source of gait instability was found.  The patient has had blood work as well that was unremarkable.  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  The patient particularly notes that he feels unsteady if he tilts his head backwards.  He has a sensation that he will go backwards at times, he has not had any falls since last seen.  If he  closes eyes and tries to walk down the hall, he will always veer to the right.  He has undergone physical therapy with good benefit, but after the physical therapy sessions ended, his walking problems seemed to have worsened.  He has not been able to keep up with the exercises given to him as he is undergoing treatment for his prostate cancer.  He also reports chronic issues with neck pain, he has had surgery on the neck previously.  The patient has cervicogenic headaches coming up from the back of the head occasionally associated with sharp shooting pains.  He has Flexeril to take but he does not take this frequently, he is not on Ultram frequently.  He returns to this office for further evaluation.  REVIEW OF SYSTEMS: Out of a complete 14 system review of symptoms, the patient complains only of the following symptoms, and all other reviewed systems are negative.   See HPI  ALLERGIES: Allergies  Allergen Reactions   Nsaids Other (See Comments)    Causes GI bleeds (numerous times)   Brimonidine Itching and Other (See Comments)    redness Abdominal Pain   Gabapentin Other (See Comments)    Couldn't talk   Meloxicam Other (See Comments)   Promethazine Hcl     Vision changes   Trazodone     Other reaction(s): Other (See Comments) Bladder incontinence    HOME MEDICATIONS: Outpatient Medications Prior to Visit  Medication Sig Dispense Refill   allopurinol (ZYLOPRIM) 100 MG tablet Take 200 mg by mouth daily.  atorvastatin (LIPITOR) 40 MG tablet Take 40 mg by mouth at bedtime.      diltiazem (CARDIZEM) 90 MG tablet Take 90 mg by mouth at bedtime.      fluticasone (FLONASE) 50 MCG/ACT nasal spray Place 1 spray into the nose daily as needed for allergies.      folic acid (FOLVITE) 1 MG tablet Take 1 mg by mouth daily.     HYDROcodone-acetaminophen (NORCO) 10-325 MG tablet TAKE 1 TABLET EVERY 6 HOURS AS NEEDED FOR MODERATE TO SEVERE PAIN. 20 tablet 0   inFLIXimab-dyyb (INFLECTRA) 100 MG  SOLR Inject into the vein.     methotrexate (RHEUMATREX) 2.5 MG tablet Take 12.5 mg by mouth once a week. Caution:Chemotherapy. Protect from light.     omeprazole (PRILOSEC) 40 MG capsule Take 40 mg by mouth daily.      Polyvinyl Alcohol-Povidone (REFRESH OP) Place 1 drop into both eyes 2 (two) times daily as needed (dry eyes).      predniSONE (DELTASONE) 5 MG tablet Take 5 mg by mouth daily with breakfast.     traMADol (ULTRAM) 50 MG tablet TAKE (1) TABLET EVERY EIGHT HOURS AS NEEDED FOR PAIN. 60 tablet 0   tretinoin (RETIN-A) 0.05 % cream Apply a thin layer to face nightly     zolpidem (AMBIEN) 10 MG tablet Take 10 mg by mouth at bedtime.      nortriptyline (PAMELOR) 10 MG capsule Take 3 capsules at night (Patient taking differently: Take 10 mg by mouth. Take 2 capsules at night) 90 capsule 3   cyclobenzaprine (FLEXERIL) 5 MG tablet Take 5 mg by mouth at bedtime as needed. (Patient not taking: Reported on 09/29/2021)     Facility-Administered Medications Prior to Visit  Medication Dose Route Frequency Provider Last Rate Last Admin   hydrocortisone sodium succinate (SOLU-CORTEF) injection 100 mg  100 mg Intravenous Once Raynelle Bring, MD        PAST MEDICAL HISTORY: Past Medical History:  Diagnosis Date   Achilles tendinitis, left leg    At risk for sleep apnea    STOP--BANG SCORE= 5  (routed to pt's pcp in epic 11-13-2018)   Cervicogenic headache 06/08/2021   Chronic insomnia 12/31/2015   Chronic neck pain    Congenital pectus excavatum    per pt had pulmonary test ,  60% restrictive by states asymptomatic   DDD (degenerative disc disease), cervical    Epicondylitis, lateral, right    Gait abnormality 06/08/2021   GERD (gastroesophageal reflux disease)    History of exercise intolerance    09-02-2017  ETT,  negative for ischemia   History of pericarditis 1985   HTN (hypertension)    Hypercholesteremia 12/31/2015   Iritis    IT band syndrome 05/06/2014   Lower urinary tract  symptoms (LUTS)    Neuropathy    bilateral arms with tingling and numbness at times due to cervical neck   OA (osteoarthritis)    Polyarthralgia    Prostate cancer [facility])    urologist-  dr Alinda Money--- Stage T1c, Gleason 3+4, 06/08/21 recurrent prostate cancer   Rheumatoid arthritis [facility])    rheumatologist-- dr Amil Amen--- seropositive,multiple sites   Tendon disorder 2022   torn tendon, right arm   Wears glasses     PAST SURGICAL HISTORY: Past Surgical History:  Procedure Laterality Date   ANTERIOR CERVICAL DECOMP/DISCECTOMY FUSION  20   dr Sherwood Gambler   C5 -- 7  (per pt fusion failed)   CARPAL TUNNEL RELEASE Right done at same time  of [place] fusion   CARPOMETACARPAL ([place]) FUSION OF THUMB Bilateral 2015 and 2014  approx.   CATARACT EXTRACTION W/ INTRAOCULAR LENS  IMPLANT, BILATERAL  2013   LYMPHADENECTOMY Bilateral 11/16/2018   Procedure: LYMPHADENECTOMY, PELVIC;  Surgeon: Raynelle Bring, MD;  Location: [facility];  Service: Urology;  Laterality: Bilateral;   ROBOT ASSISTED LAPAROSCOPIC RADICAL PROSTATECTOMY N/A 11/16/2018   Procedure: XI ROBOTIC ASSISTED LAPAROSCOPIC RADICAL PROSTATECTOMY LEVEL 2;  Surgeon: Raynelle Bring, MD;  Location[facility];  Service: Urology;  Laterality: N/A;   SHOULDER SURGERY Left age 15   TONSILLECTOMY  child    FAMILY HISTORY: Family History  Problem Relation Age of Onset   Heart disease Father        Aortic Valve Stenosis, CABG 77   Hypertension Father    Macular degeneration Mother    Heart disease Mother        MVR   Hypertension Mother    Osteosarcoma Paternal Uncle    Osteosarcoma Son     SOCIAL HISTORY: Social History   Socioeconomic History   Marital status: Married    Spouse name: Manuela Schwartz   Number of children: Not on file   Years of education: Not on file   Highest education level: Associate degree: academic program  Occupational History    Comment: retired  Tobacco Use   Smoking status: Former    Packs/day: 1.00    Years: 25.00    Pack  years: 25.00    Types: Cigarettes    Quit date: 12/13/2011    Years since quitting: 9.8   Smokeless tobacco: Never   Tobacco comments:    quit 1994  Vaping Use   Vaping Use: Never used  Substance and Sexual Activity   Alcohol use: Yes    Comment: occasional   Drug use: Never   Sexual activity: Not on file    Comment: vasectomy  Other Topics Concern   Not on file  Social History Narrative   Lives with wife, Retired from Adult nurse.     Social Determinants of Health   Financial Resource Strain: Not on file  Food Insecurity: Not on file  Transportation Needs: Not on file  Physical Activity: Not on file  Stress: Not on file  Social Connections: Not on file  Intimate Partner Violence: Not on file   PHYSICAL EXAM  Vitals:   10/29/21 1021  BP: 103/69  Pulse: 95  Weight: 206 lb 8 oz (93.7 kg)  Height: $Remove'6\' 4"'[id number]$  (1.93 m)   Body mass index is 25.14 kg/m.  Generalized: Well developed, in no acute distress   Neurological examination  Mentation: Alert oriented to time, place, history taking. Follows all commands speech and language fluent Cranial nerve II-XII: Pupils were equal round reactive to light. Extraocular movements were full, visual field were full on confrontational test. Facial sensation and strength were normal. Head turning and shoulder shrug  were normal and symmetric. Motor: The motor testing reveals 5 over 5 strength of all 4 extremities. Good symmetric motor tone is noted throughout.  Sensory: Sensory testing is intact to soft touch on all 4 extremities. No evidence of extinction is noted.  Coordination: Cerebellar testing reveals good finger-nose-finger and heel-to-shin bilaterally.  Gait and station: Gait is normal. Tandem gait is slightly unsteady. Reflexes: Deep tendon reflexes are symmetric and normal bilaterally.   DIAGNOSTIC DATA (LABS, IMAGING, TESTING) - I reviewed patient records, labs, notes, testing and imaging myself where available.  Lab  Results  Component Value Date   WBC  7.7 11/13/2018   HGB 12.8 (L) 11/17/2018   HCT 39.1 11/17/2018   MCV 88.9 11/13/2018   PLT 245 11/13/2018      Component Value Date/Time   NA 138 11/13/2018 1207   K 4.0 11/13/2018 1207   CL 105 11/13/2018 1207   CO2 22 11/13/2018 1207   GLUCOSE 110 (H) 11/13/2018 1207   BUN 18 11/13/2018 1207   CREATININE 0.93 11/13/2018 1207   CREATININE 1.06 12/23/2014 1045   CALCIUM 9.1 11/13/2018 1207   PROT 7.0 01/02/2021 1025   ALBUMIN 4.5 12/23/2014 1045   AST 16 12/23/2014 1045   ALT 29 12/23/2014 1045   ALKPHOS 76 12/23/2014 1045   BILITOT 0.9 12/23/2014 1045   GFRNONAA >60 11/13/2018 1207   GFRAA >60 11/13/2018 1207   No results found for: CHOL, HDL, LDLCALC, LDLDIRECT, TRIG, CHOLHDL No results found for: HGBA1C Lab Results  Component Value Date   VITAMINB12 760 01/02/2021   No results found for: TSH  ASSESSMENT AND PLAN 71 y.o. year old male  has a past medical history of Achilles tendinitis, left leg, At risk for sleep apnea, Cervicogenic headache (06/08/2021), Chronic insomnia (12/31/2015), Chronic neck pain, Congenital pectus excavatum, DDD (degenerative disc disease), cervical, Epicondylitis, lateral, right, Gait abnormality (06/08/2021), GERD (gastroesophageal reflux disease), History of exercise intolerance, History of pericarditis (1985), HTN (hypertension), Hypercholesteremia (12/31/2015), Iritis, IT band syndrome (05/06/2014), Lower urinary tract symptoms (LUTS), Neuropathy, OA (osteoarthritis), Polyarthralgia, Prostate cancer ([place]), Rheumatoid arthritis ([place]), Tendon disorder (2022), and Wears glasses. here with:  1.  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  Cervicogenic headache   3.  Rheumatoid arthritis   4.  Prostate cancer   5.  Chronic insomnia, chronic fatigue  -[name] is doing much better in regards to cervicogenic headache, gait disorder -Continue nortriptyline 20 mg at bedtime   -Encouraged to continue PT exercises, is 80% better since completing  -Continue to explore options to work on balance such a yoga or tai chi -Will follow-up in 1 year with me, will be followed by Dr. Krista Blue in future  Butler Denmark, AGNP-C, [place] 10/29/2021, 10:45 AM [facility] [address] [place], [place] [address] [phone]

## 2021-10-29 ENCOUNTER — Encounter: Payer: Self-pay | Admitting: Neurology

## 2021-10-29 ENCOUNTER — Ambulatory Visit (INDEPENDENT_AMBULATORY_CARE_PROVIDER_SITE_OTHER): Payer: Medicare Other | Admitting: Neurology

## 2021-10-29 VITALS — BP 103/69 | HR 95 | Ht 76.0 in | Wt 206.5 lb

## 2021-10-29 DIAGNOSIS — R269 Unspecified abnormalities of gait and mobility: Secondary | ICD-10-CM | POA: Diagnosis not present

## 2021-10-29 DIAGNOSIS — G4486 Cervicogenic headache: Secondary | ICD-10-CM

## 2021-10-29 DIAGNOSIS — F5104 Psychophysiologic insomnia: Secondary | ICD-10-CM | POA: Diagnosis not present

## 2021-10-29 MED ORDER — NORTRIPTYLINE HCL 10 MG PO CAPS: ORAL_CAPSULE | ORAL | 3 refills | Status: DC

## 2021-10-29 NOTE — Patient Instructions (Signed)
Continue the nortriptyline, rx sent  See you back in 1 year

## 2021-11-03 NOTE — Progress Notes (Signed)
Chart reviewed, agree above plan  If he is doing well, stable, may keep him on low dose nortriptyline, continue with PCP, return as needed.

## 2021-12-14 ENCOUNTER — Other Ambulatory Visit: Payer: Self-pay | Admitting: Sports Medicine

## 2021-12-14 MED ORDER — TRAMADOL HCL 50 MG PO TABS: ORAL_TABLET | ORAL | 0 refills | Status: DC

## 2021-12-14 MED ORDER — HYDROCODONE-ACETAMINOPHEN 10-325 MG PO TABS: ORAL_TABLET | ORAL | 0 refills | Status: DC

## 2022-02-08 ENCOUNTER — Other Ambulatory Visit: Payer: Self-pay | Admitting: Neurology

## 2022-04-01 ENCOUNTER — Ambulatory Visit
Admission: RE | Admit: 2022-04-01 | Discharge: 2022-04-01 | Disposition: A | Discharge status: Home / Self Care | Payer: Medicare Other | Source: Ambulatory Visit | Attending: Sports Medicine | Admitting: Sports Medicine

## 2022-04-01 ENCOUNTER — Ambulatory Visit (INDEPENDENT_AMBULATORY_CARE_PROVIDER_SITE_OTHER): Payer: Medicare Other | Admitting: Sports Medicine

## 2022-04-01 VITALS — BP 114/79 | Ht 76.0 in | Wt 206.0 lb

## 2022-04-01 DIAGNOSIS — M79605 Pain in left leg: Secondary | ICD-10-CM

## 2022-04-01 DIAGNOSIS — M5416 Radiculopathy, lumbar region: Secondary | ICD-10-CM | POA: Diagnosis not present

## 2022-04-01 MED ORDER — METHYLPREDNISOLONE ACETATE 80 MG/ML IJ SUSP
80.0000 mg | Freq: Once | INTRAMUSCULAR | Status: AC
  Administered 2022-04-01: 80 mg via INTRAMUSCULAR

## 2022-04-01 MED ORDER — KETOROLAC TROMETHAMINE 60 MG/2ML IM SOLN
60.0000 mg | Freq: Once | INTRAMUSCULAR | Status: AC
  Administered 2022-04-01: 60 mg via INTRAMUSCULAR

## 2022-04-01 NOTE — Addendum Note (Signed)
Addended by: Jolinda Croak E on: 04/01/2022 02:37 PM   Modules accepted: Orders

## 2022-04-01 NOTE — Progress Notes (Signed)
   Subjective:    Patient ID: Isaiah Price, male    DOB: [birth year], 71 y.o.   MRN: [id number]  HPI chief complaint: Left hip and leg pain  Isaiah Price presents today complaining of several months of posterior left hip pain that will radiate down the left leg into the top of the left foot.  Symptoms are intermittent but appear to be worse with walking.  He denies similar problems in the past.  He continues under the care of Dr. Amil Amen for his rheumatoid arthritis.  He denies any recent trauma.  No groin pain.    Review of Systems As above    Objective:   Physical Exam  Well-developed, well-nourished.  No acute distress  Lumbar spine: Fairly good lumbar range of motion.  He does have reproducible low back pain with forward flexion.  No pain with extension.  Neurological exam: No noticeable atrophy of either lower extremity.  Reflexes are equal at the patellar and Achilles tendons.  Good strength.  X-rays of his lumbar spine including AP and lateral views show fairly well-preserved disc spaces.  There is a slight degenerative curve seen on the AP view.  Nothing acute.      Assessment & Plan:   Low back pain possibly secondary to bulging lumbar disc Rheumatoid arthritis  Reassurance regarding his x-rays.  Physical exam is fairly unremarkable as well.  I recommended an IM injection of Depo-Medrol (80 mg) and IM injection of Toradol (60 mg).  I have also recommended that he try some physical therapy.  If symptoms persist after physical therapy then consider merits of further diagnostic imaging.  Follow-up for ongoing or recalcitrant issues.

## 2022-04-12 ENCOUNTER — Ambulatory Visit: Payer: Medicare Other | Attending: Sports Medicine

## 2022-04-12 DIAGNOSIS — M5417 Radiculopathy, lumbosacral region: Secondary | ICD-10-CM | POA: Insufficient documentation

## 2022-04-12 DIAGNOSIS — G5702 Lesion of sciatic nerve, left lower limb: Secondary | ICD-10-CM | POA: Diagnosis present

## 2022-04-12 DIAGNOSIS — M6281 Muscle weakness (generalized): Secondary | ICD-10-CM | POA: Insufficient documentation

## 2022-04-12 DIAGNOSIS — M5416 Radiculopathy, lumbar region: Secondary | ICD-10-CM | POA: Insufficient documentation

## 2022-04-12 DIAGNOSIS — M79605 Pain in left leg: Secondary | ICD-10-CM | POA: Insufficient documentation

## 2022-04-13 ENCOUNTER — Other Ambulatory Visit: Payer: Self-pay | Admitting: Sports Medicine

## 2022-04-13 MED ORDER — TRAMADOL HCL 50 MG PO TABS
ORAL_TABLET | ORAL | 0 refills | Status: DC
Start: 2022-04-13 — End: 2022-06-16

## 2022-04-13 NOTE — Telephone Encounter (Signed)
Refilling for Dr. Margaretha Sheffield as he's out of town - database reviewed.

## 2022-04-21 ENCOUNTER — Encounter: Payer: Self-pay | Admitting: Sports Medicine

## 2022-04-22 ENCOUNTER — Other Ambulatory Visit: Payer: Self-pay | Admitting: Sports Medicine

## 2022-04-22 ENCOUNTER — Ambulatory Visit: Payer: Medicare Other | Attending: Sports Medicine

## 2022-04-22 ENCOUNTER — Other Ambulatory Visit: Payer: Self-pay

## 2022-04-22 DIAGNOSIS — M5417 Radiculopathy, lumbosacral region: Secondary | ICD-10-CM | POA: Insufficient documentation

## 2022-04-22 DIAGNOSIS — M6281 Muscle weakness (generalized): Secondary | ICD-10-CM | POA: Diagnosis present

## 2022-04-22 DIAGNOSIS — G5702 Lesion of sciatic nerve, left lower limb: Secondary | ICD-10-CM | POA: Diagnosis present

## 2022-04-22 MED ORDER — PREDNISONE 10 MG PO TABS
ORAL_TABLET | ORAL | 0 refills | Status: DC
Start: 2022-04-22 — End: 2022-09-30

## 2022-04-22 MED ORDER — HYDROCODONE-ACETAMINOPHEN 10-325 MG PO TABS: ORAL_TABLET | ORAL | 0 refills | Status: DC

## 2022-04-22 NOTE — Therapy (Signed)
OUTPATIENT PHYSICAL THERAPY TREATMENT NOTE   Patient Name: Isaiah Price MRN: [id number] DOB:01/05/1951, 71 y.o., male Today's Date: 04/22/2022  PCP: Isaiah Sacramento, MD REFERRING PROVIDER: Thurman Coyer, DO  END OF SESSION:   PT End of Session - 04/22/22 1655     Visit Number 2    Number of Visits 12    Date for PT Re-Evaluation 06/07/22    Authorization Type Medicare so 10th visit progress note    Progress Note Due on Visit 10    PT Start Time 1657    PT Stop Time 1738    PT Time Calculation (min) 41 min    Activity Tolerance Patient tolerated treatment well;Patient limited by pain    Behavior During Therapy [facility] for tasks assessed/performed             Past Medical History:  Diagnosis Date   Achilles tendinitis, left leg    At risk for sleep apnea    STOP--BANG SCORE= 5  (routed to pt's pcp in epic 11-13-2018)   Cervicogenic headache 06/08/2021   Chronic insomnia 12/31/2015   Chronic neck pain    Congenital pectus excavatum    per pt had pulmonary test ,  60% restrictive by states asymptomatic   DDD (degenerative disc disease), cervical    Epicondylitis, lateral, right    Gait abnormality 06/08/2021   GERD (gastroesophageal reflux disease)    History of exercise intolerance    09-02-2017  ETT,  negative for ischemia   History of pericarditis 1985   HTN (hypertension)    Hypercholesteremia 12/31/2015   Iritis    IT band syndrome 05/06/2014   Lower urinary tract symptoms (LUTS)    Neuropathy    bilateral arms with tingling and numbness at times due to cervical neck   OA (osteoarthritis)    Polyarthralgia    Prostate cancer [facility])    urologist-  dr Isaiah Price--- Stage T1c, Gleason 3+4, 06/08/21 recurrent prostate cancer   Rheumatoid arthritis [facility])    rheumatologist-- dr Isaiah Price--- seropositive,multiple sites   Tendon disorder 2022   torn tendon, right arm   Wears glasses    Past Surgical History:  Procedure Laterality Date   ANTERIOR CERVICAL  DECOMP/DISCECTOMY FUSION  1991   dr Isaiah Price   C5 -- 7  (per pt fusion failed)   CARPAL TUNNEL RELEASE Right done at same time of [place] fusion   CARPOMETACARPAL ([place]) FUSION OF THUMB Bilateral 2015 and 2014  approx.   CATARACT EXTRACTION W/ INTRAOCULAR LENS  IMPLANT, BILATERAL  2013   LYMPHADENECTOMY Bilateral 11/16/2018   Procedure: LYMPHADENECTOMY, PELVIC;  Surgeon: Isaiah Bring, MD;  Location: [facility];  Service: Urology;  Laterality: Bilateral;   ROBOT ASSISTED LAPAROSCOPIC RADICAL PROSTATECTOMY N/A 11/16/2018   Procedure: XI ROBOTIC ASSISTED LAPAROSCOPIC RADICAL PROSTATECTOMY LEVEL 2;  Surgeon: Isaiah Bring, MD;  Location: [facility];  Service: Urology;  Laterality: N/A;   SHOULDER SURGERY Left age 79   TONSILLECTOMY  child   Patient Active Problem List   Diagnosis Date Noted   Palpitations 09/27/2021   Cervicogenic headache 06/08/2021   Gait abnormality 06/08/2021   Prostate cancer ([place]) 11/16/2018   Malignant neoplasm of prostate ([place]) 09/18/2018   Daytime somnolence 08/04/2017   Dyspnea on exertion 08/04/2017   Exercise intolerance 08/04/2017   Need for influenza vaccination 07/01/2017   Ulnar neuropathy at elbow, 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   Essential hypertension 01/12/2017   Groin strain 11/18/2016   Irregular bowel habits 11/18/2016   Rheumatoid arthritis ([place]) 01/22/2016  Chronic insomnia 12/31/2015   Chronic pain 12/31/2015   Esophageal reflux 12/31/2015   Hypercholesteremia 12/31/2015   Chronic arthritis 12/31/2015   Wrist pain, right 12/01/2015   IT band syndrome 05/06/2014   Cervical radiculopathy 07/14/2011   Arthritis pain, hand 06/02/2011   Osteoarthritis of CMC joint of thumb 06/02/2011    REFERRING DIAG: M79.605 (ICD-10-CM) - Left leg pain M54.16 (ICD-10-CM) - Lumbar radiculopathy   THERAPY DIAG:  Muscle weakness (generalized)  Piriformis syndrome, left  Radiculopathy, lumbosacral region  Rationale for Evaluation and Treatment Rehabilitation  PERTINENT  HISTORY: HPI chief complaint: Left hip and leg pain  Isaiah Price presents today complaining of several months of posterior left hip pain that will radiate down the left leg into the top of the left foot.  Symptoms are intermittent but appear to be worse with walking.  He denies similar problems in the past.  He continues under the care of Dr. Amil Price for his rheumatoid arthritis.  He denies any recent trauma.  No groin pain.   PRECAUTIONS: None  SUBJECTIVE: Patient reports high low back, L hip, and radiating pain. He states he mowed his yard today and after sitting down the pain started.  PAIN:  Are you having pain? Yes: NPRS scale: 8/10 Pain location: L hip Pain description: ache Aggravating factors: activity Relieving factors: R sidelie   OBJECTIVE: (objective measures completed at initial evaluation unless otherwise dated)   DIAGNOSTIC FINDINGS:  Physical Exam   Well-developed, well-nourished.  No acute distress  Lumbar spine: Fairly good lumbar range of motion.  He does have reproducible low back pain with forward flexion.  No pain with extension.   Neurological exam: No noticeable atrophy of either lower extremity.  Reflexes are equal at the patellar and Achilles tendons.  Good strength.   X-rays of his lumbar spine including AP and lateral views show fairly well-preserved disc spaces.  There is a slight degenerative curve seen on the AP view.  Nothing acute.     PATIENT SURVEYS:  FOTO 48(63 predicted)   SCREENING FOR RED FLAGS: Bowel or bladder incontinence: No     COGNITION:           Overall cognitive status: Within functional limits for tasks assessed                          SENSATION: Not tested   MUSCLE LENGTH: Hamstrings: Right NT deg; Left 30 deg     POSTURE: rounded shoulders and decreased lumbar lordosis   PALPATION: TTP L piriformis   LUMBAR ROM:    Active  A/PROM  eval  Flexion 75%  Extension 10%  Right lateral flexion    Left lateral flexion     Right rotation    Left rotation     (Blank rows = not tested)   LOWER EXTREMITY ROM:      Active  Right eval Left eval  Hip flexion   90d  Hip extension      Hip abduction      Hip adduction      Hip internal rotation      Hip external rotation      Knee flexion      Knee extension      Ankle dorsiflexion      Ankle plantarflexion      Ankle inversion      Ankle eversion       (Blank rows = not tested)   LOWER EXTREMITY MMT:  MMT Right eval Left eval  Hip flexion 4 4  Hip extension 4 4  Hip abduction 4 4  Hip adduction      Hip internal rotation      Hip external rotation      Knee flexion      Knee extension      Ankle dorsiflexion      Ankle plantarflexion      Ankle inversion      Ankle eversion       (Blank rows = not tested)   LUMBAR SPECIAL TESTS:  Straight leg raise test: Positive, Slump test: Negative, Stork standing: Negative, and FABER test: Positive   FUNCTIONAL TESTS:  5xSTS: 13 seconds    GAIT: Distance walked: 96ft x2 Assistive device utilized: None Level of assistance: Complete Independence Comments: antalgic hgait       TODAY'S TREATMENT  [facility] Adult PT Treatment:                                                DATE: 04/22/2022 Therapeutic Exercise: Nustep level 5 x 5 mins 5xSTS 13 seconds Palloff press 7# x10 BIL Standing hip abduction/extension 2x10 BIL  Standing marching (terminated due to sharp increase in pain) Bridges 2x10 Supine clamshells RTB 2x10 Supine hamstring stretch with strap x30" Lt Supine figure 4 piriformis stretch x30" Lt  04/12/2022: L piriformis release and Eval     PATIENT EDUCATION:  Education details: Discussed eval findings, rehab rationale and POC and patient is in agreement  Person educated: Patient Education method: Explanation, Demonstration, and Handouts Education comprehension: verbalized understanding, returned demonstration, and needs further education     HOME EXERCISE PROGRAM: Access  Code: [email or web address] URL: [email or web address] Date: 04/12/2022 Prepared by: Sharlynn Oliphant   Exercises - Clamshell  - 2 x daily - 7 x weekly - 1 sets - 15 reps - Supine Piriformis Stretch with Foot on Ground  - 2 x daily - 7 x weekly - 1 sets - 3 reps - 30s hold - Curl Up with Arms Crossed  - 2 x daily - 7 x weekly - 1 sets - 15 reps   ASSESSMENT:   CLINICAL IMPRESSION: Patient presents to PT with high pain in his low back, L hip, and radiating down the L leg to the top of the L foot. Session today focused on core and proximal hip strengthening as well as stretching for the L piriformis. He had a sharp increase in pain with standing  marching, this exercise was terminated. Sitting exacerbates his pain the most and standing relieves it. Patient continues to benefit from skilled PT services and should be progressed as able to improve functional independence.    OBJECTIVE IMPAIRMENTS Abnormal gait, decreased activity tolerance, decreased knowledge of condition, decreased mobility, difficulty walking, decreased ROM, and decreased strength.    ACTIVITY LIMITATIONS sitting, standing, squatting, and transfers     PERSONAL FACTORS Age, Fitness, Past/current experiences, and Time since onset of injury/illness/exacerbation are also affecting patient's functional outcome.    REHAB POTENTIAL: Good   CLINICAL DECISION MAKING: Stable/uncomplicated   EVALUATION COMPLEXITY: Moderate     GOALS: Goals reviewed with patient? Yes   SHORT TERM GOALS: Target date: 04/26/2022   Patient to demonstrate independence in HEP  Baseline:[id number] Pt reports daily adherence 04/22/22 Goal status: MET   2.  Decrease L piriformis irritation to 3/5 Baseline:  4/5 irritation Goal status: INITIAL     LONG TERM GOALS: Target date: 05/24/2022   Decrease worst pain to 4/10 Baseline: 8/10 Goal status: INITIAL   2.  120d L hip flexion Baseline: 90d Goal status: INITIAL   3.  Decrease 5x STS time to  15s Baseline: TBD Goal status: INITIAL   4.  60d L SLR Baseline: 30d L SLR Goal status: INITIAL   5.  Increase FOTO score to 63 Baseline: 48 Goal status: INITIAL       PLAN: PT FREQUENCY: 2x/week   PT DURATION: 6 weeks   PLANNED INTERVENTIONS: Therapeutic exercises, Therapeutic activity, Neuromuscular re-education, Balance training, Gait training, Patient/Family education, Joint mobilization, Stair training, Dry Needling, Manual therapy, and Re-evaluation.   PLAN FOR NEXT SESSION: HEP update, L piriformis release, strength, stretch, aerobic work, Economist, 5x STS    [employer], PTA 04/22/2022, 5:38 PM

## 2022-04-27 ENCOUNTER — Ambulatory Visit: Payer: Medicare Other

## 2022-04-27 DIAGNOSIS — M5417 Radiculopathy, lumbosacral region: Secondary | ICD-10-CM

## 2022-04-27 DIAGNOSIS — M6281 Muscle weakness (generalized): Secondary | ICD-10-CM

## 2022-04-27 DIAGNOSIS — G5702 Lesion of sciatic nerve, left lower limb: Secondary | ICD-10-CM

## 2022-04-27 NOTE — Therapy (Signed)
OUTPATIENT PHYSICAL THERAPY TREATMENT NOTE   Patient Name: Isaiah Price MRN: [id number] DOB:[birth year], 71 y.o., male Today's Date: 04/27/2022  PCP: [name], MD REFERRING PROVIDER: Thurman Coyer, DO  END OF SESSION:   PT End of Session - 04/27/22 1213     Visit Number 3    Number of Visits 12    Date for PT Re-Evaluation 06/07/22    Authorization Type Medicare so 10th visit progress note    Progress Note Due on Visit 10    PT Start Time 1215    PT Stop Time 1255    PT Time Calculation (min) 40 min    Activity Tolerance Patient tolerated treatment well;Patient limited by pain    Behavior During Therapy [facility] for tasks assessed/performed              Past Medical History:  Diagnosis Date   Achilles tendinitis, left leg    At risk for sleep apnea    STOP--BANG SCORE= 5  (routed to pt's pcp in epic 11-13-2018)   Cervicogenic headache 06/08/2021   Chronic insomnia 12/31/2015   Chronic neck pain    Congenital pectus excavatum    per pt had pulmonary test ,  60% restrictive by states asymptomatic   DDD (degenerative disc disease), cervical    Epicondylitis, lateral, right    Gait abnormality 06/08/2021   GERD (gastroesophageal reflux disease)    History of exercise intolerance    09-02-2017  ETT,  negative for ischemia   History of pericarditis 1985   HTN (hypertension)    Hypercholesteremia 12/31/2015   Iritis    IT band syndrome 05/06/2014   Lower urinary tract symptoms (LUTS)    Neuropathy    bilateral arms with tingling and numbness at times due to cervical neck   OA (osteoarthritis)    Polyarthralgia    Prostate cancer [facility])    urologist-  dr Alinda Money--- Stage T1c, Gleason 3+4, 06/08/21 recurrent prostate cancer   Rheumatoid arthritis [facility])    rheumatologist-- dr Amil Amen--- seropositive,multiple sites   Tendon disorder 2022   torn tendon, right arm   Wears glasses    Past Surgical History:  Procedure Laterality Date   ANTERIOR CERVICAL  DECOMP/DISCECTOMY FUSION  1991   dr Sherwood Gambler   C5 -- 7  (per pt fusion failed)   CARPAL TUNNEL RELEASE Right done at same time of [place] fusion   CARPOMETACARPAL ([place]) FUSION OF THUMB Bilateral 2015 and 2014  approx.   CATARACT EXTRACTION W/ INTRAOCULAR LENS  IMPLANT, BILATERAL  2013   LYMPHADENECTOMY Bilateral 11/16/2018   Procedure: LYMPHADENECTOMY, PELVIC;  Surgeon: Raynelle Bring, MD;  Location: [facility];  Service: Urology;  Laterality: Bilateral;   ROBOT ASSISTED LAPAROSCOPIC RADICAL PROSTATECTOMY N/A 11/16/2018   Procedure: XI ROBOTIC ASSISTED LAPAROSCOPIC RADICAL PROSTATECTOMY LEVEL 2;  Surgeon: Raynelle Bring, MD;  Location: [facility];  Service: Urology;  Laterality: N/A;   SHOULDER SURGERY Left age 68   TONSILLECTOMY  child   Patient Active Problem List   Diagnosis Date Noted   Palpitations 09/27/2021   Cervicogenic headache 06/08/2021   Gait abnormality 06/08/2021   Prostate cancer ([place]) 11/16/2018   Malignant neoplasm of prostate ([place]) 09/18/2018   Daytime somnolence 08/04/2017   Dyspnea on exertion 08/04/2017   Exercise intolerance 08/04/2017   Need for influenza vaccination 07/01/2017   Ulnar neuropathy at elbow, 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   Essential hypertension 01/12/2017   Groin strain 11/18/2016   Irregular bowel habits 11/18/2016   Rheumatoid arthritis ([place])  01/22/2016   Chronic insomnia 12/31/2015   Chronic pain 12/31/2015   Esophageal reflux 12/31/2015   Hypercholesteremia 12/31/2015   Chronic arthritis 12/31/2015   Wrist pain, right 12/01/2015   IT band syndrome 05/06/2014   Cervical radiculopathy 07/14/2011   Arthritis pain, hand 06/02/2011   Osteoarthritis of CMC joint of thumb 06/02/2011    REFERRING DIAG: M79.605 (ICD-10-CM) - Left leg pain M54.16 (ICD-10-CM) - Lumbar radiculopathy   THERAPY DIAG:  Muscle weakness (generalized)  Piriformis syndrome, left  Radiculopathy, lumbosacral region  Rationale for Evaluation and Treatment Rehabilitation  PERTINENT  HISTORY: HPI chief complaint: Left hip and leg pain  Isaiah Price presents today complaining of several months of posterior left hip pain that will radiate down the left leg into the top of the left foot.  Symptoms are intermittent but appear to be worse with walking.  He denies similar problems in the past.  He continues under the care of Dr. Amil Amen for his rheumatoid arthritis.  He denies any recent trauma.  No groin pain.   PRECAUTIONS: None  SUBJECTIVE: Patient reports that over the weekend he had increased pain and had some bladder incontinence. He reports that this has gotten better and hasn't any issues the past two days. He reports difficulty with HEP, especially with the supine piriformis stretch. He still states that sitting down is the most difficult at this time, especially driving.  PAIN:  Are you having pain? Yes: NPRS scale: 4/10 Pain location: L hip Pain description: ache Aggravating factors: activity Relieving factors: R sidelie   OBJECTIVE: (objective measures completed at initial evaluation unless otherwise dated)   DIAGNOSTIC FINDINGS:  Physical Exam   Well-developed, well-nourished.  No acute distress  Lumbar spine: Fairly good lumbar range of motion.  He does have reproducible low back pain with forward flexion.  No pain with extension.   Neurological exam: No noticeable atrophy of either lower extremity.  Reflexes are equal at the patellar and Achilles tendons.  Good strength.   X-rays of his lumbar spine including AP and lateral views show fairly well-preserved disc spaces.  There is a slight degenerative curve seen on the AP view.  Nothing acute.     PATIENT SURVEYS:  FOTO 48(63 predicted)   SCREENING FOR RED FLAGS: Bowel or bladder incontinence: No     COGNITION:           Overall cognitive status: Within functional limits for tasks assessed                          SENSATION: Not tested   MUSCLE LENGTH: Hamstrings: Right NT deg; Left 30 deg      POSTURE: rounded shoulders and decreased lumbar lordosis   PALPATION: TTP L piriformis   LUMBAR ROM:    Active  A/PROM  eval  Flexion 75%  Extension 10%  Right lateral flexion    Left lateral flexion    Right rotation    Left rotation     (Blank rows = not tested)   LOWER EXTREMITY ROM:      Active  Right eval Left eval  Hip flexion   90d  Hip extension      Hip abduction      Hip adduction      Hip internal rotation      Hip external rotation      Knee flexion      Knee extension      Ankle dorsiflexion  Ankle plantarflexion      Ankle inversion      Ankle eversion       (Blank rows = not tested)   LOWER EXTREMITY MMT:     MMT Right eval Left eval  Hip flexion 4 4  Hip extension 4 4  Hip abduction 4 4  Hip adduction      Hip internal rotation      Hip external rotation      Knee flexion      Knee extension      Ankle dorsiflexion      Ankle plantarflexion      Ankle inversion      Ankle eversion       (Blank rows = not tested)   LUMBAR SPECIAL TESTS:  Straight leg raise test: Positive, Slump test: Negative, Stork standing: Negative, and FABER test: Positive   FUNCTIONAL TESTS:  5xSTS: 13 seconds    GAIT: Distance walked: 65f x2 Assistive device utilized: None Level of assistance: Complete Independence Comments: antalgic hgait       TODAY'S TREATMENT  [facility] Adult PT Treatment:                                                DATE: 04/27/2022 Therapeutic Exercise: STS x10 UE crossed Palloff press 7# 2x10 BIL Standing hip abduction/extension 2x10 BIL  Bridges 2x10 GTB around thighs Bridge with alternate marching 2x10 BIL GTB around thighs Supine clamshells GTB 2x10 Supine hamstring stretch with strap 3x30" Lt Supine ITB stretch with strap 2x30" Lt Supine figure 4 piriformis stretch 2x30" Lt push/pull Prone alternating UE/LE lift 2x10  Prone on elbows 2x10 Supine sciatic nerve glide x20 Lt   [facility] Adult PT Treatment:                                                 DATE: 04/22/2022 Therapeutic Exercise: Nustep level 5 x 5 mins 5xSTS 13 seconds Palloff press 7# x10 BIL Standing hip abduction/extension 2x10 BIL  Standing marching (terminated due to sharp increase in pain) Bridges 2x10 Supine clamshells RTB 2x10 Supine hamstring stretch with strap x30" Lt Supine figure 4 piriformis stretch x30" Lt  04/12/2022: L piriformis release and Eval     PATIENT EDUCATION:  Education details: Discussed eval findings, rehab rationale and POC and patient is in agreement  Person educated: Patient Education method: Explanation, Demonstration, and Handouts Education comprehension: verbalized understanding, returned demonstration, and needs further education     HOME EXERCISE PROGRAM: Access Code: [email or web address]: [email or web address] Date: 04/12/2022 Prepared by: JSharlynn Oliphant  Exercises - Clamshell  - 2 x daily - 7 x weekly - 1 sets - 15 reps - Supine Piriformis Stretch with Foot on Ground  - 2 x daily - 7 x weekly - 1 sets - 3 reps - 30s hold - Curl Up with Arms Crossed  - 2 x daily - 7 x weekly - 1 sets - 15 reps   ASSESSMENT:   CLINICAL IMPRESSION: Patient presents to PT with current moderate pain in his low back, L hip, and radiating down L leg but reports very high pain over the weekend also with a few instances of bladder incontinence. Patient was advised to follow up  with his MD regarding this. Session today focused on core and proximal hip strengthening as well as stretching for the L piriformis. Patient was able to tolerate all prescribed exercises with no adverse effects. Patient continues to benefit from skilled PT services and should be progressed as able to improve functional independence.    OBJECTIVE IMPAIRMENTS Abnormal gait, decreased activity tolerance, decreased knowledge of condition, decreased mobility, difficulty walking, decreased ROM, and decreased strength.    ACTIVITY LIMITATIONS  sitting, standing, squatting, and transfers     PERSONAL FACTORS Age, Fitness, Past/current experiences, and Time since onset of injury/illness/exacerbation are also affecting patient's functional outcome.    REHAB POTENTIAL: Good   CLINICAL DECISION MAKING: Stable/uncomplicated   EVALUATION COMPLEXITY: Moderate     GOALS: Goals reviewed with patient? Yes   SHORT TERM GOALS: Target date: 04/26/2022   Patient to demonstrate independence in HEP  Baseline:[id number] Pt reports daily adherence 04/22/22 Goal status: MET   2.  Decrease L piriformis irritation to 3/5 Baseline: 4/5 irritation Goal status: Ongoing     LONG TERM GOALS: Target date: 05/24/2022   Decrease worst pain to 4/10 Baseline: 8/10 Goal status: INITIAL   2.  120d L hip flexion Baseline: 90d Goal status: INITIAL   3.  Decrease 5x STS time to 15s Baseline: 13sec 04/22/2022 Goal status: MET   4.  60d L SLR Baseline: 30d L SLR Goal status: INITIAL   5.  Increase FOTO score to 63 Baseline: 48 Goal status: INITIAL       PLAN: PT FREQUENCY: 2x/week   PT DURATION: 6 weeks   PLANNED INTERVENTIONS: Therapeutic exercises, Therapeutic activity, Neuromuscular re-education, Balance training, Gait training, Patient/Family education, Joint mobilization, Stair training, Dry Needling, Manual therapy, and Re-evaluation.   PLAN FOR NEXT SESSION: HEP update, L piriformis release, strength, stretch, aerobic work, Biomedical engineer, PTA 04/27/2022, 12:56 PM

## 2022-04-29 ENCOUNTER — Ambulatory Visit: Payer: Medicare Other

## 2022-04-29 DIAGNOSIS — M6281 Muscle weakness (generalized): Secondary | ICD-10-CM | POA: Diagnosis not present

## 2022-04-29 DIAGNOSIS — M5417 Radiculopathy, lumbosacral region: Secondary | ICD-10-CM

## 2022-04-29 DIAGNOSIS — G5702 Lesion of sciatic nerve, left lower limb: Secondary | ICD-10-CM

## 2022-04-29 NOTE — Therapy (Signed)
OUTPATIENT PHYSICAL THERAPY TREATMENT NOTE   Patient Name: Isaiah [facility] MRN: [id number] DOB:09/25/1951, 70 y.o., male Today's Date: 04/29/2022  PCP: Wilson, Fred H, MD REFERRING PROVIDER: Draper, Timothy R, DO  END OF SESSION:   PT End of Session - 04/29/22 1119     Visit Number 4    Number of Visits 12    Date for PT Re-Evaluation 06/07/22    Authorization Type Medicare so 10th visit progress note    Progress Note Due on Visit 10    PT Start Time 1120    PT Stop Time 1205    PT Time Calculation (min) 45 min    Activity Tolerance Patient tolerated treatment well;Patient limited by pain    Behavior During Therapy WFL for tasks assessed/performed               Past Medical History:  Diagnosis Date   Achilles tendinitis, left leg    At risk for sleep apnea    STOP--BANG SCORE= 5  (routed to pt's pcp in epic 11-13-2018)   Cervicogenic headache 06/08/2021   Chronic insomnia 12/31/2015   Chronic neck pain    Congenital pectus excavatum    per pt had pulmonary test ,  60% restrictive by states asymptomatic   DDD (degenerative disc disease), cervical    Epicondylitis, lateral, right    Gait abnormality 06/08/2021   GERD (gastroesophageal reflux disease)    History of exercise intolerance    09-02-2017  ETT,  negative for ischemia   History of pericarditis 1985   HTN (hypertension)    Hypercholesteremia 12/31/2015   Iritis    IT band syndrome 05/06/2014   Lower urinary tract symptoms (LUTS)    Neuropathy    bilateral arms with tingling and numbness at times due to cervical neck   OA (osteoarthritis)    Polyarthralgia    Prostate cancer (HCC)    urologist-  dr borden--- Stage T1c, Gleason 3+4, 06/08/21 recurrent prostate cancer   Rheumatoid arthritis (HCC)    rheumatologist-- dr beekman--- seropositive,multiple sites   Tendon disorder 2022   torn tendon, right arm   Wears glasses    Past Surgical History:  Procedure Laterality Date   ANTERIOR CERVICAL  DECOMP/DISCECTOMY FUSION  1991   dr nudelman   C5 -- 7  (per pt fusion failed)   CARPAL TUNNEL RELEASE Right done at same time of CMC fusion   CARPOMETACARPAL ([facility]) FUSION OF THUMB Bilateral 2015 and 2014  approx.   CATARACT EXTRACTION W/ INTRAOCULAR LENS  IMPLANT, BILATERAL  2013   LYMPHADENECTOMY Bilateral 11/16/2018   Procedure: LYMPHADENECTOMY, PELVIC;  Surgeon: Borden, Lester, MD;  Location: [facility];  Service: Urology;  Laterality: Bilateral;   ROBOT ASSISTED LAPAROSCOPIC RADICAL PROSTATECTOMY N/A 11/16/2018   Procedure: XI ROBOTIC ASSISTED LAPAROSCOPIC RADICAL PROSTATECTOMY LEVEL 2;  Surgeon: Borden, Lester, MD;  Location: [facility];  Service: Urology;  Laterality: N/A;   SHOULDER SURGERY Left age 18   TONSILLECTOMY  child   Patient Active Problem List   Diagnosis Date Noted   Palpitations 09/27/2021   Cervicogenic headache 06/08/2021   Gait abnormality 06/08/2021   Prostate cancer (HCC) 11/16/2018   Malignant neoplasm of prostate (HCC) 09/18/2018   Daytime somnolence 08/04/2017   Dyspnea on exertion 08/04/2017   Exercise intolerance 08/04/2017   Need for influenza vaccination 07/01/2017   Ulnar neuropathy at elbow, 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   Essential hypertension 01/12/2017   Groin strain 11/18/2016   Irregular bowel habits 11/18/2016   Rheumatoid arthritis (  [place]) 01/22/2016   Chronic insomnia 12/31/2015   Chronic pain 12/31/2015   Esophageal reflux 12/31/2015   Hypercholesteremia 12/31/2015   Chronic arthritis 12/31/2015   Wrist pain, right 12/01/2015   IT band syndrome 05/06/2014   Cervical radiculopathy 07/14/2011   Arthritis pain, hand 06/02/2011   Osteoarthritis of CMC joint of thumb 06/02/2011    REFERRING DIAG: M79.605 (ICD-10-CM) - Left leg pain M54.16 (ICD-10-CM) - Lumbar radiculopathy   THERAPY DIAG:  Muscle weakness (generalized)  Piriformis syndrome, left  Radiculopathy, lumbosacral region  Rationale for Evaluation and Treatment Rehabilitation  PERTINENT  HISTORY: HPI chief complaint: Left hip and leg pain  Isaiah Price presents today complaining of several months of posterior left hip pain that will radiate down the left leg into the top of the left foot.  Symptoms are intermittent but appear to be worse with walking.  He denies similar problems in the past.  He continues under the care of Dr. Amil Amen for his rheumatoid arthritis.  He denies any recent trauma.  No groin pain.   PRECAUTIONS: None  SUBJECTIVE: Patient reports that his pain is still the worst while sitting, especially while driving and also while having his RA infusions done as they require sitting for 2.5 hours.  PAIN:  Are you having pain? Yes: NPRS scale: 5/10 (8/10 at worst last night) Pain location: L hip Pain description: ache Aggravating factors: activity Relieving factors: R sidelie   OBJECTIVE: (objective measures completed at initial evaluation unless otherwise dated)   DIAGNOSTIC FINDINGS:  Physical Exam   Well-developed, well-nourished.  No acute distress  Lumbar spine: Fairly good lumbar range of motion.  He does have reproducible low back pain with forward flexion.  No pain with extension.   Neurological exam: No noticeable atrophy of either lower extremity.  Reflexes are equal at the patellar and Achilles tendons.  Good strength.   X-rays of his lumbar spine including AP and lateral views show fairly well-preserved disc spaces.  There is a slight degenerative curve seen on the AP view.  Nothing acute.     PATIENT SURVEYS:  FOTO 48(63 predicted)   SCREENING FOR RED FLAGS: Bowel or bladder incontinence: No     COGNITION:           Overall cognitive status: Within functional limits for tasks assessed                          SENSATION: Not tested   MUSCLE LENGTH: Hamstrings: Right NT deg; Left 30 deg     POSTURE: rounded shoulders and decreased lumbar lordosis   PALPATION: TTP L piriformis   LUMBAR ROM:    Active  A/PROM  eval  Flexion 75%   Extension 10%  Right lateral flexion    Left lateral flexion    Right rotation    Left rotation     (Blank rows = not tested)   LOWER EXTREMITY ROM:      Active  Right eval Left eval  Hip flexion   90d  Hip extension      Hip abduction      Hip adduction      Hip internal rotation      Hip external rotation      Knee flexion      Knee extension      Ankle dorsiflexion      Ankle plantarflexion      Ankle inversion      Ankle eversion       (  Blank rows = not tested)   LOWER EXTREMITY MMT:     MMT Right eval Left eval  Hip flexion 4 4  Hip extension 4 4  Hip abduction 4 4  Hip adduction      Hip internal rotation      Hip external rotation      Knee flexion      Knee extension      Ankle dorsiflexion      Ankle plantarflexion      Ankle inversion      Ankle eversion       (Blank rows = not tested)   LUMBAR SPECIAL TESTS:  Straight leg raise test: Positive, Slump test: Negative, Stork standing: Negative, and FABER test: Positive   FUNCTIONAL TESTS:  5xSTS: 13 seconds    GAIT: Distance walked: 75ft x2 Assistive device utilized: None Level of assistance: Complete Independence Comments: antalgic hgait       TODAY'S TREATMENT  [facility] Adult PT Treatment:                                                DATE: 04/29/2022 Therapeutic Exercise: Palloff press 10# 2x10 BIL Standing hip abduction/extension 2x10 BIL RTB at ankles Side stepping RTB ankles x2 laps at counter SLR 2x10 Bridges 2x10 GTB around thighs Bridge with alternate marching 2x10 BIL GTB around thighs Supine clamshells GTB 2x10 Supine hamstring stretch with strap 3x30" Lt Supine ITB stretch with strap x45" Lt Supine figure 4 piriformis stretch x30" Lt push/pull Prone alternating UE/LE lift 2x10  Prone on elbows 2x10 Supine sciatic nerve glide x20 Lt   [facility] Adult PT Treatment:                                                DATE: 04/27/2022 Therapeutic Exercise: STS x10 UE crossed Palloff  press 7# 2x10 BIL Standing hip abduction/extension 2x10 BIL  Bridges 2x10 GTB around thighs Bridge with alternate marching 2x10 BIL GTB around thighs Supine clamshells GTB 2x10 Supine hamstring stretch with strap 3x30" Lt Supine ITB stretch with strap 2x30" Lt Supine figure 4 piriformis stretch 2x30" Lt push/pull Prone alternating UE/LE lift 2x10  Prone on elbows 2x10 Supine sciatic nerve glide x20 Lt   [facility] Adult PT Treatment:                                                DATE: 04/22/2022 Therapeutic Exercise: Nustep level 5 x 5 mins 5xSTS 13 seconds Palloff press 7# x10 BIL Standing hip abduction/extension 2x10 BIL  Standing marching (terminated due to sharp increase in pain) Bridges 2x10 Supine clamshells RTB 2x10 Supine hamstring stretch with strap x30" Lt Supine figure 4 piriformis stretch x30" Lt  04/12/2022: L piriformis release and Eval     PATIENT EDUCATION:  Education details: Discussed eval findings, rehab rationale and POC and patient is in agreement  Person educated: Patient Education method: Explanation, Demonstration, and Handouts Education comprehension: verbalized understanding, returned demonstration, and needs further education     HOME EXERCISE PROGRAM: Access Code: [email or web address] URL: [email or web address] Date: 04/12/2022 Prepared by: Jeffrey Ziemba     Exercises - Clamshell  - 2 x daily - 7 x weekly - 1 sets - 15 reps - Supine Piriformis Stretch with Foot on Ground  - 2 x daily - 7 x weekly - 1 sets - 3 reps - 30s hold - Curl Up with Arms Crossed  - 2 x daily - 7 x weekly - 1 sets - 15 reps   ASSESSMENT:   CLINICAL IMPRESSION: Patient presents to PT with moderate pain in his L piriformis and hip area and radiating down the L leg. He has mentioned interest in TPDN to help decrease pain and tissue restriction. Session today continued to focus on core and proximal hip strengthening as well as stretching for the L piriformis and hamstring.  Patient was able to tolerate all prescribed exercises with no adverse effects. Patient continues to benefit from skilled PT services and should be progressed as able to improve functional independence.   OBJECTIVE IMPAIRMENTS Abnormal gait, decreased activity tolerance, decreased knowledge of condition, decreased mobility, difficulty walking, decreased ROM, and decreased strength.    ACTIVITY LIMITATIONS sitting, standing, squatting, and transfers     PERSONAL FACTORS Age, Fitness, Past/current experiences, and Time since onset of injury/illness/exacerbation are also affecting patient's functional outcome.    REHAB POTENTIAL: Good   CLINICAL DECISION MAKING: Stable/uncomplicated   EVALUATION COMPLEXITY: Moderate     GOALS: Goals reviewed with patient? Yes   SHORT TERM GOALS: Target date: 04/26/2022   Patient to demonstrate independence in HEP  Baseline:[id number] Pt reports daily adherence 04/22/22 Goal status: MET   2.  Decrease L piriformis irritation to 3/5 Baseline: 4/5 irritation Goal status: Ongoing     LONG TERM GOALS: Target date: 05/24/2022   Decrease worst pain to 4/10 Baseline: 8/10 Goal status: INITIAL   2.  120d L hip flexion Baseline: 90d Goal status: INITIAL   3.  Decrease 5x STS time to 15s Baseline: 13sec 04/22/2022 Goal status: MET   4.  60d L SLR Baseline: 30d L SLR Goal status: INITIAL   5.  Increase FOTO score to 63 Baseline: 48 Goal status: INITIAL       PLAN: PT FREQUENCY: 2x/week   PT DURATION: 6 weeks   PLANNED INTERVENTIONS: Therapeutic exercises, Therapeutic activity, Neuromuscular re-education, Balance training, Gait training, Patient/Family education, Joint mobilization, Stair training, Dry Needling, Manual therapy, and Re-evaluation.   PLAN FOR NEXT SESSION: HEP update, L piriformis release, strength, stretch, aerobic work, body mechanics     , PTA 04/29/2022, 11:20 AM    

## 2022-05-04 ENCOUNTER — Ambulatory Visit: Payer: Medicare Other

## 2022-05-04 DIAGNOSIS — G5702 Lesion of sciatic nerve, left lower limb: Secondary | ICD-10-CM

## 2022-05-04 DIAGNOSIS — M6281 Muscle weakness (generalized): Secondary | ICD-10-CM

## 2022-05-04 NOTE — Therapy (Addendum)
OUTPATIENT PHYSICAL THERAPY TREATMENT NOTE/DC SUMMARY   Patient Name: Isaiah Price MRN: [id number] DOB:[birth year], 71 y.o., male Today's Date: 05/04/2022  PCP: Isaiah [place], MD REFERRING PROVIDER: Thurman Coyer, DO PHYSICAL THERAPY DISCHARGE SUMMARY  Visits from Start of Care: 5  Current functional level related to goals / functional outcomes: Goals partially met   Remaining deficits: pain   Education / Equipment: HEP   Patient agrees to discharge. Patient goals were partially met. Patient is being discharged due to  need for surgical intervention.  END OF SESSION:   PT End of Session - 05/04/22 1218     Visit Number 5    Number of Visits 12    Date for PT Re-Evaluation 06/07/22    Authorization Type Medicare so 10th visit progress note    Progress Note Due on Visit 10    PT Start Time 1215    PT Stop Time 1255    PT Time Calculation (min) 40 min    Activity Tolerance Patient tolerated treatment well;Patient limited by pain    Behavior During Therapy Isaiah Price for tasks assessed/performed                Past Medical History:  Diagnosis Date   Achilles tendinitis, left leg    At risk for sleep apnea    STOP--BANG SCORE= 5  (routed to pt's pcp in epic 11-13-2018)   Cervicogenic headache 06/08/2021   Chronic insomnia 12/31/2015   Chronic neck pain    Congenital pectus excavatum    per pt had pulmonary test ,  60% restrictive by states asymptomatic   DDD (degenerative disc disease), cervical    Epicondylitis, lateral, right    Gait abnormality 06/08/2021   GERD ([facility] reflux disease)    History of exercise intolerance    09-02-2017  ETT,  negative for ischemia   History of pericarditis 1985   HTN (hypertension)    Hypercholesteremia 12/31/2015   Iritis    IT band syndrome 05/06/2014   Lower urinary tract symptoms (LUTS)    Neuropathy    bilateral arms with tingling and numbness at times due to cervical neck   OA (osteoarthritis)     Polyarthralgia    Prostate cancer [facility])    urologist-  dr [facility]--- Stage T1c, Gleason 3+4, 06/08/21 recurrent prostate cancer   Rheumatoid arthritis [facility])    rheumatologist-- dr [facility]--- seropositive,multiple sites   Tendon disorder 2022   torn tendon, right arm   Wears glasses    Past Surgical History:  Procedure Laterality Date   ANTERIOR CERVICAL DECOMP/DISCECTOMY FUSION  1991   dr [facility]   C5 -- 7  (per pt fusion failed)   CARPAL TUNNEL RELEASE Right done at same time of [place] fusion   CARPOMETACARPAL ([facility]) FUSION OF THUMB Bilateral 2015 and 2014  approx.   CATARACT EXTRACTION W/ INTRAOCULAR LENS  IMPLANT, BILATERAL  2013   LYMPHADENECTOMY Bilateral 11/16/2018   Procedure: LYMPHADENECTOMY, PELVIC;  Surgeon: Isaiah Bring, MD;  Location: [facility];  Service: Urology;  Laterality: Bilateral;   ROBOT ASSISTED LAPAROSCOPIC RADICAL PROSTATECTOMY N/A 11/16/2018   Procedure: XI ROBOTIC ASSISTED LAPAROSCOPIC RADICAL PROSTATECTOMY LEVEL 2;  Surgeon: Isaiah Bring, MD;  Location: [facility];  Service: Urology;  Laterality: N/A;   SHOULDER SURGERY Left age 63   TONSILLECTOMY  child   Patient Active Problem List   Diagnosis Date Noted   Palpitations 09/27/2021   Cervicogenic headache 06/08/2021   Gait abnormality 06/08/2021   Prostate cancer ([facility]) 11/16/2018  Malignant neoplasm of prostate ([facility]) 09/18/2018   Daytime somnolence 08/04/2017   Dyspnea on exertion 08/04/2017   Exercise intolerance 08/04/2017   Need for influenza vaccination 07/01/2017   Ulnar neuropathy at elbow, 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   Essential hypertension 01/12/2017   Groin strain 11/18/2016   Irregular bowel habits 11/18/2016   Rheumatoid arthritis ([facility]) 01/22/2016   Chronic insomnia 12/31/2015   Chronic pain 12/31/2015   Esophageal reflux 12/31/2015   Hypercholesteremia 12/31/2015   Chronic arthritis 12/31/2015   Wrist pain, right 12/01/2015   IT band syndrome 05/06/2014   Cervical radiculopathy 07/14/2011   Arthritis  pain, hand 06/02/2011   Osteoarthritis of CMC joint of thumb 06/02/2011    REFERRING DIAG: M79.605 (ICD-10-CM) - Left leg pain M54.16 (ICD-10-CM) - Lumbar radiculopathy   THERAPY DIAG:  Muscle weakness (generalized)  Piriformis syndrome, left  Rationale for Evaluation and Treatment Rehabilitation  PERTINENT HISTORY: HPI chief complaint: Left hip and leg pain  Isaiah Price presents today complaining of several months of posterior left hip pain that will radiate down the left leg into the top of the left foot.  Symptoms are intermittent but appear to be worse with walking.  He denies similar problems in the past.  He continues under the care of Dr. Amil Price for his rheumatoid arthritis.  He denies any recent trauma.  No groin pain.   PRECAUTIONS: None  SUBJECTIVE: Unchanged overall, good days/bad days. Has begun to note symptoms B in toes.  Unable to definitively define agravating relieving factors but does note pain with lumbosacral compressive tasks  PAIN:  Are you having pain? Yes: NPRS scale: 5/10 (8/10 at worst last night) Pain location: L hip Pain description: ache Aggravating factors: activity Relieving factors: R sidelie   OBJECTIVE: (objective measures completed at initial evaluation unless otherwise dated)   DIAGNOSTIC FINDINGS:  Physical Exam   Well-developed, well-nourished.  No acute distress  Lumbar spine: Fairly good lumbar range of motion.  He does have reproducible low back pain with forward flexion.  No pain with extension.   Neurological exam: No noticeable atrophy of either lower extremity.  Reflexes are equal at the patellar and Achilles tendons.  Good strength.   X-rays of his lumbar spine including AP and lateral views show fairly well-preserved disc spaces.  There is a slight degenerative curve seen on the AP view.  Nothing acute.     PATIENT SURVEYS:  FOTO 48(63 predicted)   SCREENING FOR RED FLAGS: Bowel or bladder incontinence: No     COGNITION:            Overall cognitive status: Within functional limits for tasks assessed                          SENSATION: Not tested   MUSCLE LENGTH: Hamstrings: Right NT deg; Left 30 deg     POSTURE: rounded shoulders and decreased lumbar lordosis   PALPATION: TTP L piriformis   LUMBAR ROM:    Active  A/PROM  eval 05/04/22  Flexion 75% 75%  Extension 10% 10%  Right lateral flexion   75%  Left lateral flexion   75%  Right rotation     Left rotation      (Blank rows = not tested)   LOWER EXTREMITY ROM:      Active  Right eval Left eval 05/04/22  Hip flexion   90d 120d  Hip extension       Hip abduction  Hip adduction       Hip internal rotation       Hip external rotation       Knee flexion       Knee extension       Ankle dorsiflexion       Ankle plantarflexion       Ankle inversion       Ankle eversion        (Blank rows = not tested)   LOWER EXTREMITY MMT:     MMT Right eval Left eval  Hip flexion 4 4  Hip extension 4 4  Hip abduction 4 4  Hip adduction      Hip internal rotation      Hip external rotation      Knee flexion      Knee extension      Ankle dorsiflexion      Ankle plantarflexion      Ankle inversion      Ankle eversion       (Blank rows = not tested)   LUMBAR SPECIAL TESTS:  Straight leg raise test: Positive, Slump test: Negative, Stork standing: Negative, and FABER test: Positive 05/04/22 SLR test neg L, slump test neg L, sciatic glide unremarkable L, stork test positive L, PA mobs to L4-5 positive for LLE symptoms   FUNCTIONAL TESTS:  5xSTS: 13 seconds    GAIT: Distance walked: 65f x2 Assistive device utilized: None Level of assistance: Complete Independence Comments: antalgic hgait       TODAY'S TREATMENT  [facility] Adult PT Treatment:                                                DATE: 05/04/22 Re-assessment-see clinical assessment and objective Manual Therapy: L piriformis release  6 min. PA mobs L4-5 3x10 grade III Mobs to  L ilium through PSIS, lateral distraction and posterior rotation 3x10  [facility] Adult PT Treatment:                                                DATE: 04/29/2022 Therapeutic Exercise: Palloff press 10# 2x10 BIL Standing hip abduction/extension 2x10 BIL RTB at ankles Side stepping RTB ankles x2 laps at counter SLR 2x10 Bridges 2x10 GTB around thighs Bridge with alternate marching 2x10 BIL GTB around thighs Supine clamshells GTB 2x10 Supine hamstring stretch with strap 3x30" Lt Supine ITB stretch with strap x45" Lt Supine figure 4 piriformis stretch x30" Lt push/pull Prone alternating UE/LE lift 2x10  Prone on elbows 2x10 Supine sciatic nerve glide x20 Lt   [facility] Adult PT Treatment:                                                DATE: 04/27/2022 Therapeutic Exercise: STS x10 UE crossed Palloff press 7# 2x10 BIL Standing hip abduction/extension 2x10 BIL  Bridges 2x10 GTB around thighs Bridge with alternate marching 2x10 BIL GTB around thighs Supine clamshells GTB 2x10 Supine hamstring stretch with strap 3x30" Lt Supine ITB stretch with strap 2x30" Lt Supine figure 4 piriformis stretch 2x30" Lt push/pull Prone alternating UE/LE lift  2x10  Prone on elbows 2x10 Supine sciatic nerve glide x20 Lt   [facility] Adult PT Treatment:                                                DATE: 04/22/2022 Therapeutic Exercise: Nustep level 5 x 5 mins 5xSTS 13 seconds Palloff press 7# x10 BIL Standing hip abduction/extension 2x10 BIL  Standing marching (terminated due to sharp increase in pain) Bridges 2x10 Supine clamshells RTB 2x10 Supine hamstring stretch with strap x30" Lt Supine figure 4 piriformis stretch x30" Lt  04/12/2022: L piriformis release and Eval     PATIENT EDUCATION:  Education details: Discussed eval findings, rehab rationale and POC and patient is in agreement  Person educated: Patient Education method: Explanation, Demonstration, and Handouts Education comprehension: verbalized  understanding, returned demonstration, and needs further education     HOME EXERCISE PROGRAM: Access Code: [email or web address] URL: [email or web address] Date: 04/12/2022 Prepared by: Sharlynn Oliphant   Exercises - Clamshell  - 2 x daily - 7 x weekly - 1 sets - 15 reps - Supine Piriformis Stretch with Foot on Ground  - 2 x daily - 7 x weekly - 1 sets - 3 reps - 30s hold - Curl Up with Arms Crossed  - 2 x daily - 7 x weekly - 1 sets - 15 reps   ASSESSMENT:   CLINICAL IMPRESSION: Continued symptoms centered around L SI joint primarily and piriformis secondary, positive stork sign at L SI joint for immobility, PA mobs at L4-5 also provoke LLE symptoms.  No definitive symptoms with nerve tension testing. Overall less sensitivity to testing but symptoms still remain limiting patient function and QOL.  Lumbar ROM restricted into extension but flexion and side bending are functional.  Patient may benefit from additional imaging studies to assess soft tissues.  OBJECTIVE IMPAIRMENTS Abnormal gait, decreased activity tolerance, decreased knowledge of condition, decreased mobility, difficulty walking, decreased ROM, and decreased strength.    ACTIVITY LIMITATIONS sitting, standing, squatting, and transfers     PERSONAL FACTORS Age, Fitness, Past/current experiences, and Time since onset of injury/illness/exacerbation are also affecting patient's functional outcome.    REHAB POTENTIAL: Good   CLINICAL DECISION MAKING: Stable/uncomplicated   EVALUATION COMPLEXITY: Moderate     GOALS: Goals reviewed with patient? Yes   SHORT TERM GOALS: Target date: 04/26/2022   Patient to demonstrate independence in HEP  Baseline:[id number] Pt reports daily adherence 04/22/22 Goal status: MET   2.  Decrease L piriformis irritation to 3/5 Baseline: 4/5 irritation Goal status: Ongoing     LONG TERM GOALS: Target date: 05/24/2022   Decrease worst pain to 4/10 Baseline: 8/10 Goal status: INITIAL   2.   120d L hip flexion Baseline: 90d; 120d PROM Goal status: Met   3.  Decrease 5x STS time to 15s Baseline: 13sec 04/22/2022 Goal status: MET   4.  60d L SLR Baseline: 30d L SLR; 05/04/22 60d SLR L Goal status: Met   5.  Increase FOTO score to 63 Baseline: 48 Goal status: INITIAL       PLAN: PT FREQUENCY: 2x/week   PT DURATION: 6 weeks   PLANNED INTERVENTIONS: Therapeutic exercises, Therapeutic activity, Neuromuscular re-education, Balance training, Gait training, Patient/Family education, Joint mobilization, Stair training, Dry Needling, Manual therapy, and Re-evaluation.   PLAN FOR NEXT SESSION: HEP update, L piriformis release, strength, stretch, aerobic work, body  mechanics.  Follow up on MD visit and possibility of imaging studies.    Lanice Shirts, PT May 22, 2022, 1:29 PM

## 2022-05-06 ENCOUNTER — Ambulatory Visit (INDEPENDENT_AMBULATORY_CARE_PROVIDER_SITE_OTHER): Payer: Medicare Other | Admitting: Sports Medicine

## 2022-05-06 ENCOUNTER — Ambulatory Visit: Payer: Medicare Other

## 2022-05-06 VITALS — BP 110/72 | Ht 76.0 in | Wt 206.0 lb

## 2022-05-06 DIAGNOSIS — M5440 Lumbago with sciatica, unspecified side: Secondary | ICD-10-CM | POA: Diagnosis present

## 2022-05-06 MED ORDER — METHYLPREDNISOLONE ACETATE 80 MG/ML IJ SUSP
80.0000 mg | Freq: Once | INTRAMUSCULAR | Status: AC
  Administered 2022-05-06: 80 mg via INTRAMUSCULAR

## 2022-05-06 NOTE — Progress Notes (Signed)
   Subjective:    Patient ID: Isaiah Price, male    DOB: [birth year], 71 y.o.   MRN: [id number]  HPI  Isaiah Price presents today for follow-up on left leg radiculopathy.  He continues to have pain despite physical therapy.  He is now experiencing numbness in his left great toe and the smaller toes in the right foot.  He did get some benefit from a 12-day Sterapred Dosepak but his pain has now returned.  X-rays of his lumbar spine showed multilevel degenerative disc disease and multilevel lower lumbar facet arthropathy.    Review of Systems As above    Objective:   Physical Exam  Well-developed, well-nourished.  Neurological exam: Left patellar reflex is 1/4 compared to 2/4 on the right.  Achilles reflexes are trace but equal bilaterally.  No atrophy.  He does have good strength in both lower extremities.      Assessment & Plan:   Persistent left leg radiculopathy  Given the patient's persistent symptoms despite physical therapy as well as degenerative changes seen on x-ray I recommended an MRI of his lumbar spine to rule out spinal stenosis as well as a ruptured lumbar herniated disc.  Phone follow-up with those results when available.  Hopefully we will find something amendable to an epidural steroid injection.  For today's pain we elected to try an 80 mg IM Depo-Medrol injection.  He may continue with his other pain medications as needed as well.  This note was dictated using Dragon naturally speaking software and may contain errors in syntax, spelling, or content which have not been identified prior to signing this note.

## 2022-05-11 ENCOUNTER — Ambulatory Visit
Admission: RE | Admit: 2022-05-11 | Discharge: 2022-05-11 | Disposition: A | Discharge status: Home / Self Care | Payer: Medicare Other | Source: Ambulatory Visit | Attending: Sports Medicine | Admitting: Sports Medicine

## 2022-05-11 ENCOUNTER — Ambulatory Visit: Payer: Medicare Other

## 2022-05-11 DIAGNOSIS — M5440 Lumbago with sciatica, unspecified side: Secondary | ICD-10-CM

## 2022-05-12 ENCOUNTER — Other Ambulatory Visit: Payer: Self-pay | Admitting: Sports Medicine

## 2022-05-12 ENCOUNTER — Encounter: Payer: Self-pay | Admitting: Sports Medicine

## 2022-05-12 MED ORDER — HYDROCODONE-ACETAMINOPHEN 10-325 MG PO TABS: ORAL_TABLET | ORAL | 0 refills | Status: DC

## 2022-05-13 ENCOUNTER — Encounter: Payer: Self-pay | Admitting: Sports Medicine

## 2022-05-13 ENCOUNTER — Telehealth: Payer: Self-pay | Admitting: Sports Medicine

## 2022-05-13 NOTE — Telephone Encounter (Signed)
  I spoke with Isaiah Price on the phone today after reviewing MRI findings of his lumbar spine.  At L5-S1, he has severe proximal left foraminal stenosis.  This correlates with his clinical exam.  He has had good success with cervical ESI's in the past so we will order a lumbar ESI.  I have also refilled his Norco to take as needed for severe pain.  If he continues to have significant pain despite lumbar ESI's then consultation with neurosurgery would be the neck step.  He understands this plan of care and is in agreement with it.  This note was dictated using Dragon naturally speaking software and may contain errors in syntax, spelling, or content which have not been identified prior to signing this note.

## 2022-05-14 ENCOUNTER — Other Ambulatory Visit: Payer: Self-pay | Admitting: *Deleted

## 2022-05-14 ENCOUNTER — Encounter: Payer: Self-pay | Admitting: Sports Medicine

## 2022-05-14 DIAGNOSIS — M5440 Lumbago with sciatica, unspecified side: Secondary | ICD-10-CM

## 2022-05-17 ENCOUNTER — Other Ambulatory Visit: Payer: Self-pay | Admitting: Sports Medicine

## 2022-05-17 DIAGNOSIS — M5440 Lumbago with sciatica, unspecified side: Secondary | ICD-10-CM

## 2022-05-19 ENCOUNTER — Ambulatory Visit
Admission: RE | Admit: 2022-05-19 | Discharge: 2022-05-19 | Disposition: A | Discharge status: Home / Self Care | Payer: Medicare Other | Source: Ambulatory Visit | Attending: Sports Medicine | Admitting: Sports Medicine

## 2022-05-19 DIAGNOSIS — M5440 Lumbago with sciatica, unspecified side: Secondary | ICD-10-CM

## 2022-05-19 MED ORDER — IOPAMIDOL (ISOVUE-M 200) INJECTION 41%
1.0000 mL | Freq: Once | INTRAMUSCULAR | Status: AC
  Administered 2022-05-19: 1 mL via EPIDURAL

## 2022-05-19 MED ORDER — METHYLPREDNISOLONE ACETATE 40 MG/ML INJ SUSP (RADIOLOG
80.0000 mg | Freq: Once | INTRAMUSCULAR | Status: AC
  Administered 2022-05-19: 80 mg via EPIDURAL

## 2022-05-19 NOTE — Discharge Instructions (Signed)

## 2022-05-24 ENCOUNTER — Encounter: Payer: Self-pay | Admitting: Sports Medicine

## 2022-05-24 DIAGNOSIS — G8929 Other chronic pain: Secondary | ICD-10-CM

## 2022-05-26 ENCOUNTER — Other Ambulatory Visit: Payer: Self-pay | Admitting: Sports Medicine

## 2022-05-26 MED ORDER — HYDROCODONE-ACETAMINOPHEN 10-325 MG PO TABS: ORAL_TABLET | ORAL | 0 refills | Status: DC

## 2022-05-28 ENCOUNTER — Other Ambulatory Visit: Payer: Self-pay | Admitting: Sports Medicine

## 2022-05-28 DIAGNOSIS — G8929 Other chronic pain: Secondary | ICD-10-CM

## 2022-06-03 ENCOUNTER — Ambulatory Visit
Admission: RE | Admit: 2022-06-03 | Discharge: 2022-06-03 | Disposition: A | Discharge status: Home / Self Care | Payer: Medicare Other | Source: Ambulatory Visit | Attending: Sports Medicine | Admitting: Sports Medicine

## 2022-06-03 DIAGNOSIS — G8929 Other chronic pain: Secondary | ICD-10-CM

## 2022-06-03 MED ORDER — METHYLPREDNISOLONE ACETATE 40 MG/ML INJ SUSP (RADIOLOG
80.0000 mg | Freq: Once | INTRAMUSCULAR | Status: AC
  Administered 2022-06-03: 80 mg via EPIDURAL

## 2022-06-03 MED ORDER — IOPAMIDOL (ISOVUE-M 200) INJECTION 41%
1.0000 mL | Freq: Once | INTRAMUSCULAR | Status: AC
  Administered 2022-06-03: 1 mL via EPIDURAL

## 2022-06-03 NOTE — Discharge Instructions (Signed)

## 2022-06-10 ENCOUNTER — Ambulatory Visit (INDEPENDENT_AMBULATORY_CARE_PROVIDER_SITE_OTHER): Payer: Medicare Other | Admitting: Sports Medicine

## 2022-06-10 VITALS — BP 99/85 | Ht 76.0 in | Wt 206.0 lb

## 2022-06-10 DIAGNOSIS — M5416 Radiculopathy, lumbar region: Secondary | ICD-10-CM

## 2022-06-10 MED ORDER — PREDNISONE 10 MG PO TABS: ORAL_TABLET | ORAL | 0 refills | Status: DC

## 2022-06-10 MED ORDER — HYDROCODONE-ACETAMINOPHEN 10-325 MG PO TABS: ORAL_TABLET | ORAL | 0 refills | Status: DC

## 2022-06-10 NOTE — Progress Notes (Signed)
   Subjective:    Patient ID: Isaiah Price, male    DOB: 05-09-1951, 71 y.o.   MRN: [id number]  HPI  Isaiah Price presents today for follow-up on left leg radiculopathy secondary to severe proximal left foraminal stenosis at L5-S1.  He has undergone 2 lumbar ESI's.  The first ESI which was done at the L5-S1 level provided him with 2 days of symptom relief.  Otherwise, his pain has returned.  He is medicating with hydrocodone and tramadol.  These do help temporarily.  He has taken prednisone in the past for multiple other injuries and conditions and has had good success with that in the past.    Review of Systems As above    Objective:   Physical Exam  Well-developed, well-nourished.  He is somewhat uncomfortable sitting in the exam room  Neurological exam shows Achilles reflexes to be trace but equal bilaterally.  No atrophy.  Good strength.      Assessment & Plan:   Left leg radiculopathy secondary to severe left sided foraminal stenosis at L5-S1  I believe Isaiah Price will need a surgical decompression.  He is an established patient of Dr. Clarice Pole.  I requested that Dr. Ellene Route see him soon given his level of pain.  In the meantime, I will provide him with a 12-day Sterapred Dosepak as well as a refill on his hydrocodone.  This note was dictated using Dragon naturally speaking software and may contain errors in syntax, spelling, or content which have not been identified prior to signing this note.

## 2022-06-14 ENCOUNTER — Other Ambulatory Visit: Payer: Self-pay | Admitting: Family Medicine

## 2022-06-16 ENCOUNTER — Other Ambulatory Visit: Payer: Self-pay | Admitting: Sports Medicine

## 2022-06-16 MED ORDER — TRAMADOL HCL 50 MG PO TABS: ORAL_TABLET | ORAL | 0 refills | Status: DC

## 2022-06-18 ENCOUNTER — Encounter: Payer: Self-pay | Admitting: Sports Medicine

## 2022-07-12 ENCOUNTER — Encounter: Payer: Self-pay | Admitting: Sports Medicine

## 2022-07-13 ENCOUNTER — Other Ambulatory Visit: Payer: Self-pay | Admitting: Sports Medicine

## 2022-07-13 MED ORDER — HYDROCODONE-ACETAMINOPHEN 10-325 MG PO TABS: ORAL_TABLET | ORAL | 0 refills | Status: DC

## 2022-07-28 ENCOUNTER — Encounter: Payer: Self-pay | Admitting: Sports Medicine

## 2022-09-01 ENCOUNTER — Other Ambulatory Visit: Payer: Self-pay | Admitting: Sports Medicine

## 2022-09-03 ENCOUNTER — Other Ambulatory Visit: Payer: Self-pay | Admitting: Sports Medicine

## 2022-09-03 ENCOUNTER — Encounter: Payer: Self-pay | Admitting: Sports Medicine

## 2022-09-06 ENCOUNTER — Other Ambulatory Visit: Payer: Self-pay | Admitting: Sports Medicine

## 2022-09-06 MED ORDER — TRAMADOL HCL 50 MG PO TABS: ORAL_TABLET | ORAL | 0 refills | Status: DC

## 2022-09-23 ENCOUNTER — Other Ambulatory Visit: Payer: Self-pay | Admitting: Sports Medicine

## 2022-09-23 MED ORDER — HYDROCODONE-ACETAMINOPHEN 10-325 MG PO TABS: ORAL_TABLET | ORAL | 0 refills | Status: DC

## 2022-09-27 ENCOUNTER — Ambulatory Visit (HOSPITAL_COMMUNITY)
Admission: RE | Admit: 2022-09-27 | Discharge: 2022-09-27 | Disposition: A | Discharge status: Home / Self Care | Payer: Medicare Other | Source: Ambulatory Visit | Attending: Cardiology | Admitting: Cardiology

## 2022-09-27 DIAGNOSIS — I77819 Aortic ectasia, unspecified site: Secondary | ICD-10-CM | POA: Diagnosis not present

## 2022-09-27 MED ORDER — IOHEXOL 350 MG/ML SOLN
75.0000 mL | Freq: Once | INTRAVENOUS | Status: AC | PRN
  Administered 2022-09-27: 75 mL via INTRAVENOUS

## 2022-09-30 ENCOUNTER — Ambulatory Visit (INDEPENDENT_AMBULATORY_CARE_PROVIDER_SITE_OTHER): Payer: Medicare Other | Admitting: Sports Medicine

## 2022-09-30 ENCOUNTER — Encounter: Payer: Self-pay | Admitting: Cardiology

## 2022-09-30 ENCOUNTER — Telehealth: Payer: Self-pay

## 2022-09-30 VITALS — BP 108/64 | Ht 76.0 in | Wt 208.0 lb

## 2022-09-30 DIAGNOSIS — M25551 Pain in right hip: Secondary | ICD-10-CM

## 2022-09-30 MED ORDER — PREDNISONE 10 MG PO TABS: ORAL_TABLET | ORAL | 0 refills | Status: DC

## 2022-09-30 NOTE — Progress Notes (Signed)
   Subjective:    Patient ID: Isaiah Price, male    DOB: [birth year], 71 y.o.   MRN: [id number]  HPI chief complaint: Right hip pain  Isaiah Price presents today with lateral right hip pain.  Pain has been present for several weeks.  He is status post lumbar laminectomy and foraminectomy for chronic left leg radiculopathy.  Surgery was in October by Dr. Ellene Route and Isaiah Price has done extremely well postoperatively.  He feels like a lot of his right hip pain may be secondary to deconditioning as he was unable to really get around the way that he would have liked with his chronic leg pain.  He does have some pain in the right lower back to but he localizes it primarily to the iliac crest with radiating pain to the posterior aspect of the greater trochanter.  It can be quite excruciating.    Review of Systems As above    Objective:   Physical Exam  Well-developed, well-nourished.  No acute distress  Right hip: Smooth painless hip range of motion with a negative logroll.  There is tenderness to palpation along the posterior aspect of the right greater trochanter.  Positive Trendelenburg.  4/5 strength with resisted hip abduction bilaterally.  Neurovascularly intact distally.      Assessment & Plan:   Right hip pain likely secondary to gluteus medius tendinopathy Status post lumbar laminectomy and foraminectomy-doing well  I explained the importance of hip strengthening for his diagnosis.  We talked about both home exercises as well as formal physical therapy.  She would like to start with home exercises first.  We'll have him work on hip abductor strength as well as pelvic stabilizer strengthening.  He is no stranger to oral steroid use so we will give him a prescription for a 12-day Dosepak to take as directed.  He has tramadol and hydrocodone to also take if his pain is severe.  If symptoms do not improve with home exercises then reconsider formal physical therapy at that time.  Follow-up for ongoing or  recalcitrant issues.  This note was dictated using Dragon naturally speaking software and may contain errors in syntax, spelling, or content which have not been identified prior to signing this note.

## 2022-09-30 NOTE — Telephone Encounter (Signed)
Call came in from lab to check if patient (at lab) had any orders. Spoke with patient to inform him there are no current lab orders. He a CCTA on 12/11. Patient wants to know if he should keep 12/22 appointment with Dr. Percival Spanish. Explained he can keep appointment for CCTA results, but I would check for him.

## 2022-09-30 NOTE — Telephone Encounter (Signed)
Left message for pt, his appointment is his one year appointment with dr hochrein. Told the patient to keep the appointment so he can get refills. He is to call with question or concerns.

## 2022-10-06 NOTE — Progress Notes (Signed)
Cardiology Office Note   Date:  10/08/2022   ID:  [name], [name] [birth year], MRN [id number]  PCP:  [name], MD  Cardiologist:   None Referring:  [name], MD  Chief Complaint  Patient presents with   Shortness of Breath       History of Present Illness: Isaiah Price is a 71 y.o. male who is referred by [name], MD for evaluation of SOB and tachycardia.  I saw him in 2018.  He had some shortness of breath but had a negative POET (Plain Old Exercise Treadmill).    This is one year follow up.   Since I saw him he has had no new cardiovascular problems.  He is he still gets some shortness of breath with moderate activity which he relates to his pectus excavatum.  He denies any chest pressure, neck or arm discomfort.  He has had no new PND or orthopnea.  He had no new chest discomfort, neck or arm discomfort.  He did have to have back surgery since I saw him.  He had some leg swelling since that time and associated with that.  He has also had an infected bursitis and lost the skin on his right arm.  He apparently had some dehydration mild renal insufficiency associated with this as well.  He said this has resolved.  He is still remaining active.  He does yard work.  With this he has not been having cardiovascular symptoms.  Past Medical History:  Diagnosis Date   Achilles tendinitis, left leg    At risk for sleep apnea    STOP--BANG SCORE= 5  (routed to pt's pcp in epic 11-13-2018)   Cervicogenic headache 06/08/2021   Chronic insomnia 12/31/2015   Chronic neck pain    Congenital pectus excavatum    per pt had pulmonary test ,  60% restrictive by states asymptomatic   DDD (degenerative disc disease), cervical    Epicondylitis, lateral, right    Gait abnormality 06/08/2021   GERD (gastroesophageal reflux disease)    History of exercise intolerance    09-02-2017  ETT,  negative for ischemia   History of pericarditis 1985   HTN (hypertension)     Hypercholesteremia 12/31/2015   Iritis    IT band syndrome 05/06/2014   Lower urinary tract symptoms (LUTS)    Neuropathy    bilateral arms with tingling and numbness at times due to cervical neck   OA (osteoarthritis)    Polyarthralgia    Prostate cancer [facility])    urologist-  dr Alinda Money--- Stage T1c, Gleason 3+4, 06/08/21 recurrent prostate cancer   Rheumatoid arthritis [facility])    rheumatologist-- dr Amil Amen--- seropositive,multiple sites   Tendon disorder 2022   torn tendon, right arm   Wears glasses     Past Surgical History:  Procedure Laterality Date   ANTERIOR CERVICAL DECOMP/DISCECTOMY FUSION  1991   dr Sherwood Gambler   C5 -- 7  (per pt fusion failed)   CARPAL TUNNEL RELEASE Right done at same time of [place] fusion   CARPOMETACARPAL ([place]) FUSION OF THUMB Bilateral 2015 and 2014  approx.   CATARACT EXTRACTION W/ INTRAOCULAR LENS  IMPLANT, BILATERAL  2013   LYMPHADENECTOMY Bilateral 11/16/2018   Procedure: LYMPHADENECTOMY, PELVIC;  Surgeon: Raynelle Bring, MD;  Location: [facility];  Service: Urology;  Laterality: Bilateral;   ROBOT ASSISTED LAPAROSCOPIC RADICAL PROSTATECTOMY N/A 11/16/2018   Procedure: XI ROBOTIC ASSISTED LAPAROSCOPIC RADICAL PROSTATECTOMY LEVEL 2;  Surgeon: Raynelle Bring, MD;  Location: [facility];  Service: Urology;  Laterality: N/A;   SHOULDER SURGERY Left age 38   TONSILLECTOMY  child     Current Outpatient Medications  Medication Sig Dispense Refill   allopurinol (ZYLOPRIM) 100 MG tablet Take 200 mg by mouth daily.     diltiazem (CARDIZEM) 90 MG tablet Take 90 mg by mouth at bedtime.      fluticasone (FLONASE) 50 MCG/ACT nasal spray Place 1 spray into the nose daily as needed for allergies.      folic acid (FOLVITE) 1 MG tablet Take 1 mg by mouth daily.     HYDROcodone-acetaminophen (NORCO) 10-325 MG tablet TAKE 1 TABLET EVERY 6 HOURS AS NEEDED FOR MODERATE TO SEVERE PAIN. 20 tablet 0   inFLIXimab-abda (RENFLEXIS) 100 MG SOLR '8mg'$ /kg Intravenous every 8 weeks      nortriptyline (PAMELOR) 10 MG capsule Take 2 capsules at night 180 capsule 3   omeprazole (PRILOSEC) 40 MG capsule Take 40 mg by mouth daily.      Polyvinyl Alcohol-Povidone (REFRESH OP) Place 1 drop into both eyes 2 (two) times daily as needed (dry eyes).      predniSONE (DELTASONE) 10 MG tablet Use as directed per doctors orders for the next 12 days. 48 tablet 0   rosuvastatin (CRESTOR) 40 MG tablet Take 1 tablet (40 mg total) by mouth daily. 90 tablet 3   traMADol (ULTRAM) 50 MG tablet TAKE (1) TABLET EVERY EIGHT HOURS AS NEEDED FOR PAIN. 60 tablet 0   tretinoin (RETIN-A) 0.05 % cream Apply a thin layer to face nightly     zolpidem (AMBIEN) 10 MG tablet Take 10 mg by mouth at bedtime as needed for sleep.     No current facility-administered medications for this visit.   Facility-Administered Medications Ordered in Other Visits  Medication Dose Route Frequency Provider Last Rate Last Admin   hydrocortisone sodium succinate (SOLU-CORTEF) injection 100 mg  100 mg Intravenous Once Raynelle Bring, MD        Allergies:   Nsaids, Brimonidine, Gabapentin, Meloxicam, Promethazine hcl, and Trazodone    ROS:  Please see the history of present illness.   Otherwise, review of systems are positive for none.   All other systems are reviewed and negative.    PHYSICAL EXAM: VS:  BP 122/78 (BP Location: Left Arm, Patient Position: Sitting, Cuff Size: Normal)   Pulse 90   Ht '6\' 4"'$  (1.93 m)   Wt 211 lb (95.7 kg)   BMI 25.68 kg/m  , BMI Body mass index is 25.68 kg/m. GENERAL:  Well appearing NECK:  No jugular venous distention, waveform within normal limits, carotid upstroke brisk and symmetric, no bruits, no thyromegaly LUNGS:  Clear to auscultation bilaterally CHEST:  Unremarkable HEART:  PMI not displaced or sustained,S1 and S2 within normal limits, no S3, no S4, no clicks, no rubs, no murmurs ABD:  Flat, positive bowel sounds normal in frequency in pitch, no bruits, no rebound, no guarding, no  midline pulsatile mass, no hepatomegaly, no splenomegaly EXT:  2 plus pulses throughout, no edema, no cyanosis no clubbing   EKG:  EKG is  ordered today. The ekg ordered today demonstrates sinus rhythm, rate 90, left axis deviation, left anterior fascicular block, no acute ST-T wave changes.   Recent Labs: No results found for requested labs within last 365 days.    Lipid Panel No results found for: "CHOL", "TRIG", "HDL", "CHOLHDL", "VLDL", "LDLCALC", "LDLDIRECT"    Wt Readings from Last 3  Encounters:  10/08/22 211 lb (95.7 kg)  09/30/22 208 lb (94.3 kg)  06/10/22 206 lb (93.4 kg)      Other studies Reviewed: Additional studies/ records that were reviewed today include:CT Review of the above records demonstrates:  Please see elsewhere in the note.     ASSESSMENT AND PLAN:  SOB:   This is baseline.  He had a negative treadmill some years ago.  He has abnormal pulmonary function testing related to some restriction from his bony abnormalities.  Symptoms are unchanged so I am not considering further cardiovascular testing.   ELEVATED CORONARY CALCIUM: We discussed how this is consistent with coronary plaque but he has no new symptoms.  He needs aggressive risk reduction.  See below.   HTN: He says that his blood pressure is at target.  No change in therapy.    AO ENLARGEMENT:   This was 42 mm on CT in December. I will follow in one year.   RISK REDUCTION: LDL was 121.  HDL 55.  He needed better risk factor management so I will discontinue Lipitor and add Crestor 40 mg daily.  He get a repeat lipid in 3 months.   Current medicines are reviewed at length with the patient today.  The patient does not have concerns regarding medicines.  The following changes have been made:  no change  Labs/ tests ordered today include:   Orders Placed This Encounter  Procedures   Lipid panel   EKG 12-Lead      Disposition:   FU with me in 12 months.    Signed, Minus Breeding, MD   10/08/2022 9:53 AM    [facility]

## 2022-10-08 ENCOUNTER — Encounter: Payer: Self-pay | Admitting: Cardiology

## 2022-10-08 ENCOUNTER — Ambulatory Visit: Payer: Medicare Other | Attending: Cardiology | Admitting: Cardiology

## 2022-10-08 VITALS — BP 122/78 | HR 90 | Ht 76.0 in | Wt 211.0 lb

## 2022-10-08 DIAGNOSIS — R0602 Shortness of breath: Secondary | ICD-10-CM

## 2022-10-08 DIAGNOSIS — I10 Essential (primary) hypertension: Secondary | ICD-10-CM

## 2022-10-08 DIAGNOSIS — R002 Palpitations: Secondary | ICD-10-CM | POA: Diagnosis present

## 2022-10-08 MED ORDER — ROSUVASTATIN CALCIUM 40 MG PO TABS: 40.0000 mg | ORAL_TABLET | Freq: Every day | ORAL | 3 refills | Status: DC

## 2022-10-08 NOTE — Patient Instructions (Signed)
Medication Instructions:  Stop Atorvastatin Start Rosuvastatin 40 mg daily Continue all other medications  *If you need a refill on your cardiac medications before your next appointment, please call your pharmacy*   Lab Work: Lipid Panel in 3 months  Lab order enclosed   Testing/Procedures: None ordered   Follow-Up: At [facility], you and your health needs are our priority.  As part of our continuing mission to provide you with exceptional heart care, we have created designated Provider Care Teams.  These Care Teams include your primary Cardiologist (physician) and Advanced Practice Providers (APPs -  Physician Assistants and Nurse Practitioners) who all work together to provide you with the care you need, when you need it.  We recommend signing up for the patient portal called "MyChart".  Sign up information is provided on this After Visit Summary.  MyChart is used to connect with patients for Virtual Visits (Telemedicine).  Patients are able to view lab/test results, encounter notes, upcoming appointments, etc.  Non-urgent messages can be sent to your provider as well.   To learn more about what you can do with MyChart, go to [email or web address].    Your next appointment:  1 year   Call in Sept to schedule Dec appointment     The format for your next appointment: Office    Provider:  Dr.Hochrein   Important Information About Sugar

## 2022-11-02 ENCOUNTER — Ambulatory Visit (INDEPENDENT_AMBULATORY_CARE_PROVIDER_SITE_OTHER): Payer: Medicare Other | Admitting: Neurology

## 2022-11-02 ENCOUNTER — Encounter: Payer: Self-pay | Admitting: Neurology

## 2022-11-02 VITALS — BP 114/81 | HR 102 | Ht 76.0 in | Wt 212.5 lb

## 2022-11-02 DIAGNOSIS — G4486 Cervicogenic headache: Secondary | ICD-10-CM | POA: Diagnosis not present

## 2022-11-02 MED ORDER — NORTRIPTYLINE HCL 10 MG PO CAPS: ORAL_CAPSULE | ORAL | 3 refills | Status: DC

## 2022-11-02 NOTE — Progress Notes (Signed)
PATIENT: Isaiah Price DOB: 1951-04-17  REASON FOR VISIT: Follow up for cervicogenic headache, gait disorder HISTORY FROM: Patient PRIMARY NEUROLOGIST: Dr. Krista Blue since Dr. Jannifer Franklin retired   HISTORY OF PRESENT ILLNESS: Today 11/02/22 Here today for follow-up. This past year,  had lumbar spine surgery, with Dr. Ellene Route for left sided radiculopathy. Surgery went well. Neck pain is better. Still taking nortriptyline 20 mg at bedtime. Has hydrocodone for chronic pain. Sees cardiology, following thoracic aortic aneurysm. Rides stationary bike 7 miles a day. Since back surgery numbness to left lower leg has continued.  Update 10/29/21 SS: [name] here today for follow-up with history of gait disorder and cervicogenic headache.  On nortriptyline for the neck and headache issues, is much better, neck much less bothersome, taking less hydrocodone.  MRI of the brain and nerve conduction showed no source of gait instability.  Has initially done well with physical therapy, but didn't keep up with his exercises since being treated for prostate cancer. Getting around pretty well, lately few issue if getting up turning to the right, brief feeling of loss of control, then catches it. 80% better in regards to gait since PT. Sports medicine, Dr. Micheline Chapman gives pain medications. Finished radiation for prostate cancer in September, sees Dr. Alinda Money soon. Has RA, loosing some dexterity in the hands.  HISTORY  06/08/2021 Dr. Jannifer Franklin: [name] is a 72 year old right-handed white male with a history of rheumatoid arthritis and a history of prostate cancer.  The patient in the past has had a radical prostatectomy and is now being treated for recurrence of cancer.  He has been placed on medications to block testosterone production and he is now getting external beam radiation.  The patient has been seen through Dr. Thornell Mule from ENT for a vestibulopathy.  The patient has undergone MRI of the brain and EMG and nerve conduction study  evaluation, no source of gait instability was found.  The patient has had blood work as well that was unremarkable.  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  The patient particularly notes that he feels unsteady if he tilts his head backwards.  He has a sensation that he will go backwards at times, he has not had any falls since last seen.  If he closes eyes and tries to walk down the hall, he will always veer to the right.  He has undergone physical therapy with good benefit, but after the physical therapy sessions ended, his walking problems seemed to have worsened.  He has not been able to keep up with the exercises given to him as he is undergoing treatment for his prostate cancer.  He also reports chronic issues with neck pain, he has had surgery on the neck previously.  The patient has cervicogenic headaches coming up from the back of the head occasionally associated with sharp shooting pains.  He has Flexeril to take but he does not take this frequently, he is not on Ultram frequently.  He returns to this office for further evaluation.  REVIEW OF SYSTEMS: Out of a complete 14 system review of symptoms, the patient complains only of the following symptoms, and all other reviewed systems are negative.   See HPI  ALLERGIES: Allergies  Allergen Reactions   Nsaids Other (See Comments)    Causes GI bleeds (numerous times)   Brimonidine Itching and Other (See Comments)    redness Abdominal Pain   Gabapentin Other (See Comments)  Couldn't talk   Meloxicam Other (See Comments)   Promethazine Hcl     Vision changes   Trazodone     Other reaction(s): Other (See Comments) Bladder incontinence    HOME MEDICATIONS: Outpatient Medications Prior to Visit  Medication Sig Dispense Refill   allopurinol (ZYLOPRIM) 100 MG tablet Take 200 mg by mouth daily.     diltiazem (CARDIZEM) 90 MG tablet Take 90 mg by mouth at bedtime.      fluticasone  (FLONASE) 50 MCG/ACT nasal spray Place 1 spray into the nose daily as needed for allergies.      HYDROcodone-acetaminophen (NORCO) 10-325 MG tablet TAKE 1 TABLET EVERY 6 HOURS AS NEEDED FOR MODERATE TO SEVERE PAIN. 20 tablet 0   inFLIXimab-abda (RENFLEXIS) 100 MG SOLR '8mg'$ /kg Intravenous every 8 weeks     omeprazole (PRILOSEC) 40 MG capsule Take 40 mg by mouth daily.      Polyvinyl Alcohol-Povidone (REFRESH OP) Place 1 drop into both eyes 2 (two) times daily as needed (dry eyes).      rosuvastatin (CRESTOR) 40 MG tablet Take 1 tablet (40 mg total) by mouth daily. 90 tablet 3   traMADol (ULTRAM) 50 MG tablet TAKE (1) TABLET EVERY EIGHT HOURS AS NEEDED FOR PAIN. 60 tablet 0   tretinoin (RETIN-A) 0.05 % cream Apply a thin layer to face nightly     zolpidem (AMBIEN) 10 MG tablet Take 10 mg by mouth at bedtime as needed for sleep.     nortriptyline (PAMELOR) 10 MG capsule Take 2 capsules at night 115 capsule 3   folic acid (FOLVITE) 1 MG tablet Take 1 mg by mouth daily.     predniSONE (DELTASONE) 10 MG tablet Use as directed per doctors orders for the next 12 days. 48 tablet 0   Facility-Administered Medications Prior to Visit  Medication Dose Route Frequency Provider Last Rate Last Admin   hydrocortisone sodium succinate (SOLU-CORTEF) injection 100 mg  100 mg Intravenous Once Raynelle Bring, MD        PAST MEDICAL HISTORY: Past Medical History:  Diagnosis Date   Achilles tendinitis, left leg    At risk for sleep apnea    STOP--BANG SCORE= 5  (routed to pt's pcp in epic 11-13-2018)   Cervicogenic headache 06/08/2021   Chronic insomnia 12/31/2015   Chronic neck pain    Congenital pectus excavatum    per pt had pulmonary test ,  60% restrictive by states asymptomatic   DDD (degenerative disc disease), cervical    Epicondylitis, lateral, right    Gait abnormality 06/08/2021   GERD (gastroesophageal reflux disease)    History of exercise intolerance    09-02-2017  ETT,  negative for ischemia    History of pericarditis 1985   HTN (hypertension)    Hypercholesteremia 12/31/2015   Iritis    IT band syndrome 05/06/2014   Lower urinary tract symptoms (LUTS)    Neuropathy    bilateral arms with tingling and numbness at times due to cervical neck   OA (osteoarthritis)    Polyarthralgia    Prostate cancer [facility])    urologist-  dr Alinda Money--- Stage T1c, Gleason 3+4, 06/08/21 recurrent prostate cancer   Rheumatoid arthritis [facility])    rheumatologist-- dr Amil Amen--- seropositive,multiple sites   Tendon disorder 2022   torn tendon, right arm   Wears glasses     PAST SURGICAL HISTORY: Past Surgical History:  Procedure Laterality Date   ANTERIOR CERVICAL DECOMP/DISCECTOMY FUSION  16   dr Sherwood Gambler   C5 --  7  (per pt fusion failed)   CARPAL TUNNEL RELEASE Right done at same time of [place] fusion   CARPOMETACARPAL ([place] FUSION OF THUMB Bilateral 2015 and 2014  approx.   CATARACT EXTRACTION W/ INTRAOCULAR LENS  IMPLANT, BILATERAL  2013   LYMPHADENECTOMY Bilateral 11/16/2018   Procedure: LYMPHADENECTOMY, PELVIC;  Surgeon: Raynelle Bring, MD;  Location: [facility];  Service: Urology;  Laterality: Bilateral;   ROBOT ASSISTED LAPAROSCOPIC RADICAL PROSTATECTOMY N/A 11/16/2018   Procedure: XI ROBOTIC ASSISTED LAPAROSCOPIC RADICAL PROSTATECTOMY LEVEL 2;  Surgeon: Raynelle Bring, MD;  Location: [facility];  Service: Urology;  Laterality: N/A;   SHOULDER SURGERY Left age 65   TONSILLECTOMY  child    FAMILY HISTORY: Family History  Problem Relation Age of Onset   Heart disease Father        Aortic Valve Stenosis, CABG 56   Hypertension Father    Macular degeneration Mother    Heart disease Mother        MVR   Hypertension Mother    Osteosarcoma Paternal Uncle    Osteosarcoma Son     SOCIAL HISTORY: Social History   Socioeconomic History   Marital status: Married    Spouse name: Manuela Schwartz   Number of children: Not on file   Years of education: Not on file   Highest education level: Associate degree:  academic program  Occupational History    Comment: retired  Tobacco Use   Smoking status: Former    Packs/day: 1.00    Years: 25.00    Total pack years: 25.00    Types: Cigarettes    Quit date: 12/13/2011    Years since quitting: 10.8   Smokeless tobacco: Never   Tobacco comments:    quit 1994  Vaping Use   Vaping Use: Never used  Substance and Sexual Activity   Alcohol use: Yes    Comment: occasional   Drug use: Never   Sexual activity: Not on file    Comment: vasectomy  Other Topics Concern   Not on file  Social History Narrative   Lives with wife, Retired from Adult nurse.     Social Determinants of Health   Financial Resource Strain: Not on file  Food Insecurity: Not on file  Transportation Needs: Not on file  Physical Activity: Not on file  Stress: Not on file  Social Connections: Not on file  Intimate Partner Violence: Not on file   PHYSICAL EXAM  Vitals:   11/02/22 1044  BP: 114/81  Pulse: (!) 102  Weight: 212 lb 8 oz (96.4 kg)  Height: '6\' 4"'$  (1.93 m)   Body mass index is 25.87 kg/m.  Generalized: Well developed, in no acute distress   Neurological examination  Mentation: Alert oriented to time, place, history taking. Follows all commands speech and language fluent Cranial nerve II-XII: Pupils were equal round reactive to light. Extraocular movements were full, visual field were full on confrontational test. Facial sensation and strength were normal. Head turning and shoulder shrug  were normal and symmetric. Motor: The motor testing reveals 5 over 5 strength of all 4 extremities. Good symmetric motor tone is noted throughout.  Sensory: decreased soft touch sensation to left lower leg  Coordination: Cerebellar testing reveals good finger-nose-finger and heel-to-shin bilaterally.  Gait and station: Gait is normal.  Reflexes: Deep tendon reflexes are symmetric and normal bilaterally.   DIAGNOSTIC DATA (LABS, IMAGING, TESTING) - I reviewed patient  records, labs, notes, testing and imaging myself where available.  Lab Results  Component  Value Date   WBC 7.7 11/13/2018   HGB 12.8 (L) 11/17/2018   HCT 39.1 11/17/2018   MCV 88.9 11/13/2018   PLT 245 11/13/2018      Component Value Date/Time   NA 138 11/13/2018 1207   K 4.0 11/13/2018 1207   CL 105 11/13/2018 1207   CO2 22 11/13/2018 1207   GLUCOSE 110 (H) 11/13/2018 1207   BUN 18 11/13/2018 1207   CREATININE 0.93 11/13/2018 1207   CREATININE 1.06 12/23/2014 1045   CALCIUM 9.1 11/13/2018 1207   PROT 7.0 01/02/2021 1025   ALBUMIN 4.5 12/23/2014 1045   AST 16 12/23/2014 1045   ALT 29 12/23/2014 1045   ALKPHOS 76 12/23/2014 1045   BILITOT 0.9 12/23/2014 1045   GFRNONAA >60 11/13/2018 1207   GFRAA >60 11/13/2018 1207   No results found for: "CHOL", "HDL", "LDLCALC", "LDLDIRECT", "TRIG", "CHOLHDL" No results found for: "HGBA1C" Lab Results  Component Value Date   VITAMINB12 760 01/02/2021   No results found for: "TSH"  ASSESSMENT AND PLAN 72 y.o. year old male  has a past medical history of Achilles tendinitis, left leg, At risk for sleep apnea, Cervicogenic headache (06/08/2021), Chronic insomnia (12/31/2015), Chronic neck pain, Congenital pectus excavatum, DDD (degenerative disc disease), cervical, Epicondylitis, lateral, right, Gait abnormality (06/08/2021), GERD (gastroesophageal reflux disease), History of exercise intolerance, History of pericarditis (1985), HTN (hypertension), Hypercholesteremia (12/31/2015), Iritis, IT band syndrome (05/06/2014), Lower urinary tract symptoms (LUTS), Neuropathy, OA (osteoarthritis), Polyarthralgia, Prostate cancer ([place]), Rheumatoid arthritis ([place]), Tendon disorder (2022), and Wears glasses. here with:  1.  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  Cervicogenic headache   3.  Rheumatoid arthritis   4.  Prostate cancer   5.  Chronic insomnia, chronic fatigue  -Doing overall well, underwent lumbar  spine surgery this past year  -Continue nortriptyline 20 mg at bedtime, continue benefit in regards to neck pain -PCP can refill going forward, return here as needed  Butler Denmark, AGNP-C, DNP 11/02/2022, 11:19 AM [facility] [address], [place], [place] [address] [phone]

## 2022-11-02 NOTE — Patient Instructions (Signed)
Great to see you today! I will refill your nortriptyline, see if your primary care doctor will refill going forward See you back as needed

## 2022-12-07 ENCOUNTER — Other Ambulatory Visit: Payer: Self-pay | Admitting: Sports Medicine

## 2022-12-09 ENCOUNTER — Other Ambulatory Visit: Payer: Self-pay | Admitting: Sports Medicine

## 2022-12-10 ENCOUNTER — Encounter: Payer: Self-pay | Admitting: Sports Medicine

## 2022-12-13 ENCOUNTER — Other Ambulatory Visit: Payer: Self-pay | Admitting: Sports Medicine

## 2022-12-13 MED ORDER — TRAMADOL HCL 50 MG PO TABS: ORAL_TABLET | ORAL | 0 refills | Status: DC

## 2022-12-30 ENCOUNTER — Encounter: Payer: Self-pay | Admitting: Sports Medicine

## 2022-12-30 ENCOUNTER — Other Ambulatory Visit: Payer: Self-pay | Admitting: Sports Medicine

## 2022-12-30 MED ORDER — HYDROCODONE-ACETAMINOPHEN 10-325 MG PO TABS: ORAL_TABLET | ORAL | 0 refills | Status: DC

## 2023-01-04 ENCOUNTER — Encounter: Payer: Self-pay | Admitting: Cardiology

## 2023-03-15 ENCOUNTER — Other Ambulatory Visit: Payer: Self-pay | Admitting: Sports Medicine

## 2023-03-15 ENCOUNTER — Encounter: Payer: Self-pay | Admitting: Sports Medicine

## 2023-03-16 ENCOUNTER — Other Ambulatory Visit: Payer: Self-pay | Admitting: Sports Medicine

## 2023-03-16 MED ORDER — HYDROCODONE-ACETAMINOPHEN 10-325 MG PO TABS: ORAL_TABLET | ORAL | 0 refills | Status: DC

## 2023-03-22 ENCOUNTER — Ambulatory Visit (INDEPENDENT_AMBULATORY_CARE_PROVIDER_SITE_OTHER): Payer: Medicare Other | Admitting: Sports Medicine

## 2023-03-22 VITALS — BP 124/82 | Ht 76.0 in | Wt 210.0 lb

## 2023-03-22 DIAGNOSIS — M545 Low back pain, unspecified: Secondary | ICD-10-CM

## 2023-03-22 MED ORDER — TRAMADOL HCL 50 MG PO TABS: ORAL_TABLET | ORAL | 0 refills | Status: DC

## 2023-03-22 MED ORDER — PREDNISONE 10 MG PO TABS: ORAL_TABLET | ORAL | 0 refills | Status: DC

## 2023-03-22 NOTE — Progress Notes (Signed)
   Subjective:    Patient ID: Isaiah Price, male    DOB: 08-Jul-1951, 72 y.o.   MRN: [id number]  HPI chief complaint: Low back pain  Isaiah Price presents today with diffuse low back pain that began a couple of weeks ago.  He is status post L5-S1 foraminectomy and laminectomy done by Dr. Danielle Dess back in October.  He has done well postoperatively.  He denies any radiating pain into his legs other than some chronic fatigue and heaviness which has been present since surgery.  His main pain is diffuse low back pain which is much worse with standing.  Pain improves with sitting.  He has recently returned to the gym for some strength training and wonders if that may be contributing.    Review of Systems As above    Objective:   Physical Exam  Well-developed, well-nourished.  No acute distress  Patient is much more comfortable sitting in the exam room than standing.  He does have some weakness of his left great toe extenders when compared to the right but this may be chronic.  He also has chronic numbness across the dorsum of his foot and great toe on the left.      Assessment & Plan:   Low back pain status post L5-S1 foraminectomy and laminectomy  In addition to his low back pain, he continues to suffer greatly with rheumatoid arthritis in his hands.  He has done well with steroid Dosepaks in the past and I think it would be beneficial for both his low back and his hands.  We will do a 12-day Dosepak with instructions to take as directed.  I have refilled his tramadol to take as needed and also recently refilled his Norco.  I have encouraged him to contact Dr. Verlee Rossetti office for instructions on what is appropriate as far as gym activities given his recent back surgery.  I want Isaiah Price to stay active in the gym but there may be certain exercises or motions that should be restricted due to his recent surgery.  He will touch base with me again once he hears back from Dr. Verlee Rossetti office.  This note was dictated  using Dragon naturally speaking software and may contain errors in syntax, spelling, or content which have not been identified prior to signing this note.

## 2023-05-11 ENCOUNTER — Other Ambulatory Visit: Payer: Self-pay | Admitting: Sports Medicine

## 2023-05-19 ENCOUNTER — Encounter: Payer: Self-pay | Admitting: Sports Medicine

## 2023-05-20 ENCOUNTER — Other Ambulatory Visit: Payer: Self-pay | Admitting: Sports Medicine

## 2023-05-20 MED ORDER — HYDROCODONE-ACETAMINOPHEN 10-325 MG PO TABS: ORAL_TABLET | ORAL | 0 refills | Status: DC

## 2023-05-31 ENCOUNTER — Other Ambulatory Visit: Payer: Self-pay | Admitting: Sports Medicine

## 2023-05-31 ENCOUNTER — Ambulatory Visit (INDEPENDENT_AMBULATORY_CARE_PROVIDER_SITE_OTHER): Payer: Medicare Other | Admitting: Sports Medicine

## 2023-05-31 VITALS — BP 124/84 | Ht 76.0 in | Wt 210.0 lb

## 2023-05-31 DIAGNOSIS — M720 Palmar fascial fibromatosis [Dupuytren]: Secondary | ICD-10-CM

## 2023-05-31 DIAGNOSIS — M0579 Rheumatoid arthritis with rheumatoid factor of multiple sites without organ or systems involvement: Secondary | ICD-10-CM | POA: Diagnosis present

## 2023-05-31 MED ORDER — PREDNISONE 10 MG PO TABS: ORAL_TABLET | ORAL | 0 refills | Status: DC

## 2023-05-31 NOTE — Progress Notes (Signed)
   Subjective:    Patient ID: Isaiah Price, male    DOB: 1951/03/09, 72 y.o.   MRN: [id number]  HPI  Isaiah Price presents to day for follow-up.  Unfortunately, he is still experiencing low back pain.  He recently saw Dr. Danielle Dess and it sounds like he has had a little bit more slippage of his spondylolisthesis.  Dr. Danielle Dess has recommended a combination of a lumbar ESI and physical therapy.  He is also having bilateral hand pain.  He has been seeing the same rheumatologist for years but would like a second opinion.  He states he is unable to hold or grip things effectively.    Review of Systems As directed    Objective:   Physical Exam  Physical exam of both hands shows swelling and synovitis in multiple joints.  He is unable to make a complete fist with either hand.  He also has some Dupuytren's contractures which are tender to palpation.      Assessment & Plan:   Lumbar spondylolisthesis Dupuytren's contractures Rheumatoid arthritis  He will continue under the care of Dr. Danielle Dess for his lumbar spondylolisthesis.  I will refer him to Dr. Amanda Pea to discuss treatment of his Dupuytren's contractures.  He has seen him previously for bilateral CMC osteoarthritis.  I have also recommended a referral to Dr. Orest Dikes for his rheumatoid arthritis.  In the meantime, since he has responded well to oral steroids in the past, I will give him a 12-day Dosepak to take as directed.  This note was dictated using Dragon naturally speaking software and may contain errors in syntax, spelling, or content which have not been identified prior to signing this note.

## 2023-06-03 ENCOUNTER — Encounter: Payer: Self-pay | Admitting: Sports Medicine

## 2023-06-03 ENCOUNTER — Other Ambulatory Visit: Payer: Self-pay | Admitting: Family Medicine

## 2023-06-03 MED ORDER — TRAMADOL HCL 50 MG PO TABS: 50.0000 mg | ORAL_TABLET | Freq: Three times a day (TID) | ORAL | 0 refills | Status: DC | PRN

## 2023-06-03 NOTE — Progress Notes (Signed)
Refilling for Dr. Margaretha Sheffield who is out of the office.  Database reviewed.

## 2023-08-09 ENCOUNTER — Encounter: Payer: Self-pay | Admitting: Sports Medicine

## 2023-08-09 ENCOUNTER — Ambulatory Visit (INDEPENDENT_AMBULATORY_CARE_PROVIDER_SITE_OTHER): Payer: Medicare Other | Admitting: Sports Medicine

## 2023-08-09 VITALS — BP 108/68 | Ht 76.0 in | Wt 210.0 lb

## 2023-08-09 DIAGNOSIS — M25521 Pain in right elbow: Secondary | ICD-10-CM | POA: Diagnosis present

## 2023-08-09 NOTE — Progress Notes (Signed)
Subjective:    Patient ID: Isaiah Price, male    DOB: 06-26-1951, 72 y.o.   MRN: [id number]  HPI chief complaint: Right elbow pain  [name] presents today with acute right elbow pain that began a week and a half ago while opening a bag of chips.  While opening the chips he felt a rip in the elbow.  Pain is diffuse around the elbow and is improving.  He still notices some weakness in the arm however.  Symptoms are most noticeable with flexing and supinating the elbow.  He did not notice any bruising or swelling.  An MRI of the same elbow done in 2022 showed moderate tendinosis and a partial tear at the insertion of the distal biceps tendon as well as high-grade partial-thickness tearing of the common extensor tendon.    Review of Systems As above    Objective:   Physical Exam  Well-developed, well-nourished.  No acute distress  Right elbow: Good range of motion.  There is no effusion.  There is no soft tissue swelling and no ecchymosis.  No Popeye deformity.  Biceps tendon is palpable with hook testing however there is pain with resisted supination.  Good pain with resisted flexion.  No pain with ECRB testing.  Slightly decreased grip strength.      Assessment & Plan:   Acute right elbow pain likely secondary to partial distal biceps tendon tear Rheumatoid arthritis  Jim's pain is improving and hopefully his function will continue to improve as well.  He likely suffered another partial tear of his distal biceps tendon but luckily I do not see any evidence of complete biceps tendon rupture.  I recommended that he purchase a compression sleeve and wear it when active.  He also has a follow-up appointment next week with Dr. Amanda Pea for an unrelated issue.  I asked him to discuss his elbow at that visit too.  This note was dictated using Dragon naturally speaking software and may contain errors in syntax, spelling, or content which have not been identified prior to signing this note.

## 2023-08-15 ENCOUNTER — Other Ambulatory Visit: Payer: Self-pay | Admitting: Family Medicine

## 2023-08-17 ENCOUNTER — Encounter: Payer: Self-pay | Admitting: Sports Medicine

## 2023-08-17 ENCOUNTER — Telehealth: Payer: Self-pay | Admitting: Cardiology

## 2023-08-17 ENCOUNTER — Other Ambulatory Visit: Payer: Self-pay | Admitting: *Deleted

## 2023-08-17 DIAGNOSIS — I77810 Thoracic aortic ectasia: Secondary | ICD-10-CM

## 2023-08-17 MED ORDER — TRAMADOL HCL 50 MG PO TABS: ORAL_TABLET | ORAL | 0 refills | Status: DC

## 2023-08-17 NOTE — Telephone Encounter (Signed)
Patient came in office on 08/17/2023, stated that Dr. Antoine Poche requested him to get a CT ANGIO Chest AORTA for December 2024. Explain to patient that I didn't have a order in to scheduled . Patient stated that Dr. La Farge Lions requests this test every year.

## 2023-08-18 ENCOUNTER — Other Ambulatory Visit: Payer: Self-pay | Admitting: *Deleted

## 2023-08-18 MED ORDER — TRAMADOL HCL 50 MG PO TABS: ORAL_TABLET | ORAL | 0 refills | Status: DC

## 2023-08-18 NOTE — Telephone Encounter (Signed)
Left message for pt to call, his last CTA was done at [facility] cone.? If he would like to have at [place] imaging to prevent going to the hospital or where he would like the testing done.

## 2023-08-22 NOTE — Telephone Encounter (Signed)
Pt returning nurses phone call. Please advise ?

## 2023-08-23 NOTE — Telephone Encounter (Signed)
Spoke with pt, he is okay doing his CTA at [facility]. Order placed

## 2023-08-26 ENCOUNTER — Other Ambulatory Visit: Payer: Medicare Other

## 2023-08-26 ENCOUNTER — Other Ambulatory Visit: Payer: Self-pay | Admitting: Medical Genetics

## 2023-08-26 DIAGNOSIS — Z006 Encounter for examination for normal comparison and control in clinical research program: Secondary | ICD-10-CM

## 2023-08-29 ENCOUNTER — Other Ambulatory Visit: Payer: Self-pay | Admitting: Sports Medicine

## 2023-08-30 ENCOUNTER — Ambulatory Visit (INDEPENDENT_AMBULATORY_CARE_PROVIDER_SITE_OTHER): Payer: Medicare Other | Admitting: Sports Medicine

## 2023-08-30 ENCOUNTER — Encounter: Payer: Self-pay | Admitting: Sports Medicine

## 2023-08-30 VITALS — BP 110/64 | Ht 76.0 in | Wt 210.0 lb

## 2023-08-30 DIAGNOSIS — M25551 Pain in right hip: Secondary | ICD-10-CM | POA: Diagnosis present

## 2023-08-30 DIAGNOSIS — M7061 Trochanteric bursitis, right hip: Secondary | ICD-10-CM

## 2023-08-30 MED ORDER — METHYLPREDNISOLONE ACETATE 40 MG/ML IJ SUSP
40.0000 mg | Freq: Once | INTRAMUSCULAR | Status: AC
  Administered 2023-08-30: 40 mg via INTRA_ARTICULAR

## 2023-08-30 NOTE — Progress Notes (Signed)
   Subjective:    Patient ID: Isaiah Price, male    DOB: [birth year], 72 y.o.   MRN: [id number]  HPI chief: Right hip pain  Isaiah Price presents today with 6 weeks of lateral right hip pain.  Pain began after a long car ride.  He denies any groin pain.  Pain is worse when going from a seated to a standing position as well as with walking.    Review of Systems As above    Objective:   Physical Exam  Well-developed, well-nourished.  No acute distress  Right hip: Smooth painless hip range of motion with negative logroll.  There is tenderness to palpation directly over the greater trochanter.  Neurovascularly intact distally.      Assessment & Plan:   Right hip greater trochanteric bursitis  Patient's lateral right hip was injected today with cortisone.  He tolerates this without difficulty.  If symptoms persist then consider an ultrasound-guided repeat injection at our [place] office.  Follow-up for ongoing or recalcitrant issues.  Consent obtained and verified. Time-out conducted. Noted no overlying erythema, induration, or other signs of local infection. Skin prepped in a sterile fashion. Topical analgesic spray: Ethyl chloride. Joint: Right hip (greater trochanteric bursa) Needle: 25-gauge 1.5 inch Completed without difficulty. Meds: 3 cc 1% Xylocaine, 1 cc (40 mg) Depo-Medrol  This note was dictated using Dragon naturally speaking software and may contain errors in syntax, spelling, or content which have not been identified prior to signing this note.

## 2023-09-05 ENCOUNTER — Encounter: Payer: Self-pay | Admitting: Sports Medicine

## 2023-09-06 ENCOUNTER — Other Ambulatory Visit: Payer: Self-pay | Admitting: Sports Medicine

## 2023-09-06 MED ORDER — HYDROCODONE-ACETAMINOPHEN 10-325 MG PO TABS: ORAL_TABLET | ORAL | 0 refills | Status: DC

## 2023-09-07 ENCOUNTER — Encounter: Payer: Self-pay | Admitting: Family Medicine

## 2023-09-07 ENCOUNTER — Ambulatory Visit: Payer: Medicare Other | Admitting: Family Medicine

## 2023-09-07 ENCOUNTER — Other Ambulatory Visit: Payer: Self-pay

## 2023-09-07 VITALS — BP 122/76 | Ht 76.0 in | Wt 210.0 lb

## 2023-09-07 DIAGNOSIS — M25551 Pain in right hip: Secondary | ICD-10-CM

## 2023-09-07 DIAGNOSIS — M5431 Sciatica, right side: Secondary | ICD-10-CM

## 2023-09-07 MED ORDER — METHYLPREDNISOLONE ACETATE 40 MG/ML IJ SUSP
40.0000 mg | Freq: Once | INTRAMUSCULAR | Status: AC
  Administered 2023-09-07: 40 mg via INTRA_ARTICULAR

## 2023-09-07 NOTE — Progress Notes (Signed)
Isaiah ACCOMANDO - 72 y.o. male MRN [id number]  Date of birth: [birth year]  PCP: Barbie Banner, MD  Subjective:  No chief complaint on file.  Right hip pain  HPI: Past Medical, Surgical, Social, and Family History Reviewed & Updated per EMR.   Patient is a 72 y.o. male here for aching, moderate-sever pain in the posterior R hip that radiates slightly down the back of the leg, worsens with walking, improves with laying down, and is not associated with numbness, weakness, fevers, chills, or lumbar pain. He received a greater trochanteric steroid injection on 11/12 which gave minimal relief. He notes having increased pain after a long road trip which is unusual for him. He denies any falls or trauma to the area.   Pertinent PMHx: IT band syndrome  Past Surgical History:  Procedure Laterality Date   ANTERIOR CERVICAL DECOMP/DISCECTOMY FUSION  3   dr Newell Coral   C5 -- 7  (per pt fusion failed)   CARPAL TUNNEL RELEASE Right done at same time of CMC fusion   CARPOMETACARPAL (CMC) FUSION OF THUMB Bilateral 2015 and 2014  approx.   CATARACT EXTRACTION W/ INTRAOCULAR LENS  IMPLANT, BILATERAL  2013   LYMPHADENECTOMY Bilateral 11/16/2018   Procedure: LYMPHADENECTOMY, PELVIC;  Surgeon: Heloise Purpura, MD;  Location: [facility];  Service: Urology;  Laterality: Bilateral;   ROBOT ASSISTED LAPAROSCOPIC RADICAL PROSTATECTOMY N/A 11/16/2018   Procedure: XI ROBOTIC ASSISTED LAPAROSCOPIC RADICAL PROSTATECTOMY LEVEL 2;  Surgeon: Heloise Purpura, MD;  Location: [facility];  Service: Urology;  Laterality: N/A;   SHOULDER SURGERY Left age 68   TONSILLECTOMY  child    Allergies  Allergen Reactions   Nsaids Other (See Comments)    Causes GI bleeds (numerous times)   Brimonidine Itching and Other (See Comments)    redness Abdominal Pain   Gabapentin Other (See Comments)    Couldn't talk   Meloxicam Other (See Comments)   Promethazine Hcl     Vision changes   Trazodone     Other reaction(s): Other (See  Comments) Bladder incontinence        Objective:  Physical Exam: VS: BP:122/76  HR: bpm  TEMP: ( )  RESP:   HT:6\' 4"  (193 cm)   WT:210 lb (95.3 kg)  BMI:25.57  Gen: Well developed, NAD, speaks clearly, comfortable in exam room Respiratory: Normal work of breathing on room air, no respiratory distress Skin: No rashes, abrasions, or ecchymosis MSK:  Inspection shows small musculature in the gluteal region Gate is antalgic favoring R Range of motion of the hip is full. Passive range of motion elicits pain over the posterior aspect of the lateral hip with external greater than internal rotation Strength 5/5 in all directions Piriformis test is positive Logroll negative Tenderness palpation to the musculature midway between the ischial tuberosity and the greater trochanter Neuro: No sensory deficits  Limited ultrasound of the right posterior hip The greater trochanter was visualized in short and long axis A piriformis has some thickening with mild hazy hyperechoic changes within the muscle fibers The sciatic nerve is visualized in short and long axis.  There is hypoechoic fluid surrounding the nerve seen in both planes. The ischial bursa is visualized with hypoechoic fluid collection immediately over the tuberosity  Summary: Findings consistent with inflammation surrounding the sciatic nerve and ischial bursitis  Ultrasound and interpretation by Dr. Webb Silversmith and Dr. Christella Hartigan    Assessment & Plan:   Sciatic pain, right - Jim's physical exam and ultrasound findings are consistent with  sciatic nerve pain due to surrounding inflammation.  This is likely secondary to a tight piriformis and marked atrophy of the gluteus muscles. - Injection as below.  The steroid was placed into the ischial bursa which had some inflammation.  Hopefully there will be an adjacent effect with the fluid surrounding the sciatic nerve.  We will monitor for 48-72 hours to assess response to the steroid. - Hip  abduction exercises were given.  Hopefully this will give him some support and relieve the tension of the piriformis muscle. - The patient will follow-up in 4 weeks with Dr. Margaretha Sheffield to reevaluate his progress  Injection of Ischial Bursa Indications: Pain  Procedure Details Verbal and written consent was obtained. Risks, benefits, and alternatives reviewed. Time out was performed prior. The medial aspect overlying the R ischial tuberosity was sterilely prepped with Chloraprep. Ethyl Chloride used for anesthesia. 2 cc of Lidocaine 1% injected with 1 mL of Methylprednisone 40 mg into the right ischial bursa under ultrasound guidance. The bursa was visualized in LAX and SAX and injection was visualized under doppler. Care was taken to avoid involvement of the sciatic nerve. Needle taken to bone to troch bursa, flows easily. Bursa massaged. No bleeding and no complications.  The patient was monitored for 5 minutes and experienced no numbness of the lower extremity.   Needle: 22 gauge spinal needle @depomedrol80 @    Rica Mote MD [facility] Fellow  Addendum:  Patient seen and examined in the office by fellow.   History, exam, plan of care were precepted with me.  U/S images reviewed by me and I was present for procedure.  Agree with findings as documented in fellow note with additions as noted above.  Darene Lamer, DO, CAQSM

## 2023-09-07 NOTE — Assessment & Plan Note (Signed)
-   Jim's physical exam and ultrasound findings are consistent with sciatic nerve pain due to surrounding inflammation.  This is likely secondary to a tight piriformis and marked atrophy of the gluteus muscles. - Injection as below.  The steroid was placed into the ischial bursa which had some inflammation.  Hopefully there will be an adjacent effect with the fluid surrounding the sciatic nerve.  We will monitor for 48-72 hours to assess response to the steroid. - Hip abduction exercises were given.  Hopefully this will give him some support and relieve the tension of the piriformis muscle. - The patient will follow-up in 4 weeks with Dr. Margaretha Sheffield to reevaluate his progress  Injection of Ischial Bursa Indications: Pain  Procedure Details Verbal and written consent was obtained. Risks, benefits, and alternatives reviewed. Time out was performed prior. The medial aspect overlying the R ischial tuberosity was sterilely prepped with Chloraprep. Ethyl Chloride used for anesthesia. 2 cc of Lidocaine 1% injected with 1 mL of Methylprednisone 40 mg into the right ischial bursa under ultrasound guidance. The bursa was visualized in LAX and SAX and injection was visualized under doppler. Care was taken to avoid involvement of the sciatic nerve. Needle taken to bone to troch bursa, flows easily. Bursa massaged. No bleeding and no complications.  The patient was monitored for 5 minutes and experienced no numbness of the lower extremity.   Needle: 22 gauge spinal needle @depomedrol80 @

## 2023-09-22 ENCOUNTER — Ambulatory Visit
Admission: RE | Admit: 2023-09-22 | Discharge: 2023-09-22 | Disposition: A | Discharge status: Home / Self Care | Payer: Medicare Other | Source: Ambulatory Visit | Attending: Cardiology | Admitting: Cardiology

## 2023-09-22 ENCOUNTER — Other Ambulatory Visit (HOSPITAL_COMMUNITY): Payer: Medicare Other

## 2023-09-22 DIAGNOSIS — I77810 Thoracic aortic ectasia: Secondary | ICD-10-CM

## 2023-09-22 MED ORDER — IOPAMIDOL (ISOVUE-370) INJECTION 76%
200.0000 mL | Freq: Once | INTRAVENOUS | Status: AC | PRN
  Administered 2023-09-22: 80 mL via INTRAVENOUS

## 2023-09-26 ENCOUNTER — Encounter: Payer: Self-pay | Admitting: Cardiology

## 2023-10-02 DIAGNOSIS — R931 Abnormal findings on diagnostic imaging of heart and coronary circulation: Secondary | ICD-10-CM | POA: Insufficient documentation

## 2023-10-02 DIAGNOSIS — E785 Hyperlipidemia, unspecified: Secondary | ICD-10-CM | POA: Insufficient documentation

## 2023-10-02 DIAGNOSIS — I7789 Other specified disorders of arteries and arterioles: Secondary | ICD-10-CM | POA: Insufficient documentation

## 2023-10-02 NOTE — Progress Notes (Unsigned)
  Cardiology Office Note:   Date:  10/04/2023  ID:  Isaiah Price, DOB August 05, 1951, MRN [id number] PCP: Barbie Banner, MD  [facility] Providers Cardiologist:  Rollene Rotunda, MD {  History of Present Illness:   Isaiah Price is a 72 y.o. male who is referred by Barbie Banner, MD for evaluation of SOB and tachycardia.  I saw him in 2018.  He had some shortness of breath but had a negative POET (Plain Old Exercise Treadmill).    This is one year follow up.    Since I saw him he has done well.  Unfortunately he has been limited by some orthopedic problems.  He tore his tendons in his upper arms.  He has been bothered by rheumatoid arthritis.  He is at back pain.  Was going to [place] well but has had to stop this.  The patient denies any new symptoms such as chest discomfort, neck or arm discomfort. There has been no new shortness of breath, PND or orthopnea. There have been no reported palpitations, presyncope or syncope.   He had a CT the other day that demonstrated aortic and coronary atherosclerosis that we knew about.  His aorta was 41 mm which was unchanged.  There was questionable nodularity of the liver to suggest possible cirrhosis.  ROS: As stated in the HPI and negative for all other systems.  Studies Reviewed:    EKG:   EKG Interpretation Date/Time:  Tuesday October 04 2023 13:31:28 EST Ventricular Rate:  88 PR Interval:  178 QRS Duration:  90 QT Interval:  384 QTC Calculation: 464 R Axis:   -55  Text Interpretation: Normal sinus rhythm Left anterior fascicular block RSR' or QR pattern in V1 suggests right ventricular conduction delay When compared with ECG of 13-Nov-2018 12:16, No significant change was found Confirmed by Rollene Rotunda ([id number]) on 10/04/2023 1:45:54 PM     Risk Assessment/Calculations:              Physical Exam:   VS:  BP 120/78 (BP Location: Left Arm, Patient Position: Sitting, Cuff Size: Normal)   Pulse 88   Ht 6\' 4"  (1.93 m)   Wt 209 lb  (94.8 kg)   BMI 25.44 kg/m    Wt Readings from Last 3 Encounters:  10/04/23 209 lb (94.8 kg)  09/07/23 210 lb (95.3 kg)  08/30/23 210 lb (95.3 kg)     GEN: Well nourished, well developed in no acute distress NECK: No JVD; no carotid bruits CARDIAC: RRR, no murmurs, rubs, gallops RESPIRATORY:  Clear to auscultation without rales, wheezing or rhonchi  ABDOMEN: Soft, non-tender, non-distended EXTREMITIES:  No edema; No deformity   ASSESSMENT AND PLAN:    ELEVATED CORONARY CALCIUM: He has no new symptoms.  We will continue with aggressive risk reduction for management of this and his aortic atherosclerosis.   HTN: He says that his blood pressure is at target.  No change in therapy.    AO ENLARGEMENT:   This was 41 mm and I will repeat a CT in 2 years.    RISK REDUCTION: LDL was 88 which is down from 121-year before.  We talked about eating more plants.  Otherwise he will remain on the meds as listed.    Current medicines are reviewed at length with the patient today.  The patient does not have concerns regarding medicines.     Follow up with me in 2 years  Signed, Rollene Rotunda, MD

## 2023-10-04 ENCOUNTER — Encounter: Payer: Self-pay | Admitting: Cardiology

## 2023-10-04 ENCOUNTER — Other Ambulatory Visit: Payer: Self-pay

## 2023-10-04 ENCOUNTER — Ambulatory Visit: Payer: Medicare Other | Attending: Cardiology | Admitting: Cardiology

## 2023-10-04 VITALS — BP 120/78 | HR 88 | Ht 76.0 in | Wt 209.0 lb

## 2023-10-04 DIAGNOSIS — I7789 Other specified disorders of arteries and arterioles: Secondary | ICD-10-CM | POA: Diagnosis present

## 2023-10-04 DIAGNOSIS — I10 Essential (primary) hypertension: Secondary | ICD-10-CM

## 2023-10-04 DIAGNOSIS — E785 Hyperlipidemia, unspecified: Secondary | ICD-10-CM | POA: Diagnosis present

## 2023-10-04 DIAGNOSIS — R931 Abnormal findings on diagnostic imaging of heart and coronary circulation: Secondary | ICD-10-CM

## 2023-10-04 NOTE — Patient Instructions (Signed)
Medication Instructions:  No changes.  *If you need a refill on your cardiac medications before your next appointment, please call your pharmacy*   Testing/Procedures: Will be placed on a reminder list to have CT of aorta done in 2 years. Due 08/2025   Follow-Up: At [facility], you and your health needs are our priority.  As part of our continuing mission to provide you with exceptional heart care, we have created designated Provider Care Teams.  These Care Teams include your primary Cardiologist (physician) and Advanced Practice Providers (APPs -  Physician Assistants and Nurse Practitioners) who all work together to provide you with the care you need, when you need it.  We recommend signing up for the patient portal called "MyChart".  Sign up information is provided on this After Visit Summary.  MyChart is used to connect with patients for Virtual Visits (Telemedicine).  Patients are able to view lab/test results, encounter notes, upcoming appointments, etc.  Non-urgent messages can be sent to your provider as well.   To learn more about what you can do with MyChart, go to [email or web address].    Your next appointment:   18 month(s)  Provider:   Rollene Rotunda, MD

## 2023-10-05 ENCOUNTER — Other Ambulatory Visit: Payer: Self-pay | Admitting: Cardiology

## 2023-10-05 ENCOUNTER — Ambulatory Visit: Payer: Medicare Other | Admitting: Family Medicine

## 2023-10-05 VITALS — BP 138/76 | Ht 76.0 in | Wt 210.0 lb

## 2023-10-05 DIAGNOSIS — M5431 Sciatica, right side: Secondary | ICD-10-CM

## 2023-10-05 DIAGNOSIS — M25551 Pain in right hip: Secondary | ICD-10-CM | POA: Diagnosis not present

## 2023-10-05 MED ORDER — PREGABALIN 25 MG PO CAPS: 25.0000 mg | ORAL_CAPSULE | Freq: Two times a day (BID) | ORAL | 0 refills | Status: DC

## 2023-10-05 NOTE — Progress Notes (Unsigned)
Isaiah Price - 72 y.o. male MRN [id number]  Date of birth: 03/11/1951  PCP: Barbie Banner, MD  Subjective:  No chief complaint on file. Right sciatica  HPI: Past Medical, Surgical, Social, and Family History Reviewed & Updated per EMR.   Patient is a 72 y.o. male here for follow up on right sciatic pain, last seen on 09/07/2023.  He got some relief from the injection at the last visit.  However, he continues to have pain that is now localized to the right gluteal region and has intermittent radiation down the right leg.  He denies numbness, weakness, fever, chills, unexplained weight loss.  He has been doing pelvic floor physical therapy with the urology office and has been doing the hip abductor exercises at home but having some difficulty with lateral movement.  He feels like he has gotten some improvement with his pain after the exercises.  Past Medical History:  Diagnosis Date   Achilles tendinitis, left leg    At risk for sleep apnea    STOP--BANG SCORE= 5  (routed to pt's pcp in epic 11-13-2018)   Cervicogenic headache 06/08/2021   Chronic insomnia 12/31/2015   Chronic neck pain    Congenital pectus excavatum    per pt had pulmonary test ,  60% restrictive by states asymptomatic   DDD (degenerative disc disease), cervical    Epicondylitis, lateral, right    Gait abnormality 06/08/2021   GERD (gastroesophageal reflux disease)    History of exercise intolerance    09-02-2017  ETT,  negative for ischemia   History of pericarditis 1985   HTN (hypertension)    Hypercholesteremia 12/31/2015   Iritis    IT band syndrome 05/06/2014   Lower urinary tract symptoms (LUTS)    Neuropathy    bilateral arms with tingling and numbness at times due to cervical neck   OA (osteoarthritis)    Polyarthralgia    Prostate cancer [facility])    urologist-  dr Laverle Patter--- Stage T1c, Gleason 3+4, 06/08/21 recurrent prostate cancer   Rheumatoid arthritis [facility])    rheumatologist-- dr Dierdre Forth---  seropositive,multiple sites   Tendon disorder 2022   torn tendon, right arm   Wears glasses     Current Outpatient Medications on File Prior to Visit  Medication Sig Dispense Refill   allopurinol (ZYLOPRIM) 100 MG tablet Take 200 mg by mouth daily.     diltiazem (CARDIZEM) 90 MG tablet Take 90 mg by mouth at bedtime.      fluticasone (FLONASE) 50 MCG/ACT nasal spray Place 1 spray into the nose daily as needed for allergies.      HYDROcodone-acetaminophen (NORCO) 10-325 MG tablet TAKE 1 TABLET EVERY 6 HOURS AS NEEDED FOR MODERATE TO SEVERE PAIN. 20 tablet 0   nortriptyline (PAMELOR) 10 MG capsule Take 2 capsules at night 180 capsule 3   omeprazole (PRILOSEC) 40 MG capsule Take 40 mg by mouth daily.      Polyvinyl Alcohol-Povidone (REFRESH OP) Place 1 drop into both eyes 2 (two) times daily as needed (dry eyes).      traMADol (ULTRAM) 50 MG tablet TAKE (1) TABLET EVERY EIGHT HOURS AS NEEDED FOR PAIN. 60 tablet 0   tretinoin (RETIN-A) 0.05 % cream Apply a thin layer to face nightly     zolpidem (AMBIEN) 10 MG tablet Take 10 mg by mouth at bedtime as needed for sleep.     Current Facility-Administered Medications on File Prior to Visit  Medication Dose Route Frequency Provider Last Rate Last  Admin   hydrocortisone sodium succinate (SOLU-CORTEF) injection 100 mg  100 mg Intravenous Once Heloise Purpura, MD        Past Surgical History:  Procedure Laterality Date   ANTERIOR CERVICAL DECOMP/DISCECTOMY FUSION  44   dr Newell Coral   C5 -- 7  (per pt fusion failed)   CARPAL TUNNEL RELEASE Right done at same time of CMC fusion   CARPOMETACARPAL (CMC) FUSION OF THUMB Bilateral 2015 and 2014  approx.   CATARACT EXTRACTION W/ INTRAOCULAR LENS  IMPLANT, BILATERAL  2013   LYMPHADENECTOMY Bilateral 11/16/2018   Procedure: LYMPHADENECTOMY, PELVIC;  Surgeon: Heloise Purpura, MD;  Location: [facility];  Service: Urology;  Laterality: Bilateral;   ROBOT ASSISTED LAPAROSCOPIC RADICAL PROSTATECTOMY N/A 11/16/2018    Procedure: XI ROBOTIC ASSISTED LAPAROSCOPIC RADICAL PROSTATECTOMY LEVEL 2;  Surgeon: Heloise Purpura, MD;  Location: [facility];  Service: Urology;  Laterality: N/A;   SHOULDER SURGERY Left age 25   TONSILLECTOMY  child    Allergies  Allergen Reactions   Nsaids Other (See Comments)    Causes GI bleeds (numerous times)   Brimonidine Itching and Other (See Comments)    redness Abdominal Pain   Gabapentin Other (See Comments)    Couldn't talk   Meloxicam Other (See Comments)   Promethazine Hcl     Vision changes   Trazodone     Other reaction(s): Other (See Comments) Bladder incontinence        Objective:  Physical Exam: VS: BP:138/76  HR: bpm  TEMP: ( )  RESP:   HT:6\' 4"  (193 cm)   WT:210 lb (95.3 kg)  BMI:25.57  Gen: NAD, speaks clearly, comfortable in exam room Respiratory: Normal respiratory effort on room air. No signs of distress Skin: No rashes, abrasions, or ecchymosis MSK:  Sit to stand is antalgic favoring the right. Range of motion of the right hip is full. Internal rotation elicits some mild posterior pain External rotation nonpainful Tenderness to palpation over the right gluteal region between the greater trochanter and the ischial tuberosity that reproduces radicular pain No tenderness over the anterior hip joint No tenderness over the SI joint or lumbar spinous processes Piriformis test positive Stork test positive Hop test negative Logroll negative No sensory deficits and reflexes equal bilaterally   Assessment & Plan:   Sciatic pain, right - Isaiah Price responded some to the steroid injection.  His pain is now localized to the right posterior gluteal region.  He is still having some radicular pain shooting down the leg intermittently but no weakness. - We will start a trial of Lyrica.  If he is able to tolerate this and responds well this may give him relief enough to progress with physical therapy. - If he is unable to tolerate the Lyrica (he had  difficulty with gabapentin years ago) he can call us and we will send in a steroid Dosepak. - Physical therapy referral given - We will also get x-rays of the right hip as he does not have any previously on record.  If there is substantial derangement of the hip joint itself we will have him follow-up for injection therapy. - Otherwise we will follow-up in 4-6 weeks.     Rica Mote MD [facility] Sports Medicine Fellow  Addendum:  Patient seen and examined in the office by fellow.   History, exam, plan of care were precepted with me.  Agree with findings as documented in fellow note.  Darene Lamer, DO, CAQSM

## 2023-10-05 NOTE — Assessment & Plan Note (Signed)
-   Isaiah Price responded some to the steroid injection.  His pain is now localized to the right posterior gluteal region.  He is still having some radicular pain shooting down the leg intermittently but no weakness. - We will start a trial of Lyrica.  If he is able to tolerate this and responds well this may give him relief enough to progress with physical therapy. - If he is unable to tolerate the Lyrica (he had difficulty with gabapentin years ago) he can call us and we will send in a steroid Dosepak. - Physical therapy referral given - We will also get x-rays of the right hip as he does not have any previously on record.  If there is substantial derangement of the hip joint itself we will have him follow-up for injection therapy. - Otherwise we will follow-up in 4-6 weeks.

## 2023-10-06 ENCOUNTER — Ambulatory Visit
Admission: RE | Admit: 2023-10-06 | Discharge: 2023-10-06 | Disposition: A | Discharge status: Home / Self Care | Payer: Medicare Other | Source: Ambulatory Visit | Attending: Family Medicine | Admitting: Family Medicine

## 2023-10-06 ENCOUNTER — Encounter: Payer: Self-pay | Admitting: Family Medicine

## 2023-10-06 DIAGNOSIS — M25551 Pain in right hip: Secondary | ICD-10-CM

## 2023-10-06 MED ORDER — PREGABALIN 25 MG PO CAPS: 25.0000 mg | ORAL_CAPSULE | Freq: Two times a day (BID) | ORAL | 0 refills | Status: DC

## 2023-10-07 ENCOUNTER — Other Ambulatory Visit: Payer: Self-pay | Admitting: *Deleted

## 2023-10-07 DIAGNOSIS — I77810 Thoracic aortic ectasia: Secondary | ICD-10-CM

## 2023-10-25 ENCOUNTER — Other Ambulatory Visit: Payer: Self-pay

## 2023-10-25 ENCOUNTER — Encounter: Payer: Self-pay | Admitting: Rehabilitative and Restorative Service Providers"

## 2023-10-25 ENCOUNTER — Ambulatory Visit: Payer: Medicare Other | Attending: Family Medicine | Admitting: Rehabilitative and Restorative Service Providers"

## 2023-10-25 DIAGNOSIS — M5417 Radiculopathy, lumbosacral region: Secondary | ICD-10-CM | POA: Diagnosis not present

## 2023-10-25 DIAGNOSIS — M6281 Muscle weakness (generalized): Secondary | ICD-10-CM | POA: Insufficient documentation

## 2023-10-25 DIAGNOSIS — R252 Cramp and spasm: Secondary | ICD-10-CM

## 2023-10-25 DIAGNOSIS — M25551 Pain in right hip: Secondary | ICD-10-CM | POA: Insufficient documentation

## 2023-10-25 NOTE — Patient Instructions (Signed)
   [facility] Welcome to [facility]! Here you will find all the information you will need regarding your pool therapy. If you have further questions at any time, please call our office at [phone]. After completing your initial evaluation in the [facility] clinic, you may be eligible to complete a portion of your therapy in the pool. A typical week of therapy will consist of 1-2 typical physical therapy visits at our [facility] location and an additional session of therapy in the pool located at the [facility] at [facility]. [address] [place] [address]. The phone number at the pool site is [phone]. Please call this number if you are running late or need to cancel your appointment.  Aquatic therapy will be offered on Wednesday mornings and Friday afternoons. Each session will last approximately 45 minutes. All scheduling and payments for aquatic therapy sessions, including cancelations, will be done through our [facility] location.  To be eligible for aquatic therapy, these criteria must be met: You must be able to independently change in the locker room and get to the pool deck. A caregiver can come with you to help if needed. There are benches for a caregiver to sit on next to the pool. No one with an open wound is permitted in the pool.  Handicap parking is available in the front and there is a drop off option for even closer accessibility. Please arrive 15 minutes prior to your appointment to prepare for your pool session. You must sign in at the front desk upon your arrival. Please be sure to attend to any toileting needs prior to entering the pool. Locker rooms for changing are available.  There is direct access to the pool deck from the locker room. You can lock your belongings in a locker or bring them with you poolside. Your therapist will greet you on the pool deck. There may be other swimmers in the pool at the  same time but your session is one-on-one with the therapist.

## 2023-10-25 NOTE — Therapy (Signed)
 OUTPATIENT PHYSICAL THERAPY LOWER EXTREMITY EVALUATION   Patient Name: Isaiah Price MRN: [id number] DOB:08/22/1951, 73 y.o., male Today's Date: 10/25/2023  END OF SESSION:  PT End of Session - 10/25/23 1406     Visit Number 1    Date for PT Re-Evaluation 12/16/23    Authorization Type Medicare A    Progress Note Due on Visit 10    PT Start Time 1400    PT Stop Time 1440    PT Time Calculation (min) 40 min    Activity Tolerance Patient tolerated treatment well    Behavior During Therapy WFL for tasks assessed/performed             Past Medical History:  Diagnosis Date   Achilles tendinitis, left leg    At risk for sleep apnea    STOP--BANG SCORE= 5  (routed to pt's pcp in epic 11-13-2018)   Cervicogenic headache 06/08/2021   Chronic insomnia 12/31/2015   Chronic neck pain    Congenital pectus excavatum    per pt had pulmonary test ,  60% restrictive by states asymptomatic   DDD (degenerative disc disease), cervical    Epicondylitis, lateral, right    Gait abnormality 06/08/2021   GERD (gastroesophageal reflux disease)    History of exercise intolerance    09-02-2017  ETT,  negative for ischemia   History of pericarditis 1985   HTN (hypertension)    Hypercholesteremia 12/31/2015   Iritis    IT band syndrome 05/06/2014   Lower urinary tract symptoms (LUTS)    Neuropathy    bilateral arms with tingling and numbness at times due to cervical neck   OA (osteoarthritis)    Polyarthralgia    Prostate cancer [facility])    urologist-  dr renda--- Stage T1c, Gleason 3+4, 06/08/21 recurrent prostate cancer   Rheumatoid arthritis [facility])    rheumatologist-- dr mai--- seropositive,multiple sites   Tendon disorder 2022   torn tendon, right arm   Wears glasses    Past Surgical History:  Procedure Laterality Date   ANTERIOR CERVICAL DECOMP/DISCECTOMY FUSION  1991   dr alix   C5 -- 7  (per pt fusion failed)   CARPAL TUNNEL RELEASE Right done at same time of CMC fusion    CARPOMETACARPAL (CMC) FUSION OF THUMB Bilateral 2015 and 2014  approx.   CATARACT EXTRACTION W/ INTRAOCULAR LENS  IMPLANT, BILATERAL  2013   LYMPHADENECTOMY Bilateral 11/16/2018   Procedure: LYMPHADENECTOMY, PELVIC;  Surgeon: Renda Glance, MD;  Location: [facility];  Service: Urology;  Laterality: Bilateral;   ROBOT ASSISTED LAPAROSCOPIC RADICAL PROSTATECTOMY N/A 11/16/2018   Procedure: XI ROBOTIC ASSISTED LAPAROSCOPIC RADICAL PROSTATECTOMY LEVEL 2;  Surgeon: Renda Glance, MD;  Location: [facility];  Service: Urology;  Laterality: N/A;   SHOULDER SURGERY Left age 69   TONSILLECTOMY  child   Patient Active Problem List   Diagnosis Date Noted   Dyslipidemia 10/02/2023   Aortic root enlargement (HCC) 10/02/2023   Elevated coronary artery calcium  score 10/02/2023   Sciatic pain, right 09/07/2023   Palpitations 09/27/2021   Cervicogenic headache 06/08/2021   Gait abnormality 06/08/2021   Prostate cancer (HCC) 11/16/2018   Malignant neoplasm of prostate (HCC) 09/18/2018   Daytime somnolence 08/04/2017   Dyspnea on exertion 08/04/2017   Exercise intolerance 08/04/2017   Need for influenza vaccination 07/01/2017   Ulnar neuropathy at elbow, 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   Essential hypertension 01/12/2017   Groin strain 11/18/2016   Irregular bowel habits 11/18/2016   Rheumatoid arthritis (HCC) 01/22/2016  Chronic insomnia 12/31/2015   Chronic pain 12/31/2015   Esophageal reflux 12/31/2015   Hypercholesteremia 12/31/2015   Chronic arthritis 12/31/2015   Wrist pain, right 12/01/2015   IT band syndrome 05/06/2014   Cervical radiculopathy 07/14/2011   Arthritis pain, hand 06/02/2011   Osteoarthritis of CMC joint of thumb 06/02/2011    PCP: Tanda Prentice DEL, MD  REFERRING PROVIDER: Janet Lonni BRAVO, MD  REFERRING DIAG: [phone] (ICD-10-CM) - Right hip pain  THERAPY DIAG:  Muscle weakness (generalized) - Plan: PT plan of care cert/re-cert  Pain in right hip - Plan: PT plan of care  cert/re-cert  Radiculopathy, lumbosacral region - Plan: PT plan of care cert/re-cert  Cramp and spasm - Plan: PT plan of care cert/re-cert  Rationale for Evaluation and Treatment: Rehabilitation  ONSET DATE: October 2024  SUBJECTIVE:   SUBJECTIVE STATEMENT: Pt states that he had back surgery in October 2023, and he was doing well.  States that he had a few issues, but he followed up with Dr Colon and he was given a spinal injection prior to a trip to [place].  Patient reports driving a car without cruise control and he was having to use his right foot a lot during the drive.  States that he has been having some increased pain since that time.  PERTINENT HISTORY: RA, IT band syndrome, ACDF of C5-7 in 1991, Lumbar Laminectomy L5-S1 October 2023  PAIN:  Are you having pain? Yes: NPRS scale: 3-9/10 Pain location: right buttock and hip Pain description: shooting, sharp, sometimes aching and radiating down RLE Aggravating factors: standing and walking Relieving factors: sitting  PRECAUTIONS: None  RED FLAGS: None   WEIGHT BEARING RESTRICTIONS: No  FALLS:  Has patient fallen in last 6 months? No  LIVING ENVIRONMENT: Lives with: lives with their spouse Lives in: House/apartment Stairs:  one story home Has following equipment at home: Grab bars  OCCUPATION: retired, formerly a designer, television/film set  PLOF: Independent and Leisure: yard work  PATIENT GOALS: To be able to walk without hurting.  NEXT MD VISIT: Dr Leila on 10/26/2023, Rheumatologist on 11/21/2023  OBJECTIVE:  Note: Objective measures were completed at Evaluation unless otherwise noted.  DIAGNOSTIC FINDINGS:  Right Hip Radiograph on 10/06/2023: FINDINGS: There is no evidence of hip fracture or dislocation. No significant arthropathy. No bony lesions identified. The bony pelvis is unremarkable.  Lumbar MRI on 05/11/2022: IMPRESSION: 1. At L5-S1, severe proximal left foraminal stenosis with potential impingement.  Moderate left subarticular recess stenosis. 2. At L4-L5, moderate canal and bilateral subarticular recess stenosis with mild right foraminal stenosis. 3. At L3-L4, mild canal stenosis with mild right foraminal stenosis.  PATIENT SURVEYS:  Eval:  FOTO 40 (projected 58 by visit 15)  COGNITION: Overall cognitive status: Within functional limits for tasks assessed     SENSATION: Reports some numbness and tingling down his right leg, especially with prolonged standing.  MUSCLE LENGTH: Hamstrings: tightness noted bilat  POSTURE: rounded shoulders, forward head, and flexed trunk   PALPATION: Tenderness to palpation primarily right hip, but also some in his lumbar region  LOWER EXTREMITY ROM:  Eval:  WFL with some pain at end range  LOWER EXTREMITY MMT:  Eval:   Right hip strength grossly 4-/5 Right hamstring strength is 4/5 Right quad strength is 5-/5 Left LE strength is WFL   FUNCTIONAL TESTS:  Eval: 5 times sit to stand: 12.30 sec Timed up and go (TUG): 12.57 sec 3 minute walk test: 537 ft  GAIT: Distance walked: >500 ft  Assistive device utilized: None Level of assistance: Complete Independence Comments: Patient with antalgic gait with decreased stance time on right LE                                                                                                                                TODAY'S TREATMENT  DATE: 10/25/2023  Issued HEP and reviewed role of PT 3 minute walk test   PATIENT EDUCATION:  Education details: Issued HEP Person educated: Patient Education method: Explanation, Facilities Manager, and Handouts Education comprehension: verbalized understanding  HOME EXERCISE PROGRAM: Access Code: [email or web address] URL: [email or web address] Date: 10/25/2023 Prepared by: Jarrell Aqsa Sensabaugh  Exercises - Seated Scapular Retraction  - 1 x daily - 7 x weekly - 2 sets - 10 reps - Seated Hamstring Stretch  - 1 x daily - 7 x weekly - 2 reps - 20 sec hold -  Clamshell  - 1 x daily - 7 x weekly - 2 sets - 10 reps - Supine Lower Trunk Rotation  - 1 x daily - 7 x weekly - 1 sets - 10 reps - Supine Pelvic Tilt  - 1 x daily - 7 x weekly - 2 sets - 10 reps - Supine Piriformis Stretch with Foot on Ground  - 1 x daily - 7 x weekly - 2 reps - 20 sec hold  ASSESSMENT:  CLINICAL IMPRESSION: Patient is a 73 y.o. male who was seen today for physical therapy evaluation and treatment for right hip pain. Patient states that since a long drive to [place], he has had increased pain in his right hip and radiating down his leg.  Patient presents to skilled PT with increased pain, muscle spasms, muscle weakness, decreased flexibility, and difficulty walking.  Patient would benefit from skilled PT to address his functional impairments to allow him to return to decreased pain with ambulation and to be able to perform yard work again.  OBJECTIVE IMPAIRMENTS: decreased balance, difficulty walking, decreased strength, increased muscle spasms, impaired flexibility, postural dysfunction, and pain.   ACTIVITY LIMITATIONS: lifting, bending, standing, and squatting  PARTICIPATION LIMITATIONS: driving, community activity, and yard work  PERSONAL FACTORS: Past/current experiences, Time since onset of injury/illness/exacerbation, and 3+ comorbidities: RA, IT band syndrome, Hx of cervical and lumbar surgeries  are also affecting patient's functional outcome.   REHAB POTENTIAL: Good  CLINICAL DECISION MAKING: Evolving/moderate complexity  EVALUATION COMPLEXITY: Moderate   GOALS: Goals reviewed with patient? Yes  SHORT TERM GOALS: Target date: 11/18/2023 Patient will be independent with initial HEP. Baseline: Goal status: INITIAL  2.  Patient will report at least a 25% improvement in symptoms since initial evaluation. Baseline:  Goal status: INITIAL   LONG TERM GOALS: Target date: 12/16/2023  Patient will be independent with advanced HEP to allow for self progression after  discharge. Baseline:  Goal status: INITIAL  2.  Patient will increase FOTO to 58 to demonstrate improvements in functional mobility. Baseline: 40 Goal status: INITIAL  3.  Patient will increase right hip strength to [facility] to allow him to perform various tasks to return to being able to perform yard work. Baseline:  Goal status: INITIAL  4.  Patient to report ability to ambulate for at least 15 minutes without increased pain to allow community ambulation. Baseline:  Goal status: INITIAL    PLAN:  PT FREQUENCY: 2x/week  PT DURATION: 8 weeks  PLANNED INTERVENTIONS: 97164- PT Re-evaluation, 97110-Therapeutic exercises, 97530- Therapeutic activity, [id number]- Neuromuscular re-education, 97535- Self Care, 02859- Manual therapy, [id number]- Gait training, [phone]- Canalith repositioning, [id number]- Aquatic Therapy, 97014- Electrical stimulation (unattended), [id number]- Electrical stimulation (manual), 97016- Vasopneumatic device, [id number]- Ultrasound, [id number]- Traction (mechanical), Patient/Family education, Balance training, Stair training, Taping, Dry Needling, Joint mobilization, Joint manipulation, Spinal manipulation, Spinal mobilization, Cryotherapy, Moist heat, and Biofeedback  PLAN FOR NEXT SESSION: Assess and progress HEP as indicated, strengthening, flexibility, manual/dry needling as indicated    Jarrell Laming, PT, DPT 10/25/23, 4:07 PM  [facility] [address] [place], [place] [address] Phone # [phone] Fax [phone]

## 2023-10-27 ENCOUNTER — Encounter: Payer: Self-pay | Admitting: Physical Therapy

## 2023-10-27 ENCOUNTER — Ambulatory Visit: Payer: Medicare Other | Admitting: Physical Therapy

## 2023-10-27 DIAGNOSIS — M6281 Muscle weakness (generalized): Secondary | ICD-10-CM

## 2023-10-27 DIAGNOSIS — M5417 Radiculopathy, lumbosacral region: Secondary | ICD-10-CM

## 2023-10-27 DIAGNOSIS — M25551 Pain in right hip: Secondary | ICD-10-CM | POA: Diagnosis not present

## 2023-10-27 DIAGNOSIS — R252 Cramp and spasm: Secondary | ICD-10-CM

## 2023-10-27 NOTE — Therapy (Signed)
 OUTPATIENT PHYSICAL THERAPY LOWER EXTREMITY TREATMENT   Patient Name: Isaiah Price MRN: [id number] DOB:[birth year], 73 y.o., male Today's Date: 10/27/2023  END OF SESSION:  PT End of Session - 10/27/23 1358     Visit Number 1    Date for PT Re-Evaluation 12/16/23    Authorization Type Medicare/ BCBC    Progress Note Due on Visit 10    PT Start Time 1231    PT Stop Time 1313    PT Time Calculation (min) 42 min    Activity Tolerance Patient tolerated treatment well    Behavior During Therapy WFL for tasks assessed/performed              Past Medical History:  Diagnosis Date   Achilles tendinitis, left leg    At risk for sleep apnea    STOP--BANG SCORE= 5  (routed to pt's pcp in epic 11-13-2018)   Cervicogenic headache 06/08/2021   Chronic insomnia 12/31/2015   Chronic neck pain    Congenital pectus excavatum    per pt had pulmonary test ,  60% restrictive by states asymptomatic   DDD (degenerative disc disease), cervical    Epicondylitis, lateral, right    Gait abnormality 06/08/2021   GERD (gastroesophageal reflux disease)    History of exercise intolerance    09-02-2017  ETT,  negative for ischemia   History of pericarditis 1985   HTN (hypertension)    Hypercholesteremia 12/31/2015   Iritis    IT band syndrome 05/06/2014   Lower urinary tract symptoms (LUTS)    Neuropathy    bilateral arms with tingling and numbness at times due to cervical neck   OA (osteoarthritis)    Polyarthralgia    Prostate cancer [facility])    urologist-  dr renda--- Stage T1c, Gleason 3+4, 06/08/21 recurrent prostate cancer   Rheumatoid arthritis [facility])    rheumatologist-- dr mai--- seropositive,multiple sites   Tendon disorder 2022   torn tendon, right arm   Wears glasses    Past Surgical History:  Procedure Laterality Date   ANTERIOR CERVICAL DECOMP/DISCECTOMY FUSION  1991   dr alix   C5 -- 7  (per pt fusion failed)   CARPAL TUNNEL RELEASE Right done at same time of CMC fusion    CARPOMETACARPAL (CMC) FUSION OF THUMB Bilateral 2015 and 2014  approx.   CATARACT EXTRACTION W/ INTRAOCULAR LENS  IMPLANT, BILATERAL  2013   LYMPHADENECTOMY Bilateral 11/16/2018   Procedure: LYMPHADENECTOMY, PELVIC;  Surgeon: Renda Glance, MD;  Location: [facility];  Service: Urology;  Laterality: Bilateral;   ROBOT ASSISTED LAPAROSCOPIC RADICAL PROSTATECTOMY N/A 11/16/2018   Procedure: XI ROBOTIC ASSISTED LAPAROSCOPIC RADICAL PROSTATECTOMY LEVEL 2;  Surgeon: Renda Glance, MD;  Location: [facility];  Service: Urology;  Laterality: N/A;   SHOULDER SURGERY Left age 26   TONSILLECTOMY  child   Patient Active Problem List   Diagnosis Date Noted   Dyslipidemia 10/02/2023   Aortic root enlargement (HCC) 10/02/2023   Elevated coronary artery calcium  score 10/02/2023   Sciatic pain, right 09/07/2023   Palpitations 09/27/2021   Cervicogenic headache 06/08/2021   Gait abnormality 06/08/2021   Prostate cancer (HCC) 11/16/2018   Malignant neoplasm of prostate (HCC) 09/18/2018   Daytime somnolence 08/04/2017   Dyspnea on exertion 08/04/2017   Exercise intolerance 08/04/2017   Need for influenza vaccination 07/01/2017   Ulnar neuropathy at elbow, 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   Essential hypertension 01/12/2017   Groin strain 11/18/2016   Irregular bowel habits 11/18/2016   Rheumatoid arthritis (HCC) 01/22/2016  Chronic insomnia 12/31/2015   Chronic pain 12/31/2015   Esophageal reflux 12/31/2015   Hypercholesteremia 12/31/2015   Chronic arthritis 12/31/2015   Wrist pain, right 12/01/2015   IT band syndrome 05/06/2014   Cervical radiculopathy 07/14/2011   Arthritis pain, hand 06/02/2011   Osteoarthritis of CMC joint of thumb 06/02/2011    PCP: Tanda Prentice DEL, MD  REFERRING PROVIDER: Janet Lonni BRAVO, MD  REFERRING DIAG: [phone] (ICD-10-CM) - Right hip pain  THERAPY DIAG:  Muscle weakness (generalized)  Pain in right hip  Radiculopathy, lumbosacral region  Cramp and spasm  Rationale for  Evaluation and Treatment: Rehabilitation  ONSET DATE: October 2024  SUBJECTIVE:   SUBJECTIVE STATEMENT: Patient reports he is doing good today. He has been doing a few of his exercises.   From Eval: Pt states that he had back surgery in October 2023, and he was doing well.  States that he had a few issues, but he followed up with Dr Colon and he was given a spinal injection prior to a trip to [place].  Patient reports driving a car without cruise control and he was having to use his right foot a lot during the drive.  States that he has been having some increased pain since that time.  PERTINENT HISTORY: RA, IT band syndrome, ACDF of C5-7 in 1991, Lumbar Laminectomy L5-S1 October 2023  PAIN: 1/92025 Are you having pain? Yes: NPRS scale: 3/10 Pain location: right buttock and hip Pain description: shooting, sharp, sometimes aching and radiating down RLE Aggravating factors: standing and walking Relieving factors: sitting  PRECAUTIONS: None  RED FLAGS: None   WEIGHT BEARING RESTRICTIONS: No  FALLS:  Has patient fallen in last 6 months? No  LIVING ENVIRONMENT: Lives with: lives with their spouse Lives in: House/apartment Stairs:  one story home Has following equipment at home: Grab bars  OCCUPATION: retired, formerly a designer, television/film set  PLOF: Independent and Leisure: yard work  PATIENT GOALS: To be able to walk without hurting.  NEXT MD VISIT: Dr Leila on 10/26/2023, Rheumatologist on 11/21/2023  OBJECTIVE:  Note: Objective measures were completed at Evaluation unless otherwise noted.  DIAGNOSTIC FINDINGS:  Right Hip Radiograph on 10/06/2023: FINDINGS: There is no evidence of hip fracture or dislocation. No significant arthropathy. No bony lesions identified. The bony pelvis is unremarkable.  Lumbar MRI on 05/11/2022: IMPRESSION: 1. At L5-S1, severe proximal left foraminal stenosis with potential impingement. Moderate left subarticular recess stenosis. 2. At L4-L5,  moderate canal and bilateral subarticular recess stenosis with mild right foraminal stenosis. 3. At L3-L4, mild canal stenosis with mild right foraminal stenosis.  PATIENT SURVEYS:  Eval:  FOTO 40 (projected 58 by visit 15)  COGNITION: Overall cognitive status: Within functional limits for tasks assessed     SENSATION: Reports some numbness and tingling down his right leg, especially with prolonged standing.  MUSCLE LENGTH: Hamstrings: tightness noted bilat  POSTURE: rounded shoulders, forward head, and flexed trunk   PALPATION: Tenderness to palpation primarily right hip, but also some in his lumbar region  LOWER EXTREMITY ROM:  Eval:  WFL with some pain at end range  LOWER EXTREMITY MMT:  Eval:   Right hip strength grossly 4-/5 Right hamstring strength is 4/5 Right quad strength is 5-/5 Left LE strength is WFL   FUNCTIONAL TESTS:  Eval: 5 times sit to stand: 12.30 sec Timed up and go (TUG): 12.57 sec 3 minute walk test: 537 ft  GAIT: Distance walked: >500 ft Assistive device utilized: None Level of assistance: Complete  Independence Comments: Patient with antalgic gait with decreased stance time on right LE                                                                                                                                TODAY'S TREATMENT  DATE:  10/27/2023 NuStep Level 3 6 mins- PT present to discuss status Seated Scap Retraction x 10 Seated hamstring stretch 2 x 30 bilateral  Clamshell 2 x 10 Supine LTR x 10 each direction; v/c to not push past pain Supine Piriformis stretch 2 x 30sec bilateral  Supine PPT x 15 v/c for breathing technique  Supine TA contraction + hip flexion x 20 Bridging 2 x 10 Sit to Stand with 5# KB 2 x 10 Side stepping with yellow loop x 3 laps at counter  10/25/2023  Issued HEP and reviewed role of PT 3 minute walk test   PATIENT EDUCATION:  Education details: Issued HEP Person educated: Patient Education method:  Explanation, Facilities Manager, and Handouts Education comprehension: verbalized understanding  HOME EXERCISE PROGRAM: Access Code: [email or web address] URL: [email or web address] Date: 10/25/2023 Prepared by: Jarrell Menke  Exercises - Seated Scapular Retraction  - 1 x daily - 7 x weekly - 2 sets - 10 reps - Seated Hamstring Stretch  - 1 x daily - 7 x weekly - 2 reps - 20 sec hold - Clamshell  - 1 x daily - 7 x weekly - 2 sets - 10 reps - Supine Lower Trunk Rotation  - 1 x daily - 7 x weekly - 1 sets - 10 reps - Supine Pelvic Tilt  - 1 x daily - 7 x weekly - 2 sets - 10 reps - Supine Piriformis Stretch with Foot on Ground  - 1 x daily - 7 x weekly - 2 reps - 20 sec hold  ASSESSMENT:  CLINICAL IMPRESSION: Bodee presents to therapy with mild Rt hip pain today. He verbalized compliance with HEP. Patient required verbal cues for breathing technique with TA activation exercise. Patient tolerated treatment session well and did not verbalize any increased pain or discomfort. Patient required verbal and tactile cues for correct exercise performance. Patient will benefit from skilled PT to address the below impairments and improve overall function.    OBJECTIVE IMPAIRMENTS: decreased balance, difficulty walking, decreased strength, increased muscle spasms, impaired flexibility, postural dysfunction, and pain.   ACTIVITY LIMITATIONS: lifting, bending, standing, and squatting  PARTICIPATION LIMITATIONS: driving, community activity, and yard work  PERSONAL FACTORS: Past/current experiences, Time since onset of injury/illness/exacerbation, and 3+ comorbidities: RA, IT band syndrome, Hx of cervical and lumbar surgeries  are also affecting patient's functional outcome.   REHAB POTENTIAL: Good  CLINICAL DECISION MAKING: Evolving/moderate complexity  EVALUATION COMPLEXITY: Moderate   GOALS: Goals reviewed with patient? Yes  SHORT TERM GOALS: Target date: 11/18/2023 Patient will be independent  with initial HEP. Baseline: Goal status: INITIAL  2.  Patient will report at least a 25% improvement in symptoms since  initial evaluation. Baseline:  Goal status: INITIAL   LONG TERM GOALS: Target date: 12/16/2023  Patient will be independent with advanced HEP to allow for self progression after discharge. Baseline:  Goal status: INITIAL  2.  Patient will increase FOTO to 58 to demonstrate improvements in functional mobility. Baseline: 40 Goal status: INITIAL  3.  Patient will increase right hip strength to [facility] to allow him to perform various tasks to return to being able to perform yard work. Baseline:  Goal status: INITIAL  4.  Patient to report ability to ambulate for at least 15 minutes without increased pain to allow community ambulation. Baseline:  Goal status: INITIAL    PLAN:  PT FREQUENCY: 2x/week  PT DURATION: 8 weeks  PLANNED INTERVENTIONS: 97164- PT Re-evaluation, 97110-Therapeutic exercises, 97530- Therapeutic activity, 97112- Neuromuscular re-education, 97535- Self Care, 02859- Manual therapy, [phone]- Gait training, [phone]- Canalith repositioning, [id number]- Aquatic Therapy, 97014- Electrical stimulation (unattended), [id number]- Electrical stimulation (manual), 97016- Vasopneumatic device, [id number]- Ultrasound, [id number]- Traction (mechanical), Patient/Family education, Balance training, Stair training, Taping, Dry Needling, Joint mobilization, Joint manipulation, Spinal manipulation, Spinal mobilization, Cryotherapy, Moist heat, and Biofeedback  PLAN FOR NEXT SESSION: Assess tolerance to treatment session, step ups; hip flexion stretch ; 3 way stability ball stretch;  ; flexibility, manual/dry needling as indicated     Kristeen Sar, PT 10/27/23 1:59 PM [facility] [address] [place], [place] [address] Phone # [phone] Fax [phone]

## 2023-10-31 ENCOUNTER — Encounter: Payer: Self-pay | Admitting: Family Medicine

## 2023-10-31 ENCOUNTER — Ambulatory Visit: Payer: Medicare Other | Admitting: Physical Therapy

## 2023-10-31 ENCOUNTER — Encounter: Payer: Self-pay | Admitting: Physical Therapy

## 2023-10-31 DIAGNOSIS — R252 Cramp and spasm: Secondary | ICD-10-CM

## 2023-10-31 DIAGNOSIS — M25551 Pain in right hip: Secondary | ICD-10-CM | POA: Diagnosis not present

## 2023-10-31 DIAGNOSIS — M6281 Muscle weakness (generalized): Secondary | ICD-10-CM

## 2023-10-31 DIAGNOSIS — M5417 Radiculopathy, lumbosacral region: Secondary | ICD-10-CM

## 2023-10-31 NOTE — Therapy (Signed)
 OUTPATIENT PHYSICAL THERAPY LOWER EXTREMITY TREATMENT   Patient Name: Isaiah Price MRN: [id number] DOB:25-Aug-1951, 73 y.o., male Today's Date: 10/31/2023  END OF SESSION:  PT End of Session - 10/31/23 1541     Visit Number 2    Date for PT Re-Evaluation 12/16/23    Authorization Type Medicare/ BCBC    Progress Note Due on Visit 10    PT Start Time 1449    PT Stop Time 1535    PT Time Calculation (min) 46 min    Activity Tolerance Patient tolerated treatment well    Behavior During Therapy WFL for tasks assessed/performed               Past Medical History:  Diagnosis Date   Achilles tendinitis, left leg    At risk for sleep apnea    STOP--BANG SCORE= 5  (routed to pt's pcp in epic 11-13-2018)   Cervicogenic headache 06/08/2021   Chronic insomnia 12/31/2015   Chronic neck pain    Congenital pectus excavatum    per pt had pulmonary test ,  60% restrictive by states asymptomatic   DDD (degenerative disc disease), cervical    Epicondylitis, lateral, right    Gait abnormality 06/08/2021   GERD (gastroesophageal reflux disease)    History of exercise intolerance    09-02-2017  ETT,  negative for ischemia   History of pericarditis 1985   HTN (hypertension)    Hypercholesteremia 12/31/2015   Iritis    IT band syndrome 05/06/2014   Lower urinary tract symptoms (LUTS)    Neuropathy    bilateral arms with tingling and numbness at times due to cervical neck   OA (osteoarthritis)    Polyarthralgia    Prostate cancer [facility])    urologist-  dr renda--- Stage T1c, Gleason 3+4, 06/08/21 recurrent prostate cancer   Rheumatoid arthritis [facility])    rheumatologist-- dr mai--- seropositive,multiple sites   Tendon disorder 2022   torn tendon, right arm   Wears glasses    Past Surgical History:  Procedure Laterality Date   ANTERIOR CERVICAL DECOMP/DISCECTOMY FUSION  1991   dr alix   C5 -- 7  (per pt fusion failed)   CARPAL TUNNEL RELEASE Right done at same time of [facility]  fusion   CARPOMETACARPAL ([facility]) FUSION OF THUMB Bilateral 2015 and 2014  approx.   CATARACT EXTRACTION W/ INTRAOCULAR LENS  IMPLANT, BILATERAL  2013   LYMPHADENECTOMY Bilateral 11/16/2018   Procedure: LYMPHADENECTOMY, PELVIC;  Surgeon: Renda Glance, MD;  Location: [facility];  Service: Urology;  Laterality: Bilateral;   ROBOT ASSISTED LAPAROSCOPIC RADICAL PROSTATECTOMY N/A 11/16/2018   Procedure: XI ROBOTIC ASSISTED LAPAROSCOPIC RADICAL PROSTATECTOMY LEVEL 2;  Surgeon: Renda Glance, MD;  Location: [facility];  Service: Urology;  Laterality: N/A;   SHOULDER SURGERY Left age 30   TONSILLECTOMY  child   Patient Active Problem List   Diagnosis Date Noted   Dyslipidemia 10/02/2023   Aortic root enlargement (HCC) 10/02/2023   Elevated coronary artery calcium  score 10/02/2023   Sciatic pain, right 09/07/2023   Palpitations 09/27/2021   Cervicogenic headache 06/08/2021   Gait abnormality 06/08/2021   Prostate cancer (HCC) 11/16/2018   Malignant neoplasm of prostate (HCC) 09/18/2018   Daytime somnolence 08/04/2017   Dyspnea on exertion 08/04/2017   Exercise intolerance 08/04/2017   Need for influenza vaccination 07/01/2017   Ulnar neuropathy at elbow, 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   Essential hypertension 01/12/2017   Groin strain 11/18/2016   Irregular bowel habits 11/18/2016   Rheumatoid arthritis (HCC)  01/22/2016   Chronic insomnia 12/31/2015   Chronic pain 12/31/2015   Esophageal reflux 12/31/2015   Hypercholesteremia 12/31/2015   Chronic arthritis 12/31/2015   Wrist pain, right 12/01/2015   IT band syndrome 05/06/2014   Cervical radiculopathy 07/14/2011   Arthritis pain, hand 06/02/2011   Osteoarthritis of CMC joint of thumb 06/02/2011    PCP: Tanda Prentice DEL, MD  REFERRING PROVIDER: Janet Lonni BRAVO, MD  REFERRING DIAG: [phone] (ICD-10-CM) - Right hip pain  THERAPY DIAG:  Muscle weakness (generalized)  Pain in right hip  Radiculopathy, lumbosacral region  Cramp and  spasm  Rationale for Evaluation and Treatment: Rehabilitation  ONSET DATE: October 2024  SUBJECTIVE:   SUBJECTIVE STATEMENT: Patient reports he is doing okay today. He did his normal exercises on Saturday and he was in increased pain for the rest of the day. He did not do any exercises on Sunday and he has been walking with a limp.  From Eval: Pt states that he had back surgery in October 2023, and he was doing well.  States that he had a few issues, but he followed up with Dr Colon and he was given a spinal injection prior to a trip to [place].  Patient reports driving a car without cruise control and he was having to use his right foot a lot during the drive.  States that he has been having some increased pain since that time.  PERTINENT HISTORY: RA, IT band syndrome, ACDF of C5-7 in 1991, Lumbar Laminectomy L5-S1 October 2023  PAIN: 10/31/2023 Are you having pain? Yes: NPRS scale: 0-5/10 Pain location: right buttock and hip Pain description: shooting, sharp, sometimes aching and radiating down RLE Aggravating factors: standing and walking Relieving factors: sitting  PRECAUTIONS: None  RED FLAGS: None   WEIGHT BEARING RESTRICTIONS: No  FALLS:  Has patient fallen in last 6 months? No  LIVING ENVIRONMENT: Lives with: lives with their spouse Lives in: House/apartment Stairs:  one story home Has following equipment at home: Grab bars  OCCUPATION: retired, formerly a designer, television/film set  PLOF: Independent and Leisure: yard work  PATIENT GOALS: To be able to walk without hurting.  NEXT MD VISIT: Dr Leila on 10/26/2023, Rheumatologist on 11/21/2023  OBJECTIVE:  Note: Objective measures were completed at Evaluation unless otherwise noted.  DIAGNOSTIC FINDINGS:  Right Hip Radiograph on 10/06/2023: FINDINGS: There is no evidence of hip fracture or dislocation. No significant arthropathy. No bony lesions identified. The bony pelvis is unremarkable.  Lumbar MRI on  05/11/2022: IMPRESSION: 1. At L5-S1, severe proximal left foraminal stenosis with potential impingement. Moderate left subarticular recess stenosis. 2. At L4-L5, moderate canal and bilateral subarticular recess stenosis with mild right foraminal stenosis. 3. At L3-L4, mild canal stenosis with mild right foraminal stenosis.  PATIENT SURVEYS:  Eval:  FOTO 40 (projected 58 by visit 15)  COGNITION: Overall cognitive status: Within functional limits for tasks assessed     SENSATION: Reports some numbness and tingling down his right leg, especially with prolonged standing.  MUSCLE LENGTH: Hamstrings: tightness noted bilat  POSTURE: rounded shoulders, forward head, and flexed trunk   PALPATION: Tenderness to palpation primarily right hip, but also some in his lumbar region  LOWER EXTREMITY ROM:  Eval:  WFL with some pain at end range  LOWER EXTREMITY MMT:  Eval:   Right hip strength grossly 4-/5 Right hamstring strength is 4/5 Right quad strength is 5-/5 Left LE strength is WFL   FUNCTIONAL TESTS:  Eval: 5 times sit to stand: 12.30  sec Timed up and go (TUG): 12.57 sec 3 minute walk test: 537 ft  GAIT: Distance walked: >500 ft Assistive device utilized: None Level of assistance: Complete Independence Comments: Patient with antalgic gait with decreased stance time on right LE                                                                                                                                TODAY'S TREATMENT  DATE:  10/31/2023 NuStep Level 3 7 mins- PT present to discuss status Supine hip flexion stretch 2 x 20 bilateral  Supine Figure four stretch 2 x 20 sec bilateral  Supine Hip internal/external stretch x 10 each direction Supine LTR x 10 Supine hamstring stretch with green strap 2 x 30 sec Supine PPT x 10 4 sec holds Supine TA contraction + hip flexion x 10  Bridges 2 x 10 Hip abduction & Extension taps with yellow loop around legs 2 x 10 on Rt   Manual: Addaday to Rt hip to improve circulation and blood flow to gluteals. PT present to monitor status.   10/27/2023 NuStep Level 3 6 mins- PT present to discuss status Seated Scap Retraction x 10 Seated hamstring stretch 2 x 30 bilateral  Clamshell 2 x 10 Supine LTR x 10 each direction; v/c to not push past pain Supine Piriformis stretch 2 x 30sec bilateral  Supine PPT x 15 v/c for breathing technique  Supine TA contraction + hip flexion x 20 Bridging 2 x 10 Sit to Stand with 5# KB 2 x 10 Side stepping with yellow loop x 3 laps at counter  10/25/2023  Issued HEP and reviewed role of PT 3 minute walk test   PATIENT EDUCATION:  Education details: Issued HEP Person educated: Patient Education method: Explanation, Facilities Manager, and Handouts Education comprehension: verbalized understanding  HOME EXERCISE PROGRAM: Access Code: [email or web address] URL: [email or web address] Date: 10/31/2023 Prepared by: Kristeen Sar  Exercises - Seated Scapular Retraction  - 1 x daily - 7 x weekly - 2 sets - 10 reps - Seated Hamstring Stretch  - 1 x daily - 7 x weekly - 2 reps - 20 sec hold - Clamshell  - 1 x daily - 7 x weekly - 2 sets - 10 reps - Supine Lower Trunk Rotation  - 1 x daily - 7 x weekly - 1 sets - 10 reps - Supine Pelvic Tilt  - 1 x daily - 7 x weekly - 2 sets - 10 reps - Supine Piriformis Stretch with Foot on Ground  - 1 x daily - 7 x weekly - 2 reps - 20 sec hold - Supine Bridge  - 1 x daily - 7 x weekly - 2 sets - 10 reps - Supine March  - 1 x daily - 7 x weekly - 2 sets - 10 reps - Hip Flexion Stretch  - 1 x daily - 7 x weekly - 2 sets - 10 reps - Supine Hip  Internal and External Rotation  - 1 x daily - 7 x weekly - 1 sets - 10 reps - 2 hold  ASSESSMENT:  CLINICAL IMPRESSION: Isaiah Price presents to therapy with increased right hip pain. He did his exercises over the weekend and he verbalized having increased pain the next day. Patient was performing exercises correctly and  he did his normal reps and sets. Incorporated more hip stretches today and patient tolerated them well. He required verbal and visual cues for correct exercise performance. Patient responded responded favorably to manual therapy techniques and verbalized decreased pain. Updated HEP to include core progressions and hip stretches. Patient will benefit from skilled PT to address the below impairments and improve overall function.     OBJECTIVE IMPAIRMENTS: decreased balance, difficulty walking, decreased strength, increased muscle spasms, impaired flexibility, postural dysfunction, and pain.   ACTIVITY LIMITATIONS: lifting, bending, standing, and squatting  PARTICIPATION LIMITATIONS: driving, community activity, and yard work  PERSONAL FACTORS: Past/current experiences, Time since onset of injury/illness/exacerbation, and 3+ comorbidities: RA, IT band syndrome, Hx of cervical and lumbar surgeries  are also affecting patient's functional outcome.   REHAB POTENTIAL: Good  CLINICAL DECISION MAKING: Evolving/moderate complexity  EVALUATION COMPLEXITY: Moderate   GOALS: Goals reviewed with patient? Yes  SHORT TERM GOALS: Target date: 11/18/2023 Patient will be independent with initial HEP. Baseline: Goal status: INITIAL  2.  Patient will report at least a 25% improvement in symptoms since initial evaluation. Baseline:  Goal status: INITIAL   LONG TERM GOALS: Target date: 12/16/2023  Patient will be independent with advanced HEP to allow for self progression after discharge. Baseline:  Goal status: INITIAL  2.  Patient will increase FOTO to 58 to demonstrate improvements in functional mobility. Baseline: 40 Goal status: INITIAL  3.  Patient will increase right hip strength to [facility] to allow him to perform various tasks to return to being able to perform yard work. Baseline:  Goal status: INITIAL  4.  Patient to report ability to ambulate for at least 15 minutes without increased  pain to allow community ambulation. Baseline:  Goal status: INITIAL    PLAN:  PT FREQUENCY: 2x/week  PT DURATION: 8 weeks  PLANNED INTERVENTIONS: 97164- PT Re-evaluation, 97110-Therapeutic exercises, 97530- Therapeutic activity, 97112- Neuromuscular re-education, 97535- Self Care, [id number]- Manual therapy, [phone]- Gait training, [phone]- Canalith repositioning, [id number]- Aquatic Therapy, 97014- Electrical stimulation (unattended), [id number]- Electrical stimulation (manual), 97016- Vasopneumatic device, [id number]- Ultrasound, [id number]- Traction (mechanical), Patient/Family education, Balance training, Stair training, Taping, Dry Needling, Joint mobilization, Joint manipulation, Spinal manipulation, Spinal mobilization, Cryotherapy, Moist heat, and Biofeedback  PLAN FOR NEXT SESSION: Assess tolerance to treatment session, step ups ; flexibility, manual/dry needling as indicated     Kristeen Sar, PT 10/31/23 3:42 PM [facility] [address], [address] [place], [place] [address] Phone # [phone] Fax [phone]

## 2023-11-01 MED ORDER — TRAMADOL HCL 50 MG PO TABS: 50.0000 mg | ORAL_TABLET | Freq: Three times a day (TID) | ORAL | 0 refills | Status: DC | PRN

## 2023-11-01 MED ORDER — TRAMADOL HCL 50 MG PO TABS: ORAL_TABLET | ORAL | 0 refills | Status: DC

## 2023-11-01 NOTE — Addendum Note (Signed)
 Addended by: Andi Devon on: 11/01/2023 09:51 AM   Modules accepted: Orders

## 2023-11-02 ENCOUNTER — Encounter: Payer: Self-pay | Admitting: Physical Therapy

## 2023-11-02 ENCOUNTER — Ambulatory Visit: Payer: Medicare Other | Admitting: Physical Therapy

## 2023-11-02 DIAGNOSIS — M6281 Muscle weakness (generalized): Secondary | ICD-10-CM

## 2023-11-02 DIAGNOSIS — R252 Cramp and spasm: Secondary | ICD-10-CM

## 2023-11-02 DIAGNOSIS — M25551 Pain in right hip: Secondary | ICD-10-CM

## 2023-11-02 DIAGNOSIS — M5417 Radiculopathy, lumbosacral region: Secondary | ICD-10-CM

## 2023-11-02 NOTE — Therapy (Signed)
 OUTPATIENT PHYSICAL THERAPY LOWER EXTREMITY TREATMENT   Patient Name: Isaiah Price MRN: [id number] DOB:[birth year], 73 y.o., male Today's Date: 11/02/2023  END OF SESSION:  PT End of Session - 11/02/23 1709     Visit Number 3    Date for PT Re-Evaluation 12/16/23    Authorization Type Medicare/ BCBC    Progress Note Due on Visit 10    PT Start Time 1535    PT Stop Time 1614    PT Time Calculation (min) 39 min    Activity Tolerance Patient tolerated treatment well    Behavior During Therapy WFL for tasks assessed/performed                Past Medical History:  Diagnosis Date   Achilles tendinitis, left leg    At risk for sleep apnea    STOP--BANG SCORE= 5  (routed to pt's pcp in epic 11-13-2018)   Cervicogenic headache 06/08/2021   Chronic insomnia 12/31/2015   Chronic neck pain    Congenital pectus excavatum    per pt had pulmonary test ,  60% restrictive by states asymptomatic   DDD (degenerative disc disease), cervical    Epicondylitis, lateral, right    Gait abnormality 06/08/2021   GERD (gastroesophageal reflux disease)    History of exercise intolerance    09-02-2017  ETT,  negative for ischemia   History of pericarditis 1985   HTN (hypertension)    Hypercholesteremia 12/31/2015   Iritis    IT band syndrome 05/06/2014   Lower urinary tract symptoms (LUTS)    Neuropathy    bilateral arms with tingling and numbness at times due to cervical neck   OA (osteoarthritis)    Polyarthralgia    Prostate cancer [facility])    urologist-  dr Rozanne Corners--- Stage T1c, Gleason 3+4, 06/08/21 recurrent prostate cancer   Rheumatoid arthritis [facility])    rheumatologist-- dr Ebbie Goldmann--- seropositive,multiple sites   Tendon disorder 2022   torn tendon, right arm   Wears glasses    Past Surgical History:  Procedure Laterality Date   ANTERIOR CERVICAL DECOMP/DISCECTOMY FUSION  1991   dr Cipriano Creeks   C5 -- 7  (per pt fusion failed)   CARPAL TUNNEL RELEASE Right done at same time of [facility]  fusion   CARPOMETACARPAL ([facility]) FUSION OF THUMB Bilateral 2015 and 2014  approx.   CATARACT EXTRACTION W/ INTRAOCULAR LENS  IMPLANT, BILATERAL  2013   LYMPHADENECTOMY Bilateral 11/16/2018   Procedure: LYMPHADENECTOMY, PELVIC;  Surgeon: Florencio Hunting, MD;  Location: [facility];  Service: Urology;  Laterality: Bilateral;   ROBOT ASSISTED LAPAROSCOPIC RADICAL PROSTATECTOMY N/A 11/16/2018   Procedure: XI ROBOTIC ASSISTED LAPAROSCOPIC RADICAL PROSTATECTOMY LEVEL 2;  Surgeon: Florencio Hunting, MD;  Location: [facility];  Service: Urology;  Laterality: N/A;   SHOULDER SURGERY Left age 42   TONSILLECTOMY  child   Patient Active Problem List   Diagnosis Date Noted   Dyslipidemia 10/02/2023   Aortic root enlargement (HCC) 10/02/2023   Elevated coronary artery calcium  score 10/02/2023   Sciatic pain, right 09/07/2023   Palpitations 09/27/2021   Cervicogenic headache 06/08/2021   Gait abnormality 06/08/2021   Prostate cancer (HCC) 11/16/2018   Malignant neoplasm of prostate (HCC) 09/18/2018   Daytime somnolence 08/04/2017   Dyspnea on exertion 08/04/2017   Exercise intolerance 08/04/2017   Need for influenza vaccination 07/01/2017   Ulnar neuropathy at elbow, 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   Essential hypertension 01/12/2017   Groin strain 11/18/2016   Irregular bowel habits 11/18/2016   Rheumatoid arthritis (  HCC) 01/22/2016   Chronic insomnia 12/31/2015   Chronic pain 12/31/2015   Esophageal reflux 12/31/2015   Hypercholesteremia 12/31/2015   Chronic arthritis 12/31/2015   Wrist pain, right 12/01/2015   IT band syndrome 05/06/2014   Cervical radiculopathy 07/14/2011   Arthritis pain, hand 06/02/2011   Osteoarthritis of [facility] joint of thumb 06/02/2011    PCP: Tura Gaines, MD  REFERRING PROVIDER: Claybon Cuna, MD  REFERRING DIAG: [phone] (ICD-10-CM) - Right hip pain  THERAPY DIAG:  Muscle weakness (generalized)  Pain in right hip  Radiculopathy, lumbosacral region  Cramp and  spasm  Rationale for Evaluation and Treatment: Rehabilitation  ONSET DATE: October 2024  SUBJECTIVE:   SUBJECTIVE STATEMENT: Patient reports he is doing okay today. He is still having increased right hip pain. He feels numbness currently. He did his HEP and he has increased pain but he does not remember what exercise caused it.  From Eval: Pt states that he had back surgery in October 2023, and he was doing well.  States that he had a few issues, but he followed up with Dr Ellery Guthrie and he was given a spinal injection prior to a trip to [place].  Patient reports driving a car without cruise control and he was having to use his right foot a lot during the drive.  States that he has been having some increased pain since that time.  PERTINENT HISTORY: RA, IT band syndrome, ACDF of C5-7 in 1991, Lumbar Laminectomy L5-S1 October 2023  PAIN: 11/02/2023 Are you having pain? Yes: NPRS scale: 0-2/10 Pain location: right buttock and hip Pain description: shooting, sharp, sometimes aching and radiating down RLE Aggravating factors: standing and walking Relieving factors: sitting  PRECAUTIONS: None  RED FLAGS: None   WEIGHT BEARING RESTRICTIONS: No  FALLS:  Has patient fallen in last 6 months? No  LIVING ENVIRONMENT: Lives with: lives with their spouse Lives in: House/apartment Stairs:  one story home Has following equipment at home: Grab bars  OCCUPATION: retired, formerly a Designer, television/film set  PLOF: Independent and Leisure: yard work  PATIENT GOALS: To be able to walk without hurting.  NEXT MD VISIT: Dr Milton Alpers on 10/26/2023, Rheumatologist on 11/21/2023  OBJECTIVE:  Note: Objective measures were completed at Evaluation unless otherwise noted.  DIAGNOSTIC FINDINGS:  Right Hip Radiograph on 10/06/2023: FINDINGS: There is no evidence of hip fracture or dislocation. No significant arthropathy. No bony lesions identified. The bony pelvis is unremarkable.  Lumbar MRI on  05/11/2022: IMPRESSION: 1. At L5-S1, severe proximal left foraminal stenosis with potential impingement. Moderate left subarticular recess stenosis. 2. At L4-L5, moderate canal and bilateral subarticular recess stenosis with mild right foraminal stenosis. 3. At L3-L4, mild canal stenosis with mild right foraminal stenosis.  PATIENT SURVEYS:  Eval:  FOTO 40 (projected 58 by visit 15)  COGNITION: Overall cognitive status: Within functional limits for tasks assessed     SENSATION: Reports some numbness and tingling down his right leg, especially with prolonged standing.  MUSCLE LENGTH: Hamstrings: tightness noted bilat  POSTURE: rounded shoulders, forward head, and flexed trunk   PALPATION: Tenderness to palpation primarily right hip, but also some in his lumbar region  LOWER EXTREMITY ROM:  Eval:  WFL with some pain at end range  LOWER EXTREMITY MMT:  Eval:   Right hip strength grossly 4-/5 Right hamstring strength is 4/5 Right quad strength is 5-/5 Left LE strength is WFL   FUNCTIONAL TESTS:  Eval: 5 times sit to stand: 12.30 sec Timed up and  go (TUG): 12.57 sec 3 minute walk test: 537 ft  GAIT: Distance walked: >500 ft Assistive device utilized: None Level of assistance: Complete Independence Comments: Patient with antalgic gait with decreased stance time on right LE                                                                                                                                TODAY'S TREATMENT  DATE:  11/02/2023 NuStep Level 3 7 mins- PT present to discuss status 3 way stability ball stretch x 8 each direction Hip matrix abduction 25# 2 x 10 bilateral; attempted hip flexion but was too pain full Manual: Rt lateral hip distraction with belt 5 x 30 oscillations and long axis hip distraction 10 x 5 sec holds Patient education on pain     10/31/2023 NuStep Level 3 7 mins- PT present to discuss status Supine hip flexion stretch 2 x 20 bilateral   Supine Figure four stretch 2 x 20 sec bilateral  Supine Hip internal/external stretch x 10 each direction Supine LTR x 10 Supine hamstring stretch with green strap 2 x 30 sec Supine PPT x 10 4 sec holds Supine TA contraction + hip flexion x 10  Bridges 2 x 10 Hip abduction & Extension taps with yellow loop around legs 2 x 10 on Rt  Manual: Addaday to Rt hip to improve circulation and blood flow to gluteals. PT present to monitor status.   10/27/2023 NuStep Level 3 6 mins- PT present to discuss status Seated Scap Retraction x 10 Seated hamstring stretch 2 x 30 bilateral  Clamshell 2 x 10 Supine LTR x 10 each direction; v/c to not push past pain Supine Piriformis stretch 2 x 30sec bilateral  Supine PPT x 15 v/c for breathing technique  Supine TA contraction + hip flexion x 20 Bridging 2 x 10 Sit to Stand with 5# KB 2 x 10 Side stepping with yellow loop x 3 laps at counter    PATIENT EDUCATION:  Education details: Issued HEP Person educated: Patient Education method: Explanation, Facilities manager, and Handouts Education comprehension: verbalized understanding  HOME EXERCISE PROGRAM: Access Code: [email or web address] URL: [email or web address] Date: 10/31/2023 Prepared by: Penelope Bowie  Exercises - Seated Scapular Retraction  - 1 x daily - 7 x weekly - 2 sets - 10 reps - Seated Hamstring Stretch  - 1 x daily - 7 x weekly - 2 reps - 20 sec hold - Clamshell  - 1 x daily - 7 x weekly - 2 sets - 10 reps - Supine Lower Trunk Rotation  - 1 x daily - 7 x weekly - 1 sets - 10 reps - Supine Pelvic Tilt  - 1 x daily - 7 x weekly - 2 sets - 10 reps - Supine Piriformis Stretch with Foot on Ground  - 1 x daily - 7 x weekly - 2 reps - 20 sec hold - Supine Bridge  - 1 x daily - 7  x weekly - 2 sets - 10 reps - Supine March  - 1 x daily - 7 x weekly - 2 sets - 10 reps - Hip Flexion Stretch  - 1 x daily - 7 x weekly - 2 sets - 10 reps - Supine Hip Internal and External Rotation  - 1 x daily -  7 x weekly - 1 sets - 10 reps - 2 hold (discontinued)  ASSESSMENT:  CLINICAL IMPRESSION: April continues to experience increased Rt hip pain when he puts weight on his leg and with ER/IR. Patient was somewhat limited in what he could perform because of his pain. Incorporated lateral and long axis hip distraction manual therapy techniques and patient verbalized symptoms relief. He overall verbalized feeling better. Provided patient education on back pain vs hip pain and what his pain could be coming from. Patient verbalized understanding. Patient will benefit from skilled PT to address the below impairments and improve overall function.      OBJECTIVE IMPAIRMENTS: decreased balance, difficulty walking, decreased strength, increased muscle spasms, impaired flexibility, postural dysfunction, and pain.   ACTIVITY LIMITATIONS: lifting, bending, standing, and squatting  PARTICIPATION LIMITATIONS: driving, community activity, and yard work  PERSONAL FACTORS: Past/current experiences, Time since onset of injury/illness/exacerbation, and 3+ comorbidities: RA, IT band syndrome, Hx of cervical and lumbar surgeries  are also affecting patient's functional outcome.   REHAB POTENTIAL: Good  CLINICAL DECISION MAKING: Evolving/moderate complexity  EVALUATION COMPLEXITY: Moderate   GOALS: Goals reviewed with patient? Yes  SHORT TERM GOALS: Target date: 11/18/2023 Patient will be independent with initial HEP. Baseline: Goal status: INITIAL  2.  Patient will report at least a 25% improvement in symptoms since initial evaluation. Baseline:  Goal status: INITIAL   LONG TERM GOALS: Target date: 12/16/2023  Patient will be independent with advanced HEP to allow for self progression after discharge. Baseline:  Goal status: INITIAL  2.  Patient will increase FOTO to 58 to demonstrate improvements in functional mobility. Baseline: 40 Goal status: INITIAL  3.  Patient will increase right hip  strength to [facility] to allow him to perform various tasks to return to being able to perform yard work. Baseline:  Goal status: INITIAL  4.  Patient to report ability to ambulate for at least 15 minutes without increased pain to allow community ambulation. Baseline:  Goal status: INITIAL    PLAN:  PT FREQUENCY: 2x/week  PT DURATION: 8 weeks  PLANNED INTERVENTIONS: 97164- PT Re-evaluation, 97110-Therapeutic exercises, 97530- Therapeutic activity, 97112- Neuromuscular re-education, 97535- Self Care, 40981- Manual therapy, [phone]- Gait training, [phone]- Canalith repositioning, [id number]- Aquatic Therapy, 97014- Electrical stimulation (unattended), [id number]- Electrical stimulation (manual), 97016- Vasopneumatic device, [id number]- Ultrasound, [id number]- Traction (mechanical), Patient/Family education, Balance training, Stair training, Taping, Dry Needling, Joint mobilization, Joint manipulation, Spinal manipulation, Spinal mobilization, Cryotherapy, Moist heat, and Biofeedback  PLAN FOR NEXT SESSION: Assess tolerance to treatment session, DN??; continue manual techniques as tolerated; strengthening   Penelope Bowie, PT 11/02/23 5:10 PM [facility] [address] [place], [place] [address] Phone # [phone] Fax [phone]

## 2023-11-08 ENCOUNTER — Other Ambulatory Visit: Payer: Self-pay | Admitting: Family Medicine

## 2023-11-08 ENCOUNTER — Encounter: Payer: Self-pay | Admitting: Rehabilitative and Restorative Service Providers"

## 2023-11-08 ENCOUNTER — Ambulatory Visit: Payer: Medicare Other | Admitting: Rehabilitative and Restorative Service Providers"

## 2023-11-08 DIAGNOSIS — M25551 Pain in right hip: Secondary | ICD-10-CM

## 2023-11-08 DIAGNOSIS — M6281 Muscle weakness (generalized): Secondary | ICD-10-CM

## 2023-11-08 DIAGNOSIS — M5431 Sciatica, right side: Secondary | ICD-10-CM

## 2023-11-08 DIAGNOSIS — G5702 Lesion of sciatic nerve, left lower limb: Secondary | ICD-10-CM

## 2023-11-08 DIAGNOSIS — M5417 Radiculopathy, lumbosacral region: Secondary | ICD-10-CM

## 2023-11-08 DIAGNOSIS — R252 Cramp and spasm: Secondary | ICD-10-CM

## 2023-11-08 MED ORDER — PREGABALIN 25 MG PO CAPS: 50.0000 mg | ORAL_CAPSULE | Freq: Two times a day (BID) | ORAL | 1 refills | Status: DC

## 2023-11-08 NOTE — Progress Notes (Signed)
Patient requested refill on MyChart.  This is a reasonable request given therapeutic benefits.  Prescription refill was sent in.

## 2023-11-08 NOTE — Therapy (Signed)
OUTPATIENT PHYSICAL THERAPY LOWER EXTREMITY TREATMENT   Patient Name: Isaiah Price MRN: [id number] DOB:12-12-1950, 73 y.o., male Today's Date: 11/08/2023  END OF SESSION:  PT End of Session - 11/08/23 1018     Visit Number 5    Date for PT Re-Evaluation 12/16/23    Authorization Type Medicare/ BCBC    Progress Note Due on Visit 10    PT Start Time 1015    PT Stop Time 1055    PT Time Calculation (min) 40 min    Activity Tolerance Patient tolerated treatment well    Behavior During Therapy WFL for tasks assessed/performed                Past Medical History:  Diagnosis Date   Achilles tendinitis, left leg    At risk for sleep apnea    STOP--BANG SCORE= 5  (routed to pt's pcp in epic 11-13-2018)   Cervicogenic headache 06/08/2021   Chronic insomnia 12/31/2015   Chronic neck pain    Congenital pectus excavatum    per pt had pulmonary test ,  60% restrictive by states asymptomatic   DDD (degenerative disc disease), cervical    Epicondylitis, lateral, right    Gait abnormality 06/08/2021   GERD (gastroesophageal reflux disease)    History of exercise intolerance    09-02-2017  ETT,  negative for ischemia   History of pericarditis 1985   HTN (hypertension)    Hypercholesteremia 12/31/2015   Iritis    IT band syndrome 05/06/2014   Lower urinary tract symptoms (LUTS)    Neuropathy    bilateral arms with tingling and numbness at times due to cervical neck   OA (osteoarthritis)    Polyarthralgia    Prostate cancer [facility])    urologist-  dr Laverle Patter--- Stage T1c, Gleason 3+4, 06/08/21 recurrent prostate cancer   Rheumatoid arthritis [facility])    rheumatologist-- dr Dierdre Forth--- seropositive,multiple sites   Tendon disorder 2022   torn tendon, right arm   Wears glasses    Past Surgical History:  Procedure Laterality Date   ANTERIOR CERVICAL DECOMP/DISCECTOMY FUSION  1991   dr Newell Coral   C5 -- 7  (per pt fusion failed)   CARPAL TUNNEL RELEASE Right done at same time of [facility]  fusion   CARPOMETACARPAL ([facility]) FUSION OF THUMB Bilateral 2015 and 2014  approx.   CATARACT EXTRACTION W/ INTRAOCULAR LENS  IMPLANT, BILATERAL  2013   LYMPHADENECTOMY Bilateral 11/16/2018   Procedure: LYMPHADENECTOMY, PELVIC;  Surgeon: Heloise Purpura, MD;  Location: [facility];  Service: Urology;  Laterality: Bilateral;   ROBOT ASSISTED LAPAROSCOPIC RADICAL PROSTATECTOMY N/A 11/16/2018   Procedure: XI ROBOTIC ASSISTED LAPAROSCOPIC RADICAL PROSTATECTOMY LEVEL 2;  Surgeon: Heloise Purpura, MD;  Location: [facility];  Service: Urology;  Laterality: N/A;   SHOULDER SURGERY Left age 63   TONSILLECTOMY  child   Patient Active Problem List   Diagnosis Date Noted   Dyslipidemia 10/02/2023   Aortic root enlargement (HCC) 10/02/2023   Elevated coronary artery calcium score 10/02/2023   Sciatic pain, right 09/07/2023   Palpitations 09/27/2021   Cervicogenic headache 06/08/2021   Gait abnormality 06/08/2021   Prostate cancer (HCC) 11/16/2018   Malignant neoplasm of prostate (HCC) 09/18/2018   Daytime somnolence 08/04/2017   Dyspnea on exertion 08/04/2017   Exercise intolerance 08/04/2017   Need for influenza vaccination 07/01/2017   Ulnar neuropathy at elbow, 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   Essential hypertension 01/12/2017   Groin strain 11/18/2016   Irregular bowel habits 11/18/2016   Rheumatoid arthritis (  HCC) 01/22/2016   Chronic insomnia 12/31/2015   Chronic pain 12/31/2015   Esophageal reflux 12/31/2015   Hypercholesteremia 12/31/2015   Chronic arthritis 12/31/2015   Wrist pain, right 12/01/2015   IT band syndrome 05/06/2014   Cervical radiculopathy 07/14/2011   Arthritis pain, hand 06/02/2011   Osteoarthritis of CMC joint of thumb 06/02/2011    PCP: Barbie Banner, MD  REFERRING PROVIDER: Ivor Messier, MD  REFERRING DIAG: [phone] (ICD-10-CM) - Right hip pain  THERAPY DIAG:  Muscle weakness (generalized)  Pain in right hip  Radiculopathy, lumbosacral region  Cramp and  spasm  Piriformis syndrome, left  Rationale for Evaluation and Treatment: Rehabilitation  ONSET DATE: October 2024  SUBJECTIVE:   SUBJECTIVE STATEMENT: Patient states that he is still having pain.  Reports that he is agreeable to trying dry needling today.  PERTINENT HISTORY: RA, IT band syndrome, ACDF of C5-7 in 1991, Lumbar Laminectomy L5-S1 October 2023  PAIN: 11/02/2023 Are you having pain? Yes: NPRS scale: 0-2/10 Pain location: right buttock and hip Pain description: shooting, sharp, sometimes aching and radiating down RLE Aggravating factors: standing and walking Relieving factors: sitting  PRECAUTIONS: None  RED FLAGS: None   WEIGHT BEARING RESTRICTIONS: No  FALLS:  Has patient fallen in last 6 months? No  LIVING ENVIRONMENT: Lives with: lives with their spouse Lives in: House/apartment Stairs:  one story home Has following equipment at home: Grab bars  OCCUPATION: retired, formerly a Designer, television/film set  PLOF: Independent and Leisure: yard work  PATIENT GOALS: To be able to walk without hurting.  NEXT MD VISIT: Dr Rene Kocher on 10/26/2023, Rheumatologist on 11/21/2023  OBJECTIVE:  Note: Objective measures were completed at Evaluation unless otherwise noted.  DIAGNOSTIC FINDINGS:  Right Hip Radiograph on 10/06/2023: FINDINGS: There is no evidence of hip fracture or dislocation. No significant arthropathy. No bony lesions identified. The bony pelvis is unremarkable.  Lumbar MRI on 05/11/2022: IMPRESSION: 1. At L5-S1, severe proximal left foraminal stenosis with potential impingement. Moderate left subarticular recess stenosis. 2. At L4-L5, moderate canal and bilateral subarticular recess stenosis with mild right foraminal stenosis. 3. At L3-L4, mild canal stenosis with mild right foraminal stenosis.  PATIENT SURVEYS:  Eval:  FOTO 40 (projected 58 by visit 15)  COGNITION: Overall cognitive status: Within functional limits for tasks  assessed     SENSATION: Reports some numbness and tingling down his right leg, especially with prolonged standing.  MUSCLE LENGTH: Hamstrings: tightness noted bilat  POSTURE: rounded shoulders, forward head, and flexed trunk   PALPATION: Tenderness to palpation primarily right hip, but also some in his lumbar region  LOWER EXTREMITY ROM:  Eval:  WFL with some pain at end range  LOWER EXTREMITY MMT:  Eval:   Right hip strength grossly 4-/5 Right hamstring strength is 4/5 Right quad strength is 5-/5 Left LE strength is WFL   FUNCTIONAL TESTS:  Eval: 5 times sit to stand: 12.30 sec Timed up and go (TUG): 12.57 sec 3 minute walk test: 537 ft  GAIT: Distance walked: >500 ft Assistive device utilized: None Level of assistance: Complete Independence Comments: Patient with antalgic gait with decreased stance time on right LE  TODAY'S TREATMENT   DATE: 11/08/2023 Nustep level 5 (LE only) x6 min with PT present to discuss status Supine hamstring, hip adductor, and IT band stretch with strap 2x20 sec each bilat Prone on elbows x2 min (pt did not report any change in back pain, just a stretch to his hip flexors) Prone hip extension 2x10 bilat Standing hip flexor stretch with foot on PT mat x20 sec bilat Trigger Point Dry Needling Initial Treatment: Pt instructed on Dry Needling rational, procedures, and possible side effects. Pt instructed to expect mild to moderate muscle soreness later in the day and/or into the next day.  Pt instructed in methods to reduce muscle soreness. Pt instructed to continue prescribed HEP. Patient was educated on signs and symptoms of infection and other risk factors and advised to seek medical attention should they occur.  Patient verbalized understanding of these instructions and education.  Patient Verbal Consent Given:  Yes Education Handout Provided: Yes Muscles Treated: bilateral lumbar multifidi, bilateral glutes/piriformis Electrical Stimulation Performed: No Treatment Response/Outcome: Utilized skilled palpation to identify bony landmarks and trigger points.  Able to illicit twitch response and muscle elongation.  Soft tissue mobilization with cocoa butter following to further promote tissue elongation.     DATE: 11/02/2023 NuStep Level 3 7 mins- PT present to discuss status 3 way stability ball stretch x 8 each direction Hip matrix abduction 25# 2 x 10 bilateral; attempted hip flexion but was too pain full Manual: Rt lateral hip distraction with belt 5 x 30 oscillations and long axis hip distraction 10 x 5 sec holds Patient education on pain     10/31/2023 NuStep Level 3 7 mins- PT present to discuss status Supine hip flexion stretch 2 x 20 bilateral  Supine Figure four stretch 2 x 20 sec bilateral  Supine Hip internal/external stretch x 10 each direction Supine LTR x 10 Supine hamstring stretch with green strap 2 x 30 sec Supine PPT x 10 4 sec holds Supine TA contraction + hip flexion x 10  Bridges 2 x 10 Hip abduction & Extension taps with yellow loop around legs 2 x 10 on Rt  Manual: Addaday to Rt hip to improve circulation and blood flow to gluteals. PT present to monitor status.   PATIENT EDUCATION:  Education details: Issued HEP Person educated: Patient Education method: Explanation, Facilities manager, and Handouts Education comprehension: verbalized understanding  HOME EXERCISE PROGRAM: Access Code: [email or web address] URL: [email or web address] Date: 11/08/2023 Prepared by: Reather Laurence  Exercises - Seated Scapular Retraction  - 1 x daily - 7 x weekly - 2 sets - 10 reps - Seated Hamstring Stretch  - 1 x daily - 7 x weekly - 2 reps - 20 sec hold - Clamshell  - 1 x daily - 7 x weekly - 2 sets - 10 reps - Supine Lower Trunk Rotation  - 1 x daily - 7 x weekly - 1 sets - 10  reps - Supine Pelvic Tilt  - 1 x daily - 7 x weekly - 2 sets - 10 reps - Supine Piriformis Stretch with Foot on Ground  - 1 x daily - 7 x weekly - 2 reps - 20 sec hold - Supine Bridge  - 1 x daily - 7 x weekly - 2 sets - 10 reps - Supine March  - 1 x daily - 7 x weekly - 2 sets - 10 reps - Hip Flexion Stretch  - 1 x daily - 7 x weekly - 2 sets - 10 reps - Supine  Hip Internal and External Rotation  - 1 x daily - 7 x weekly - 1 sets - 10 reps - 2 hold - Prone Hip Extension  - 1 x daily - 7 x weekly - 2 sets - 10 reps  ASSESSMENT:  CLINICAL IMPRESSION: Mr Vermette presents to skilled PT reporting that he has made at least 25% improvement since starting PT.  Patient has met all short term goals.  Patient reports that he is going to start pelvic PT soon and wanted to be sure that this would not be a conflict; this PT assured patient that it would be okay, but to make the other PT aware.  Patient was agreeable to trying dry needling today, secondary to having limited progress with previous exercises.  Patient continues to deny pain at rest, but states that he has pain when he weight bears through his right leg.  Patient with increased tightness noted and had some difficulty with the stretching with the stretch strap.  Patient with good twitch response, especially with the right lumbar multifidi.  Patient continues to require skilled PT to progress towards goal related activities.    OBJECTIVE IMPAIRMENTS: decreased balance, difficulty walking, decreased strength, increased muscle spasms, impaired flexibility, postural dysfunction, and pain.   ACTIVITY LIMITATIONS: lifting, bending, standing, and squatting  PARTICIPATION LIMITATIONS: driving, community activity, and yard work  PERSONAL FACTORS: Past/current experiences, Time since onset of injury/illness/exacerbation, and 3+ comorbidities: RA, IT band syndrome, Hx of cervical and lumbar surgeries  are also affecting patient's functional outcome.    REHAB POTENTIAL: Good  CLINICAL DECISION MAKING: Evolving/moderate complexity  EVALUATION COMPLEXITY: Moderate   GOALS: Goals reviewed with patient? Yes  SHORT TERM GOALS: Target date: 11/18/2023 Patient will be independent with initial HEP. Baseline: Goal status: Met on 11/08/23  2.  Patient will report at least a 25% improvement in symptoms since initial evaluation. Baseline:  Goal status: Met on 11/08/23   LONG TERM GOALS: Target date: 12/16/2023  Patient will be independent with advanced HEP to allow for self progression after discharge. Baseline:  Goal status: INITIAL  2.  Patient will increase FOTO to 58 to demonstrate improvements in functional mobility. Baseline: 40 Goal status: INITIAL  3.  Patient will increase right hip strength to [facility] to allow him to perform various tasks to return to being able to perform yard work. Baseline:  Goal status: INITIAL  4.  Patient to report ability to ambulate for at least 15 minutes without increased pain to allow community ambulation. Baseline:  Goal status: INITIAL    PLAN:  PT FREQUENCY: 2x/week  PT DURATION: 8 weeks  PLANNED INTERVENTIONS: 97164- PT Re-evaluation, 97110-Therapeutic exercises, 97530- Therapeutic activity, 97112- Neuromuscular re-education, 97535- Self Care, 95621- Manual therapy, [id number]- Gait training, [id number]- Canalith repositioning, [id number]- Aquatic Therapy, 97014- Electrical stimulation (unattended), [id number]- Electrical stimulation (manual), 97016- Vasopneumatic device, [id number]- Ultrasound, [id number]- Traction (mechanical), Patient/Family education, Balance training, Stair training, Taping, Dry Needling, Joint mobilization, Joint manipulation, Spinal manipulation, Spinal mobilization, Cryotherapy, Moist heat, and Biofeedback  PLAN FOR NEXT SESSION: Assess and progress HEP as indicated, strengthening, flexibility, manual/dry needling as indicated    Reather Laurence, PT, DPT 11/08/23, 11:24 AM  [facility] [address] [place], [place] [address] Phone # [phone] Fax [phone]

## 2023-11-08 NOTE — Patient Instructions (Addendum)
 Trigger Point Dry Needling  What is Trigger Point Dry Needling (DN)? DN is a physical therapy technique used to treat muscle pain and dysfunction. Specifically, DN helps deactivate muscle trigger points (muscle knots).  A thin filiform needle is used to penetrate the skin and stimulate the underlying trigger point. The goal is for a local twitch response (LTR) to occur and for the trigger point to relax. No medication of any kind is injected during the procedure.   What Does Trigger Point Dry Needling Feel Like?  The procedure feels different for each individual patient. Some patients report that they do not actually feel the needle enter the skin and overall the process is not painful. Very mild bleeding may occur. However, many patients feel a deep cramping in the muscle in which the needle was inserted. This is the local twitch response.   How Will I feel after the treatment? Soreness is normal, and the onset of soreness may not occur for a few hours. Typically this soreness does not last longer than two days.  Bruising is uncommon, however; ice can be used to decrease any possible bruising.  In rare cases feeling tired or nauseous after the treatment is normal. In addition, your symptoms may get worse before they get better, this period will typically not last longer than 24 hours.   What Can I do After My Treatment? Increase your hydration by drinking more water for the next 24 hours.  You may place ice or heat on the areas treated that have become sore, however, do not use heat on inflamed or bruised areas. Heat often brings more relief post needling. You can continue your regular activities, but vigorous activity is not recommended initially after the treatment for 24 hours. DN is best combined with other physical therapy such as strengthening, stretching, and other therapies.   What are the complications? While your therapist has had extensive training in minimizing the risks of trigger  point dry needling, it is important to understand the risks of any procedure.  Risks include bleeding, pain, fatigue, hematoma, infection, vertigo, nausea or nerve involvement. Monitor for any changes to your skin or sensation. Contact your therapist or MD with concerns.  A rare but serious complication is a pneumothorax over or near your middle and upper chest and back If you have dry needling in this area, monitor for the following symptoms: Shortness of breath on exertion and/or Difficulty taking a deep breath and/or Chest Pain and/or A dry cough If any of the above symptoms develop, please go to the nearest emergency room or call 911. Tell them you had dry needling over your thorax and report any symptoms you are having. Please follow-up with your treating therapist after you complete the medical evaluation.   [facility]  [address] [place] [address].  [phone]

## 2023-11-10 ENCOUNTER — Encounter: Payer: Self-pay | Admitting: Rehabilitative and Restorative Service Providers"

## 2023-11-10 ENCOUNTER — Ambulatory Visit: Payer: Medicare Other | Admitting: Rehabilitative and Restorative Service Providers"

## 2023-11-10 DIAGNOSIS — G5702 Lesion of sciatic nerve, left lower limb: Secondary | ICD-10-CM

## 2023-11-10 DIAGNOSIS — M5417 Radiculopathy, lumbosacral region: Secondary | ICD-10-CM

## 2023-11-10 DIAGNOSIS — M25551 Pain in right hip: Secondary | ICD-10-CM

## 2023-11-10 DIAGNOSIS — R252 Cramp and spasm: Secondary | ICD-10-CM

## 2023-11-10 DIAGNOSIS — M6281 Muscle weakness (generalized): Secondary | ICD-10-CM

## 2023-11-10 NOTE — Therapy (Signed)
OUTPATIENT PHYSICAL THERAPY TREATMENT NOTE   Patient Name: Isaiah Price MRN: [id number] DOB:01-14-1951, 73 y.o., male Today's Date: 11/10/2023  END OF SESSION:  PT End of Session - 11/10/23 1232     Visit Number 6    Date for PT Re-Evaluation 12/16/23    Authorization Type Medicare/ BCBC    Progress Note Due on Visit 10    PT Start Time 1228    PT Stop Time 1310    PT Time Calculation (min) 42 min    Activity Tolerance Patient tolerated treatment well    Behavior During Therapy WFL for tasks assessed/performed                Past Medical History:  Diagnosis Date   Achilles tendinitis, left leg    At risk for sleep apnea    STOP--BANG SCORE= 5  (routed to pt's pcp in epic 11-13-2018)   Cervicogenic headache 06/08/2021   Chronic insomnia 12/31/2015   Chronic neck pain    Congenital pectus excavatum    per pt had pulmonary test ,  60% restrictive by states asymptomatic   DDD (degenerative disc disease), cervical    Epicondylitis, lateral, right    Gait abnormality 06/08/2021   GERD (gastroesophageal reflux disease)    History of exercise intolerance    09-02-2017  ETT,  negative for ischemia   History of pericarditis 1985   HTN (hypertension)    Hypercholesteremia 12/31/2015   Iritis    IT band syndrome 05/06/2014   Lower urinary tract symptoms (LUTS)    Neuropathy    bilateral arms with tingling and numbness at times due to cervical neck   OA (osteoarthritis)    Polyarthralgia    Prostate cancer [facility])    urologist-  dr Laverle Patter--- Stage T1c, Gleason 3+4, 06/08/21 recurrent prostate cancer   Rheumatoid arthritis [facility])    rheumatologist-- dr Dierdre Forth--- seropositive,multiple sites   Tendon disorder 2022   torn tendon, right arm   Wears glasses    Past Surgical History:  Procedure Laterality Date   ANTERIOR CERVICAL DECOMP/DISCECTOMY FUSION  1991   dr Newell Coral   C5 -- 7  (per pt fusion failed)   CARPAL TUNNEL RELEASE Right done at same time of CMC fusion    CARPOMETACARPAL (CMC) FUSION OF THUMB Bilateral 2015 and 2014  approx.   CATARACT EXTRACTION W/ INTRAOCULAR LENS  IMPLANT, BILATERAL  2013   LYMPHADENECTOMY Bilateral 11/16/2018   Procedure: LYMPHADENECTOMY, PELVIC;  Surgeon: Heloise Purpura, MD;  Location: [facility];  Service: Urology;  Laterality: Bilateral;   ROBOT ASSISTED LAPAROSCOPIC RADICAL PROSTATECTOMY N/A 11/16/2018   Procedure: XI ROBOTIC ASSISTED LAPAROSCOPIC RADICAL PROSTATECTOMY LEVEL 2;  Surgeon: Heloise Purpura, MD;  Location: [facility];  Service: Urology;  Laterality: N/A;   SHOULDER SURGERY Left age 68   TONSILLECTOMY  child   Patient Active Problem List   Diagnosis Date Noted   Dyslipidemia 10/02/2023   Aortic root enlargement (HCC) 10/02/2023   Elevated coronary artery calcium score 10/02/2023   Sciatic pain, right 09/07/2023   Palpitations 09/27/2021   Cervicogenic headache 06/08/2021   Gait abnormality 06/08/2021   Prostate cancer (HCC) 11/16/2018   Malignant neoplasm of prostate (HCC) 09/18/2018   Daytime somnolence 08/04/2017   Dyspnea on exertion 08/04/2017   Exercise intolerance 08/04/2017   Need for influenza vaccination 07/01/2017   Ulnar neuropathy at elbow, 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   Essential hypertension 01/12/2017   Groin strain 11/18/2016   Irregular bowel habits 11/18/2016   Rheumatoid arthritis (HCC)  01/22/2016   Chronic insomnia 12/31/2015   Chronic pain 12/31/2015   Esophageal reflux 12/31/2015   Hypercholesteremia 12/31/2015   Chronic arthritis 12/31/2015   Wrist pain, right 12/01/2015   IT band syndrome 05/06/2014   Cervical radiculopathy 07/14/2011   Arthritis pain, hand 06/02/2011   Osteoarthritis of CMC joint of thumb 06/02/2011    PCP: Barbie Banner, MD  REFERRING PROVIDER: Ivor Messier, MD  REFERRING DIAG: [phone] (ICD-10-CM) - Right hip pain  THERAPY DIAG:  Muscle weakness (generalized)  Pain in right hip  Radiculopathy, lumbosacral region  Cramp and spasm  Piriformis  syndrome, left  Rationale for Evaluation and Treatment: Rehabilitation  ONSET DATE: October 2024  SUBJECTIVE:   SUBJECTIVE STATEMENT: Patient states that he had some soreness and pain yesterday, but he thinks the needling did help some.  PERTINENT HISTORY: RA, IT band syndrome, ACDF of C5-7 in 1991, Lumbar Laminectomy L5-S1 October 2023  PAIN: 11/02/2023 Are you having pain? Yes: NPRS scale: 5/10 Pain location: right buttock and hip Pain description: shooting, sharp, sometimes aching and radiating down RLE Aggravating factors: standing and walking Relieving factors: sitting  PRECAUTIONS: None  RED FLAGS: None   WEIGHT BEARING RESTRICTIONS: No  FALLS:  Has patient fallen in last 6 months? No  LIVING ENVIRONMENT: Lives with: lives with their spouse Lives in: House/apartment Stairs:  one story home Has following equipment at home: Grab bars  OCCUPATION: retired, formerly a Designer, television/film set  PLOF: Independent and Leisure: yard work  PATIENT GOALS: To be able to walk without hurting.  NEXT MD VISIT: Dr Rene Kocher on 10/26/2023, Rheumatologist on 11/21/2023  OBJECTIVE:  Note: Objective measures were completed at Evaluation unless otherwise noted.  DIAGNOSTIC FINDINGS:  Right Hip Radiograph on 10/06/2023: FINDINGS: There is no evidence of hip fracture or dislocation. No significant arthropathy. No bony lesions identified. The bony pelvis is unremarkable.  Lumbar MRI on 05/11/2022: IMPRESSION: 1. At L5-S1, severe proximal left foraminal stenosis with potential impingement. Moderate left subarticular recess stenosis. 2. At L4-L5, moderate canal and bilateral subarticular recess stenosis with mild right foraminal stenosis. 3. At L3-L4, mild canal stenosis with mild right foraminal stenosis.  PATIENT SURVEYS:  Eval:  FOTO 40 (projected 58 by visit 15)  COGNITION: Overall cognitive status: Within functional limits for tasks assessed     SENSATION: Reports some numbness  and tingling down his right leg, especially with prolonged standing.  MUSCLE LENGTH: Hamstrings: tightness noted bilat  POSTURE: rounded shoulders, forward head, and flexed trunk   PALPATION: Tenderness to palpation primarily right hip, but also some in his lumbar region  LOWER EXTREMITY ROM:  Eval:  WFL with some pain at end range  LOWER EXTREMITY MMT:  Eval:   Right hip strength grossly 4-/5 Right hamstring strength is 4/5 Right quad strength is 5-/5 Left LE strength is WFL   FUNCTIONAL TESTS:  Eval: 5 times sit to stand: 12.30 sec Timed up and go (TUG): 12.57 sec 3 minute walk test: 537 ft  GAIT: Distance walked: >500 ft Assistive device utilized: None Level of assistance: Complete Independence Comments: Patient with antalgic gait with decreased stance time on right LE  TODAY'S TREATMENT   DATE: 11/10/2023 Nustep level 5 (LE only) x7 min with PT present to discuss status Supine hamstring, hip adductor, and IT band stretch with strap 2x20 sec each bilat Hooklying transversus abdominus with pressing beach ball into thighs 2x10 Supine figure 4 piriformis stretch x20 sec bilat (with towel to assist) Hooklying clamshell with yellow loop 2x10 Manual Therapy in prone:  Addaday to bilateral glutes/piriformis, IT band, and hamstrings to promote tissue elongation and decreased pain   DATE: 11/08/2023 Nustep level 5 (LE only) x6 min with PT present to discuss status Supine hamstring, hip adductor, and IT band stretch with strap 2x20 sec each bilat Prone on elbows x2 min (pt did not report any change in back pain, just a stretch to his hip flexors) Prone hip extension 2x10 bilat Standing hip flexor stretch with foot on PT mat x20 sec bilat Trigger Point Dry Needling Initial Treatment: Pt instructed on Dry Needling rational, procedures, and possible  side effects. Pt instructed to expect mild to moderate muscle soreness later in the day and/or into the next day.  Pt instructed in methods to reduce muscle soreness. Pt instructed to continue prescribed HEP. Patient was educated on signs and symptoms of infection and other risk factors and advised to seek medical attention should they occur.  Patient verbalized understanding of these instructions and education.  Patient Verbal Consent Given: Yes Education Handout Provided: Yes Muscles Treated: bilateral lumbar multifidi, bilateral glutes/piriformis Electrical Stimulation Performed: No Treatment Response/Outcome: Utilized skilled palpation to identify bony landmarks and trigger points.  Able to illicit twitch response and muscle elongation.  Soft tissue mobilization with cocoa butter following to further promote tissue elongation.     DATE: 11/02/2023 NuStep Level 3 7 mins- PT present to discuss status 3 way stability ball stretch x 8 each direction Hip matrix abduction 25# 2 x 10 bilateral; attempted hip flexion but was too pain full Manual: Rt lateral hip distraction with belt 5 x 30 oscillations and long axis hip distraction 10 x 5 sec holds Patient education on pain     PATIENT EDUCATION:  Education details: Issued HEP Person educated: Patient Education method: Explanation, Facilities manager, and Handouts Education comprehension: verbalized understanding  HOME EXERCISE PROGRAM: Access Code: [email or web address] URL: [email or web address] Date: 11/08/2023 Prepared by: Reather Laurence  Exercises - Seated Scapular Retraction  - 1 x daily - 7 x weekly - 2 sets - 10 reps - Seated Hamstring Stretch  - 1 x daily - 7 x weekly - 2 reps - 20 sec hold - Clamshell  - 1 x daily - 7 x weekly - 2 sets - 10 reps - Supine Lower Trunk Rotation  - 1 x daily - 7 x weekly - 1 sets - 10 reps - Supine Pelvic Tilt  - 1 x daily - 7 x weekly - 2 sets - 10 reps - Supine Piriformis Stretch with Foot on  Ground  - 1 x daily - 7 x weekly - 2 reps - 20 sec hold - Supine Bridge  - 1 x daily - 7 x weekly - 2 sets - 10 reps - Supine March  - 1 x daily - 7 x weekly - 2 sets - 10 reps - Hip Flexion Stretch  - 1 x daily - 7 x weekly - 2 sets - 10 reps - Supine Hip Internal and External Rotation  - 1 x daily - 7 x weekly - 1 sets - 10 reps - 2 hold - Prone Hip Extension  -  1 x daily - 7 x weekly - 2 sets - 10 reps  ASSESSMENT:  CLINICAL IMPRESSION: Mr Shindle presents to skilled PT reporting that he had some increased soreness yesterday.  Patient continues to have the most pain with standing and weight bearing through his right hip.  Patient did report decreased pain with Manual Therapy with Addaday and decreased pain with lying flat, but continues to have pain with weight bearing through right LE.  Patient continues to require skilled PT to progress towards goal related activities.    OBJECTIVE IMPAIRMENTS: decreased balance, difficulty walking, decreased strength, increased muscle spasms, impaired flexibility, postural dysfunction, and pain.   ACTIVITY LIMITATIONS: lifting, bending, standing, and squatting  PARTICIPATION LIMITATIONS: driving, community activity, and yard work  PERSONAL FACTORS: Past/current experiences, Time since onset of injury/illness/exacerbation, and 3+ comorbidities: RA, IT band syndrome, Hx of cervical and lumbar surgeries  are also affecting patient's functional outcome.   REHAB POTENTIAL: Good  CLINICAL DECISION MAKING: Evolving/moderate complexity  EVALUATION COMPLEXITY: Moderate   GOALS: Goals reviewed with patient? Yes  SHORT TERM GOALS: Target date: 11/18/2023 Patient will be independent with initial HEP. Baseline: Goal status: Met on 11/08/23  2.  Patient will report at least a 25% improvement in symptoms since initial evaluation. Baseline:  Goal status: Met on 11/08/23   LONG TERM GOALS: Target date: 12/16/2023  Patient will be independent with advanced  HEP to allow for self progression after discharge. Baseline:  Goal status: Ongoing  2.  Patient will increase FOTO to 58 to demonstrate improvements in functional mobility. Baseline: 40 Goal status: INITIAL  3.  Patient will increase right hip strength to [facility] to allow him to perform various tasks to return to being able to perform yard work. Baseline:  Goal status: INITIAL  4.  Patient to report ability to ambulate for at least 15 minutes without increased pain to allow community ambulation. Baseline:  Goal status: INITIAL    PLAN:  PT FREQUENCY: 2x/week  PT DURATION: 8 weeks  PLANNED INTERVENTIONS: 97164- PT Re-evaluation, 97110-Therapeutic exercises, 97530- Therapeutic activity, [id number]- Neuromuscular re-education, 97535- Self Care, [id number]- Manual therapy, [phone]- Gait training, [phone]- Canalith repositioning, [id number]- Aquatic Therapy, 97014- Electrical stimulation (unattended), [id number]- Electrical stimulation (manual), 97016- Vasopneumatic device, [id number]- Ultrasound, [id number]- Traction (mechanical), Patient/Family education, Balance training, Stair training, Taping, Dry Needling, Joint mobilization, Joint manipulation, Spinal manipulation, Spinal mobilization, Cryotherapy, Moist heat, and Biofeedback  PLAN FOR NEXT SESSION: Glute strengthening, manual therapy/distraction of right hip, update HEP as indicated   Reather Laurence, PT, DPT 11/10/23, 1:22 PM  [facility] [address] [place], [place] [address] Phone # [phone] Fax [phone]

## 2023-11-14 ENCOUNTER — Ambulatory Visit: Payer: Medicare Other | Admitting: Physical Therapy

## 2023-11-14 ENCOUNTER — Encounter: Payer: Self-pay | Admitting: Physical Therapy

## 2023-11-14 DIAGNOSIS — M25551 Pain in right hip: Secondary | ICD-10-CM

## 2023-11-14 DIAGNOSIS — G5702 Lesion of sciatic nerve, left lower limb: Secondary | ICD-10-CM

## 2023-11-14 DIAGNOSIS — M5417 Radiculopathy, lumbosacral region: Secondary | ICD-10-CM

## 2023-11-14 DIAGNOSIS — M6281 Muscle weakness (generalized): Secondary | ICD-10-CM

## 2023-11-14 DIAGNOSIS — R252 Cramp and spasm: Secondary | ICD-10-CM

## 2023-11-14 NOTE — Therapy (Signed)
OUTPATIENT PHYSICAL THERAPY TREATMENT NOTE   Patient Name: Isaiah Price MRN: [id number] DOB:[birth year], 73 y.o., male Today's Date: 11/14/2023  END OF SESSION:  PT End of Session - 11/14/23 1632     Visit Number 7    Date for PT Re-Evaluation 12/16/23    Authorization Type Medicare/ BCBC    Progress Note Due on Visit 10    PT Start Time 1447    PT Stop Time 1533    PT Time Calculation (min) 46 min    Activity Tolerance Patient tolerated treatment well    Behavior During Therapy WFL for tasks assessed/performed                 Past Medical History:  Diagnosis Date   Achilles tendinitis, left leg    At risk for sleep apnea    STOP--BANG SCORE= 5  (routed to pt's pcp in epic 11-13-2018)   Cervicogenic headache 06/08/2021   Chronic insomnia 12/31/2015   Chronic neck pain    Congenital pectus excavatum    per pt had pulmonary test ,  60% restrictive by states asymptomatic   DDD (degenerative disc disease), cervical    Epicondylitis, lateral, right    Gait abnormality 06/08/2021   GERD (gastroesophageal reflux disease)    History of exercise intolerance    09-02-2017  ETT,  negative for ischemia   History of pericarditis 1985   HTN (hypertension)    Hypercholesteremia 12/31/2015   Iritis    IT band syndrome 05/06/2014   Lower urinary tract symptoms (LUTS)    Neuropathy    bilateral arms with tingling and numbness at times due to cervical neck   OA (osteoarthritis)    Polyarthralgia    Prostate cancer [facility])    urologist-  dr Laverle Patter--- Stage T1c, Gleason 3+4, 06/08/21 recurrent prostate cancer   Rheumatoid arthritis [facility])    rheumatologist-- dr Dierdre Forth--- seropositive,multiple sites   Tendon disorder 2022   torn tendon, right arm   Wears glasses    Past Surgical History:  Procedure Laterality Date   ANTERIOR CERVICAL DECOMP/DISCECTOMY FUSION  1991   dr Newell Coral   C5 -- 7  (per pt fusion failed)   CARPAL TUNNEL RELEASE Right done at same time of CMC fusion    CARPOMETACARPAL (CMC) FUSION OF THUMB Bilateral 2015 and 2014  approx.   CATARACT EXTRACTION W/ INTRAOCULAR LENS  IMPLANT, BILATERAL  2013   LYMPHADENECTOMY Bilateral 11/16/2018   Procedure: LYMPHADENECTOMY, PELVIC;  Surgeon: Heloise Purpura, MD;  Location: [facility];  Service: Urology;  Laterality: Bilateral;   ROBOT ASSISTED LAPAROSCOPIC RADICAL PROSTATECTOMY N/A 11/16/2018   Procedure: XI ROBOTIC ASSISTED LAPAROSCOPIC RADICAL PROSTATECTOMY LEVEL 2;  Surgeon: Heloise Purpura, MD;  Location: [facility];  Service: Urology;  Laterality: N/A;   SHOULDER SURGERY Left age 38   TONSILLECTOMY  child   Patient Active Problem List   Diagnosis Date Noted   Dyslipidemia 10/02/2023   Aortic root enlargement (HCC) 10/02/2023   Elevated coronary artery calcium score 10/02/2023   Sciatic pain, right 09/07/2023   Palpitations 09/27/2021   Cervicogenic headache 06/08/2021   Gait abnormality 06/08/2021   Prostate cancer (HCC) 11/16/2018   Malignant neoplasm of prostate (HCC) 09/18/2018   Daytime somnolence 08/04/2017   Dyspnea on exertion 08/04/2017   Exercise intolerance 08/04/2017   Need for influenza vaccination 07/01/2017   Ulnar neuropathy at elbow, 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   Essential hypertension 01/12/2017   Groin strain 11/18/2016   Irregular bowel habits 11/18/2016   Rheumatoid arthritis (  HCC) 01/22/2016   Chronic insomnia 12/31/2015   Chronic pain 12/31/2015   Esophageal reflux 12/31/2015   Hypercholesteremia 12/31/2015   Chronic arthritis 12/31/2015   Wrist pain, right 12/01/2015   IT band syndrome 05/06/2014   Cervical radiculopathy 07/14/2011   Arthritis pain, hand 06/02/2011   Osteoarthritis of CMC joint of thumb 06/02/2011    PCP: Barbie Banner, MD  REFERRING PROVIDER: Ivor Messier, MD  REFERRING DIAG: [phone] (ICD-10-CM) - Right hip pain  THERAPY DIAG:  Muscle weakness (generalized)  Pain in right hip  Radiculopathy, lumbosacral region  Cramp and spasm  Piriformis  syndrome, left  Rationale for Evaluation and Treatment: Rehabilitation  ONSET DATE: October 2024  SUBJECTIVE:   SUBJECTIVE STATEMENT: Patient reports he is doing okay today. He is still having increased hip pain with weight bearing activities.   PERTINENT HISTORY: RA, IT band syndrome, ACDF of C5-7 in 1991, Lumbar Laminectomy L5-S1 October 2023  PAIN: 11/14/2023 Are you having pain? Yes: NPRS scale: 5/10 Pain location: right buttock and hip Pain description: shooting, sharp, sometimes aching and radiating down RLE Aggravating factors: standing and walking Relieving factors: sitting  PRECAUTIONS: None  RED FLAGS: None   WEIGHT BEARING RESTRICTIONS: No  FALLS:  Has patient fallen in last 6 months? No  LIVING ENVIRONMENT: Lives with: lives with their spouse Lives in: House/apartment Stairs:  one story home Has following equipment at home: Grab bars  OCCUPATION: retired, formerly a Designer, television/film set  PLOF: Independent and Leisure: yard work  PATIENT GOALS: To be able to walk without hurting.  NEXT MD VISIT: Dr Rene Kocher on 10/26/2023, Rheumatologist on 11/21/2023  OBJECTIVE:  Note: Objective measures were completed at Evaluation unless otherwise noted.  DIAGNOSTIC FINDINGS:  Right Hip Radiograph on 10/06/2023: FINDINGS: There is no evidence of hip fracture or dislocation. No significant arthropathy. No bony lesions identified. The bony pelvis is unremarkable.  Lumbar MRI on 05/11/2022: IMPRESSION: 1. At L5-S1, severe proximal left foraminal stenosis with potential impingement. Moderate left subarticular recess stenosis. 2. At L4-L5, moderate canal and bilateral subarticular recess stenosis with mild right foraminal stenosis. 3. At L3-L4, mild canal stenosis with mild right foraminal stenosis.  PATIENT SURVEYS:  Eval:  FOTO 40 (projected 58 by visit 15)  COGNITION: Overall cognitive status: Within functional limits for tasks assessed     SENSATION: Reports some  numbness and tingling down his right leg, especially with prolonged standing.  MUSCLE LENGTH: Hamstrings: tightness noted bilat  POSTURE: rounded shoulders, forward head, and flexed trunk   PALPATION: Tenderness to palpation primarily right hip, but also some in his lumbar region  LOWER EXTREMITY ROM:  Eval:  WFL with some pain at end range  LOWER EXTREMITY MMT:  Eval:   Right hip strength grossly 4-/5 Right hamstring strength is 4/5 Right quad strength is 5-/5 Left LE strength is WFL   FUNCTIONAL TESTS:  Eval: 5 times sit to stand: 12.30 sec Timed up and go (TUG): 12.57 sec 3 minute walk test: 537 ft  GAIT: Distance walked: >500 ft Assistive device utilized: None Level of assistance: Complete Independence Comments: Patient with antalgic gait with decreased stance time on right LE  TODAY'S TREATMENT  DATE: 11/14/2023 Recumbent Bike Level 1 x7 min with PT present to discuss status Supine hamstring, hip adductor, and IT band stretch with strap 2x20 sec each bilat Prone hip extension with knees flexed 2 x 10 bilateral  Hooklying clamshell with yellow loop 2x10 Manual: Rt lateral hip distraction with belt 5 x 30 oscillations and long axis hip distraction 10 x 5 sec holds Hip joint assessment (SCOUR, FABIR, FADDIR)   DATE: 11/10/2023 Nustep level 5 (LE only) x7 min with PT present to discuss status Supine hamstring, hip adductor, and IT band stretch with strap 2x20 sec each bilat Hooklying transversus abdominus with pressing beach ball into thighs 2x10 Supine figure 4 piriformis stretch x20 sec bilat (with towel to assist) Hooklying clamshell with yellow loop 2x10 Manual Therapy in prone:  Addaday to bilateral glutes/piriformis, IT band, and hamstrings to promote tissue elongation and decreased pain   DATE: 11/08/2023 Nustep level 5 (LE only)  x6 min with PT present to discuss status Supine hamstring, hip adductor, and IT band stretch with strap 2x20 sec each bilat Prone on elbows x2 min (pt did not report any change in back pain, just a stretch to his hip flexors) Prone hip extension 2x10 bilat Standing hip flexor stretch with foot on PT mat x20 sec bilat Trigger Point Dry Needling Initial Treatment: Pt instructed on Dry Needling rational, procedures, and possible side effects. Pt instructed to expect mild to moderate muscle soreness later in the day and/or into the next day.  Pt instructed in methods to reduce muscle soreness. Pt instructed to continue prescribed HEP. Patient was educated on signs and symptoms of infection and other risk factors and advised to seek medical attention should they occur.  Patient verbalized understanding of these instructions and education.  Patient Verbal Consent Given: Yes Education Handout Provided: Yes Muscles Treated: bilateral lumbar multifidi, bilateral glutes/piriformis Electrical Stimulation Performed: No Treatment Response/Outcome: Utilized skilled palpation to identify bony landmarks and trigger points.  Able to illicit twitch response and muscle elongation.  Soft tissue mobilization with cocoa butter following to further promote tissue elongation.      PATIENT EDUCATION:  Education details: Issued HEP Person educated: Patient Education method: Explanation, Facilities manager, and Handouts Education comprehension: verbalized understanding  HOME EXERCISE PROGRAM: Access Code: [email or web address] URL: [email or web address] Date: 11/08/2023 Prepared by: Reather Laurence  Exercises - Seated Scapular Retraction  - 1 x daily - 7 x weekly - 2 sets - 10 reps - Seated Hamstring Stretch  - 1 x daily - 7 x weekly - 2 reps - 20 sec hold - Clamshell  - 1 x daily - 7 x weekly - 2 sets - 10 reps - Supine Lower Trunk Rotation  - 1 x daily - 7 x weekly - 1 sets - 10 reps - Supine Pelvic Tilt  - 1  x daily - 7 x weekly - 2 sets - 10 reps - Supine Piriformis Stretch with Foot on Ground  - 1 x daily - 7 x weekly - 2 reps - 20 sec hold - Supine Bridge  - 1 x daily - 7 x weekly - 2 sets - 10 reps - Supine March  - 1 x daily - 7 x weekly - 2 sets - 10 reps - Hip Flexion Stretch  - 1 x daily - 7 x weekly - 2 sets - 10 reps - Supine Hip Internal and External Rotation  - 1 x daily - 7 x weekly - 1 sets - 10 reps -  2 hold - Prone Hip Extension  - 1 x daily - 7 x weekly - 2 sets - 10 reps  ASSESSMENT:  CLINICAL IMPRESSION: Jeremaine continues to verbalize increased Rt hip pain with weight bearing activities. He verbalized relief with manual joint techniques, however the pain resumed when he stood. Patient verbalized he would schedule a follow up appointment with his MD after his 8 weeks is done with therapy. Patient was able to do yard work today, but he verbalized increased pain afterwards. Patient will benefit from skilled PT to address the below impairments and improve overall function.     OBJECTIVE IMPAIRMENTS: decreased balance, difficulty walking, decreased strength, increased muscle spasms, impaired flexibility, postural dysfunction, and pain.   ACTIVITY LIMITATIONS: lifting, bending, standing, and squatting  PARTICIPATION LIMITATIONS: driving, community activity, and yard work  PERSONAL FACTORS: Past/current experiences, Time since onset of injury/illness/exacerbation, and 3+ comorbidities: RA, IT band syndrome, Hx of cervical and lumbar surgeries  are also affecting patient's functional outcome.   REHAB POTENTIAL: Good  CLINICAL DECISION MAKING: Evolving/moderate complexity  EVALUATION COMPLEXITY: Moderate   GOALS: Goals reviewed with patient? Yes  SHORT TERM GOALS: Target date: 11/18/2023 Patient will be independent with initial HEP. Baseline: Goal status: Met on 11/08/23  2.  Patient will report at least a 25% improvement in symptoms since initial evaluation. Baseline:   Goal status: Met on 11/08/23   LONG TERM GOALS: Target date: 12/16/2023  Patient will be independent with advanced HEP to allow for self progression after discharge. Baseline:  Goal status: Ongoing  2.  Patient will increase FOTO to 58 to demonstrate improvements in functional mobility. Baseline: 40 Goal status: INITIAL  3.  Patient will increase right hip strength to [facility] to allow him to perform various tasks to return to being able to perform yard work. Baseline:  Goal status: INITIAL  4.  Patient to report ability to ambulate for at least 15 minutes without increased pain to allow community ambulation. Baseline:  Goal status: INITIAL    PLAN:  PT FREQUENCY: 2x/week  PT DURATION: 8 weeks  PLANNED INTERVENTIONS: 97164- PT Re-evaluation, 97110-Therapeutic exercises, 97530- Therapeutic activity, 97112- Neuromuscular re-education, 97535- Self Care, 16109- Manual therapy, [phone]- Gait training, [phone]- Canalith repositioning, [id number]- Aquatic Therapy, 97014- Electrical stimulation (unattended), [id number]- Electrical stimulation (manual), 97016- Vasopneumatic device, [id number]- Ultrasound, [id number]- Traction (mechanical), Patient/Family education, Balance training, Stair training, Taping, Dry Needling, Joint mobilization, Joint manipulation, Spinal manipulation, Spinal mobilization, Cryotherapy, Moist heat, and Biofeedback  PLAN FOR NEXT SESSION: assess response to treatment session; continue Glute strengthening, update HEP as indicated   Claude Manges, PT 11/14/23 4:32 PM [facility] [address] [place], [place] [address] Phone # [phone] Fax [phone]

## 2023-11-16 ENCOUNTER — Encounter: Payer: Self-pay | Admitting: Family Medicine

## 2023-11-16 ENCOUNTER — Ambulatory Visit: Payer: Medicare Other | Admitting: Rehabilitative and Restorative Service Providers"

## 2023-11-16 ENCOUNTER — Encounter: Payer: Self-pay | Admitting: Rehabilitative and Restorative Service Providers"

## 2023-11-16 DIAGNOSIS — M25551 Pain in right hip: Secondary | ICD-10-CM

## 2023-11-16 DIAGNOSIS — M6281 Muscle weakness (generalized): Secondary | ICD-10-CM

## 2023-11-16 DIAGNOSIS — G5702 Lesion of sciatic nerve, left lower limb: Secondary | ICD-10-CM

## 2023-11-16 DIAGNOSIS — M5417 Radiculopathy, lumbosacral region: Secondary | ICD-10-CM

## 2023-11-16 DIAGNOSIS — R252 Cramp and spasm: Secondary | ICD-10-CM

## 2023-11-16 NOTE — Therapy (Addendum)
 OUTPATIENT PHYSICAL THERAPY TREATMENT NOTE AND LATE ENTRY DISCHARGE SUMMARY   Patient Name: Isaiah Price MRN: [id number] DOB:10-12-1951, 73 y.o., male Today's Date: 11/16/2023  END OF SESSION:  PT End of Session - 11/16/23 1015     Visit Number 8    Date for PT Re-Evaluation 12/16/23    Authorization Type Medicare/ BCBC    Progress Note Due on Visit 10    PT Start Time 1012    PT Stop Time 1050    PT Time Calculation (min) 38 min    Activity Tolerance Patient tolerated treatment well    Behavior During Therapy WFL for tasks assessed/performed                 Past Medical History:  Diagnosis Date   Achilles tendinitis, left leg    At risk for sleep apnea    STOP--BANG SCORE= 5  (routed to pt's pcp in epic 11-13-2018)   Cervicogenic headache 06/08/2021   Chronic insomnia 12/31/2015   Chronic neck pain    Congenital pectus excavatum    per pt had pulmonary test ,  60% restrictive by states asymptomatic   DDD (degenerative disc disease), cervical    Epicondylitis, lateral, right    Gait abnormality 06/08/2021   GERD (gastroesophageal reflux disease)    History of exercise intolerance    09-02-2017  ETT,  negative for ischemia   History of pericarditis 1985   HTN (hypertension)    Hypercholesteremia 12/31/2015   Iritis    IT band syndrome 05/06/2014   Lower urinary tract symptoms (LUTS)    Neuropathy    bilateral arms with tingling and numbness at times due to cervical neck   OA (osteoarthritis)    Polyarthralgia    Prostate cancer [facility])    urologist-  dr Laverle Patter--- Stage T1c, Gleason 3+4, 06/08/21 recurrent prostate cancer   Rheumatoid arthritis [facility])    rheumatologist-- dr Dierdre Forth--- seropositive,multiple sites   Tendon disorder 2022   torn tendon, right arm   Wears glasses    Past Surgical History:  Procedure Laterality Date   ANTERIOR CERVICAL DECOMP/DISCECTOMY FUSION  1991   dr Newell Coral   C5 -- 7  (per pt fusion failed)   CARPAL TUNNEL RELEASE Right  done at same time of CMC fusion   CARPOMETACARPAL (CMC) FUSION OF THUMB Bilateral 2015 and 2014  approx.   CATARACT EXTRACTION W/ INTRAOCULAR LENS  IMPLANT, BILATERAL  2013   LYMPHADENECTOMY Bilateral 11/16/2018   Procedure: LYMPHADENECTOMY, PELVIC;  Surgeon: Heloise Purpura, MD;  Location: [facility];  Service: Urology;  Laterality: Bilateral;   ROBOT ASSISTED LAPAROSCOPIC RADICAL PROSTATECTOMY N/A 11/16/2018   Procedure: XI ROBOTIC ASSISTED LAPAROSCOPIC RADICAL PROSTATECTOMY LEVEL 2;  Surgeon: Heloise Purpura, MD;  Location: [facility];  Service: Urology;  Laterality: N/A;   SHOULDER SURGERY Left age 62   TONSILLECTOMY  child   Patient Active Problem List   Diagnosis Date Noted   Dyslipidemia 10/02/2023   Aortic root enlargement (HCC) 10/02/2023   Elevated coronary artery calcium score 10/02/2023   Sciatic pain, right 09/07/2023   Palpitations 09/27/2021   Cervicogenic headache 06/08/2021   Gait abnormality 06/08/2021   Prostate cancer (HCC) 11/16/2018   Malignant neoplasm of prostate (HCC) 09/18/2018   Daytime somnolence 08/04/2017   Dyspnea on exertion 08/04/2017   Exercise intolerance 08/04/2017   Need for influenza vaccination 07/01/2017   Ulnar neuropathy at elbow, 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   Essential hypertension 01/12/2017   Groin strain 11/18/2016   Irregular bowel habits  11/18/2016   Rheumatoid arthritis (HCC) 01/22/2016   Chronic insomnia 12/31/2015   Chronic pain 12/31/2015   Esophageal reflux 12/31/2015   Hypercholesteremia 12/31/2015   Chronic arthritis 12/31/2015   Wrist pain, right 12/01/2015   IT band syndrome 05/06/2014   Cervical radiculopathy 07/14/2011   Arthritis pain, hand 06/02/2011   Osteoarthritis of CMC joint of thumb 06/02/2011    PCP: Barbie Banner, MD  REFERRING PROVIDER: Ivor Messier, MD  REFERRING DIAG: [id number] (ICD-10-CM) - Right hip pain  THERAPY DIAG:  Muscle weakness (generalized)  Pain in right hip  Radiculopathy, lumbosacral  region  Cramp and spasm  Piriformis syndrome, left  Rationale for Evaluation and Treatment: Rehabilitation  ONSET DATE: October 2024  SUBJECTIVE:   SUBJECTIVE STATEMENT: Patient reports he felt good shortly after his last PT session, but he continues to have the same level of pain when he bears weight through his right leg.  PERTINENT HISTORY: RA, IT band syndrome, ACDF of C5-7 in 1991, Lumbar Laminectomy L5-S1 October 2023  PAIN: 11/14/2023 Are you having pain? Yes: NPRS scale: 0-5/10 Pain location: right buttock and hip Pain description: shooting, sharp, sometimes aching and radiating down RLE Aggravating factors: standing and walking Relieving factors: sitting  PRECAUTIONS: None  RED FLAGS: None   WEIGHT BEARING RESTRICTIONS: No  FALLS:  Has patient fallen in last 6 months? No  LIVING ENVIRONMENT: Lives with: lives with their spouse Lives in: House/apartment Stairs:  one story home Has following equipment at home: Grab bars  OCCUPATION: retired, formerly a Designer, television/film set  PLOF: Independent and Leisure: yard work  PATIENT GOALS: To be able to walk without hurting.  NEXT MD VISIT: Dr Rene Kocher on 10/26/2023, Rheumatologist on 11/21/2023  OBJECTIVE:  Note: Objective measures were completed at Evaluation unless otherwise noted.  DIAGNOSTIC FINDINGS:  Right Hip Radiograph on 10/06/2023: FINDINGS: There is no evidence of hip fracture or dislocation. No significant arthropathy. No bony lesions identified. The bony pelvis is unremarkable.  Lumbar MRI on 05/11/2022: IMPRESSION: 1. At L5-S1, severe proximal left foraminal stenosis with potential impingement. Moderate left subarticular recess stenosis. 2. At L4-L5, moderate canal and bilateral subarticular recess stenosis with mild right foraminal stenosis. 3. At L3-L4, mild canal stenosis with mild right foraminal stenosis.  PATIENT SURVEYS:  Eval:  FOTO 40 (projected 58 by visit 15) 11/16/2023:  FOTO  47  COGNITION: Overall cognitive status: Within functional limits for tasks assessed     SENSATION: Reports some numbness and tingling down his right leg, especially with prolonged standing.  MUSCLE LENGTH: Hamstrings: tightness noted bilat  POSTURE: rounded shoulders, forward head, and flexed trunk   PALPATION: Tenderness to palpation primarily right hip, but also some in his lumbar region  LOWER EXTREMITY ROM:  Eval:  WFL with some pain at end range  LOWER EXTREMITY MMT:  Eval:   Right hip strength grossly 4-/5 Right hamstring strength is 4/5 Right quad strength is 5-/5 Left LE strength is WFL   FUNCTIONAL TESTS:  Eval: 5 times sit to stand: 12.30 sec Timed up and go (TUG): 12.57 sec 3 minute walk test: 537 ft  GAIT: Distance walked: >500 ft Assistive device utilized: None Level of assistance: Complete Independence Comments: Patient with antalgic gait with decreased stance time on right LE  TODAY'S TREATMENT   DATE: 11/16/2023 Nustep level 5 (LE only) x8 min with PT present to discuss status Supine hamstring, hip adductor, and IT band stretch with strap 2x20 sec each bilat Supine piriformis stretch with strap to assist 2x20 sec bilat Prone hip extension 2x10 bilat Attempted childs pose on bent elbows, but unable to complete secondary to pain Seated modified dead lift with 10# kettlebell 2x10 (with cuing for improved technique) Hooklying clamshell with yellow loop 2x10   DATE: 11/14/2023 Recumbent Bike Level 1 x7 min with PT present to discuss status Supine hamstring, hip adductor, and IT band stretch with strap 2x20 sec each bilat Prone hip extension with knees flexed 2 x 10 bilateral  Hooklying clamshell with yellow loop 2x10 Manual: Rt lateral hip distraction with belt 5 x 30 oscillations and long axis hip distraction 10 x 5 sec  holds Hip joint assessment (SCOUR, FABIR, FADDIR)   DATE: 11/10/2023 Nustep level 5 (LE only) x7 min with PT present to discuss status Supine hamstring, hip adductor, and IT band stretch with strap 2x20 sec each bilat Hooklying transversus abdominus with pressing beach ball into thighs 2x10 Supine figure 4 piriformis stretch x20 sec bilat (with towel to assist) Hooklying clamshell with yellow loop 2x10 Manual Therapy in prone:  Addaday to bilateral glutes/piriformis, IT band, and hamstrings to promote tissue elongation and decreased pain    PATIENT EDUCATION:  Education details: Issued HEP Person educated: Patient Education method: Explanation, Demonstration, and Handouts Education comprehension: verbalized understanding  HOME EXERCISE PROGRAM: Access Code: [email or web address] URL: [email or web address] Date: 11/08/2023 Prepared by: Reather Laurence  Exercises - Seated Scapular Retraction  - 1 x daily - 7 x weekly - 2 sets - 10 reps - Seated Hamstring Stretch  - 1 x daily - 7 x weekly - 2 reps - 20 sec hold - Clamshell  - 1 x daily - 7 x weekly - 2 sets - 10 reps - Supine Lower Trunk Rotation  - 1 x daily - 7 x weekly - 1 sets - 10 reps - Supine Pelvic Tilt  - 1 x daily - 7 x weekly - 2 sets - 10 reps - Supine Piriformis Stretch with Foot on Ground  - 1 x daily - 7 x weekly - 2 reps - 20 sec hold - Supine Bridge  - 1 x daily - 7 x weekly - 2 sets - 10 reps - Supine March  - 1 x daily - 7 x weekly - 2 sets - 10 reps - Hip Flexion Stretch  - 1 x daily - 7 x weekly - 2 sets - 10 reps - Supine Hip Internal and External Rotation  - 1 x daily - 7 x weekly - 1 sets - 10 reps - 2 hold - Prone Hip Extension  - 1 x daily - 7 x weekly - 2 sets - 10 reps  ASSESSMENT:  CLINICAL IMPRESSION: Mr Adami presents to skilled PT reporting that he did feel better initially with the manual therapy, however, the pain continues to return when he bears weight through his right LE.  Patient has had  slight increase in his FOTO score since initial evaluation.  Given patient is continuing to have increased pain, recommended patient call his MD for further work-up.  Patient states that he is going to contact the MD when he leaves the clinic.  Patient unable to go to quadruped position secondary to increased pain.  Will progress further pending advice from MD.     OBJECTIVE  IMPAIRMENTS: decreased balance, difficulty walking, decreased strength, increased muscle spasms, impaired flexibility, postural dysfunction, and pain.   ACTIVITY LIMITATIONS: lifting, bending, standing, and squatting  PARTICIPATION LIMITATIONS: driving, community activity, and yard work  PERSONAL FACTORS: Past/current experiences, Time since onset of injury/illness/exacerbation, and 3+ comorbidities: RA, IT band syndrome, Hx of cervical and lumbar surgeries  are also affecting patient's functional outcome.   REHAB POTENTIAL: Good  CLINICAL DECISION MAKING: Evolving/moderate complexity  EVALUATION COMPLEXITY: Moderate   GOALS: Goals reviewed with patient? Yes  SHORT TERM GOALS: Target date: 11/18/2023 Patient will be independent with initial HEP. Baseline: Goal status: Met on 11/08/23  2.  Patient will report at least a 25% improvement in symptoms since initial evaluation. Baseline:  Goal status: Met on 11/08/23   LONG TERM GOALS: Target date: 12/16/2023  Patient will be independent with advanced HEP to allow for self progression after discharge. Baseline:  Goal status: Ongoing  2.  Patient will increase FOTO to 58 to demonstrate improvements in functional mobility. Baseline: 40 Goal status: Ongoing  3.  Patient will increase right hip strength to [facility] to allow him to perform various tasks to return to being able to perform yard work. Baseline:  Goal status: INITIAL  4.  Patient to report ability to ambulate for at least 15 minutes without increased pain to allow community ambulation. Baseline:  Goal  status: Ongoing    PLAN:  PT FREQUENCY: 2x/week  PT DURATION: 8 weeks  PLANNED INTERVENTIONS: 97164- PT Re-evaluation, 97110-Therapeutic exercises, 97530- Therapeutic activity, [id number]- Neuromuscular re-education, 97535- Self Care, 65784- Manual therapy, [phone]- Gait training, [phone]- Canalith repositioning, [id number]- Aquatic Therapy, 97014- Electrical stimulation (unattended), [id number]- Electrical stimulation (manual), 97016- Vasopneumatic device, [id number]- Ultrasound, [id number]- Traction (mechanical), Patient/Family education, Balance training, Stair training, Taping, Dry Needling, Joint mobilization, Joint manipulation, Spinal manipulation, Spinal mobilization, Cryotherapy, Moist heat, and Biofeedback  PLAN FOR NEXT SESSION: assess response to treatment session; continue Glute strengthening, update HEP as indicated, ask about MD recommendations   Reather Laurence, PT, DPT 11/16/23, 10:58 AM  [facility] [address], [address] [place], [place] [address] Phone # [phone] Fax [phone]   PHYSICAL THERAPY DISCHARGE SUMMARY  As of 12/14/23, patient has not returned for further visits and discharged from PT at this time.  Please see above for most recent PT update.  Patient went for MD follow up, as described above and went for further imaging.  MRI of his lumbar spine revealed worsening of his lumbar stenosis and he is going to follow up with his surgeon.  Patient agrees to discharge. Patient goals were partially met. Patient is being discharged due to not returning since the last visit to follow up with MD.  Reather Laurence, PT, DPT 12/14/23, 12:18 PM

## 2023-11-20 ENCOUNTER — Encounter: Payer: Self-pay | Admitting: Rehabilitative and Restorative Service Providers"

## 2023-11-21 ENCOUNTER — Encounter: Payer: Medicare Other | Admitting: Internal Medicine

## 2023-11-22 ENCOUNTER — Encounter: Payer: Medicare Other | Admitting: Physical Therapy

## 2023-11-23 ENCOUNTER — Encounter: Payer: Self-pay | Admitting: Family Medicine

## 2023-11-23 ENCOUNTER — Ambulatory Visit
Admission: RE | Admit: 2023-11-23 | Discharge: 2023-11-23 | Disposition: A | Discharge status: Home / Self Care | Payer: Medicare Other | Source: Ambulatory Visit | Attending: Family Medicine | Admitting: Family Medicine

## 2023-11-23 ENCOUNTER — Ambulatory Visit: Payer: Medicare Other | Admitting: Family Medicine

## 2023-11-23 VITALS — BP 122/88 | Ht 76.0 in | Wt 210.0 lb

## 2023-11-23 DIAGNOSIS — M5416 Radiculopathy, lumbar region: Secondary | ICD-10-CM | POA: Diagnosis not present

## 2023-11-23 DIAGNOSIS — M5431 Sciatica, right side: Secondary | ICD-10-CM

## 2023-11-23 NOTE — Progress Notes (Signed)
 Isaiah Price - 73 y.o. male MRN [id number]  Date of birth: 02-Mar-1951  PCP: Tanda Prentice DEL, MD  Subjective:  No chief complaint on file. Sciatic pain  HPI: Past Medical, Surgical, Social, and Family History Reviewed & Updated per EMR.   Patient is a 73 y.o. male here for follow up on right sided sciatic pain, last seen on 10/05/2023. The patient has tried physical therapy which initially helped with some strengthening of the hip abductors but recently he has been unable to perform the exercises as he is having increased pain.  Physical therapy recommended him following up.  He reports increased shooting pain down the back of the leg that is now intermittently going down to the foot as well as some lesser symptoms on the left side.  The pain resolves with seated position and worsens with standing and walking.  He has not had any loss of bowel or bladder, saddle anesthesia, weakness or falls.  The pain over his lateral hip has resolved as well as the pain over his ischial tuberosity.  He denies any fever, chills, or unexpected weight loss.  Past Medical History:  Diagnosis Date   Achilles tendinitis, left leg    At risk for sleep apnea    STOP--BANG SCORE= 5  (routed to pt's pcp in epic 11-13-2018)   Cervicogenic headache 06/08/2021   Chronic insomnia 12/31/2015   Chronic neck pain    Congenital pectus excavatum    per pt had pulmonary test ,  60% restrictive by states asymptomatic   DDD (degenerative disc disease), cervical    Epicondylitis, lateral, right    Gait abnormality 06/08/2021   GERD (gastroesophageal reflux disease)    History of exercise intolerance    09-02-2017  ETT,  negative for ischemia   History of pericarditis 1985   HTN (hypertension)    Hypercholesteremia 12/31/2015   Iritis    IT band syndrome 05/06/2014   Lower urinary tract symptoms (LUTS)    Neuropathy    bilateral arms with tingling and numbness at times due to cervical neck   OA (osteoarthritis)     Polyarthralgia    Prostate cancer [facility])    urologist-  dr renda--- Stage T1c, Gleason 3+4, 06/08/21 recurrent prostate cancer   Rheumatoid arthritis [facility])    rheumatologist-- dr mai--- seropositive,multiple sites   Tendon disorder 2022   torn tendon, right arm   Wears glasses     Current Outpatient Medications on File Prior to Visit  Medication Sig Dispense Refill   allopurinol  (ZYLOPRIM ) 100 MG tablet Take 200 mg by mouth daily.     diltiazem  (CARDIZEM ) 90 MG tablet Take 90 mg by mouth at bedtime.      fluticasone  (FLONASE ) 50 MCG/ACT nasal spray Place 1 spray into the nose daily as needed for allergies.      HYDROcodone -acetaminophen  (NORCO) 10-325 MG tablet TAKE 1 TABLET EVERY 6 HOURS AS NEEDED FOR MODERATE TO SEVERE PAIN. 20 tablet 0   nortriptyline  (PAMELOR ) 10 MG capsule Take 2 capsules at night 180 capsule 3   omeprazole (PRILOSEC) 40 MG capsule Take 40 mg by mouth daily.      Polyvinyl Alcohol-Povidone (REFRESH OP) Place 1 drop into both eyes 2 (two) times daily as needed (dry eyes).      pregabalin  (LYRICA ) 25 MG capsule Take 2 capsules (50 mg total) by mouth 2 (two) times daily. 120 capsule 1   rosuvastatin  (CRESTOR ) 40 MG tablet TAKE 1 TABLET BY MOUTH DAILY 90 tablet 3  traMADol  (ULTRAM ) 50 MG tablet Take 1 tablet (50 mg total) by mouth every 8 (eight) hours as needed (pain). TAKE (1) TABLET EVERY EIGHT HOURS AS NEEDED FOR PAIN. 60 tablet 0   tretinoin  (RETIN-A ) 0.05 % cream Apply a thin layer to face nightly     zolpidem  (AMBIEN ) 10 MG tablet Take 10 mg by mouth at bedtime as needed for sleep.     Current Facility-Administered Medications on File Prior to Visit  Medication Dose Route Frequency Provider Last Rate Last Admin   hydrocortisone  sodium succinate (SOLU-CORTEF ) injection 100 mg  100 mg Intravenous Once Renda Glance, MD        Past Surgical History:  Procedure Laterality Date   ANTERIOR CERVICAL DECOMP/DISCECTOMY FUSION  1   dr alix   C5 -- 7  (per  pt fusion failed)   CARPAL TUNNEL RELEASE Right done at same time of CMC fusion   CARPOMETACARPAL (CMC) FUSION OF THUMB Bilateral 2015 and 2014  approx.   CATARACT EXTRACTION W/ INTRAOCULAR LENS  IMPLANT, BILATERAL  2013   LYMPHADENECTOMY Bilateral 11/16/2018   Procedure: LYMPHADENECTOMY, PELVIC;  Surgeon: Renda Glance, MD;  Location: [facility];  Service: Urology;  Laterality: Bilateral;   ROBOT ASSISTED LAPAROSCOPIC RADICAL PROSTATECTOMY N/A 11/16/2018   Procedure: XI ROBOTIC ASSISTED LAPAROSCOPIC RADICAL PROSTATECTOMY LEVEL 2;  Surgeon: Renda Glance, MD;  Location: [facility];  Service: Urology;  Laterality: N/A;   SHOULDER SURGERY Left age 26   TONSILLECTOMY  child    Allergies  Allergen Reactions   Nsaids Other (See Comments)    Causes GI bleeds (numerous times)   Brimonidine Itching and Other (See Comments)    redness Abdominal Pain   Gabapentin  Other (See Comments)    Couldn't talk   Meloxicam Other (See Comments)   Promethazine  Hcl     Vision changes   Trazodone     Other reaction(s): Other (See Comments) Bladder incontinence        Objective:  Physical Exam: VS: BP:122/88  HR: bpm  TEMP: ( )  RESP:   HT:6' 4 (193 cm)   WT:210 lb (95.3 kg)  BMI:25.57  Gen: NAD, speaks clearly, comfortable in exam room Respiratory: Normal respiratory effort on room air. No signs of distress Skin: No rashes, abrasions, or ecchymosis MSK:  Right Hip: Gate antalgic and with a slight forward bend at the waist ROM: Normal Strength Flexion: 5/5, Extension: 5/5, Abduction: 4/5 Greater trochanter NT No tenderness over piriformis.  FADIR negative for anterior hip pain, log roll negative  Back Exam:  Inspection: Marked loss of lumbar lordosis with decreased gluteus muscle mass bilaterally Palpation over the lumbar area does not elicit pain XSLR seated: Negative  Sensory change: Gross sensation intact to all lumbar and sacral dermatomes.  Reflexes: 2+ at both patellar tendons, 2+ at  achilles tendons, Babinski's downgoing.  Strength at foot  Plantar-flexion: 5/5 Dorsi-flexion: 5/5 Eversion: 5/5 Inversion: 5/5  Leg strength    Assessment & Plan:   Sciatic pain, right - Signe has had some success with his right lateral hip and his abductor muscles have gotten stronger.  Unfortunately after his initial improvement he has got worse and has been unable to perform physical therapy further. - He is now describing radicular pain involving the lower extremity down to the feet with some left-sided involvement as well. - He has received targeted therapy for possible sciatic impingement at the hip.  At this point I believe his symptoms are related to spinal stenosis - As reminder  his MRI in 2023 showed L5-S1 severe proximal left foraminal stenosis, L4-L5 moderate canal stenosis with mild right foraminal stenosis, and L3-L4 mild canal and right foraminal stenosis. -We will get an updated MRI and follow-up with him on the results.  I encouraged him to continue his hip abductor strengthening exercises at home.    Lonni Ford MD [facility] Sports Medicine Fellow

## 2023-11-23 NOTE — Assessment & Plan Note (Signed)
-   Isaiah Price has had some success with his right lateral hip and his abductor muscles have gotten stronger.  Unfortunately after his initial improvement he has got worse and has been unable to perform physical therapy further. - He is now describing radicular pain involving the lower extremity down to the feet with some left-sided involvement as well. - He has received targeted therapy for possible sciatic impingement at the hip.  At this point I believe his symptoms are related to spinal stenosis - As reminder his MRI in 2023 showed L5-S1 severe proximal left foraminal stenosis, L4-L5 moderate canal stenosis with mild right foraminal stenosis, and L3-L4 mild canal and right foraminal stenosis. -We will get an updated MRI and follow-up with him on the results.  I encouraged him to continue his hip abductor strengthening exercises at home.

## 2023-11-24 ENCOUNTER — Encounter: Payer: Medicare Other | Admitting: Rehabilitative and Restorative Service Providers"

## 2023-11-28 ENCOUNTER — Other Ambulatory Visit: Payer: Self-pay | Admitting: Sports Medicine

## 2023-11-29 ENCOUNTER — Encounter: Payer: Medicare Other | Admitting: Rehabilitative and Restorative Service Providers"

## 2023-12-02 ENCOUNTER — Encounter: Payer: Medicare Other | Admitting: Physical Therapy

## 2023-12-05 ENCOUNTER — Encounter: Payer: Self-pay | Admitting: Family Medicine

## 2023-12-06 ENCOUNTER — Other Ambulatory Visit: Payer: Self-pay | Admitting: Family Medicine

## 2023-12-06 ENCOUNTER — Encounter: Payer: Medicare Other | Admitting: Rehabilitative and Restorative Service Providers"

## 2023-12-06 MED ORDER — HYDROCODONE-ACETAMINOPHEN 10-325 MG PO TABS: ORAL_TABLET | ORAL | 0 refills | Status: DC

## 2023-12-06 NOTE — Progress Notes (Signed)
The patient requested refill of Norco for breakthrough pain.  Reviewing the charts for previous prescriptions, the patient's symptoms at his last visit, and overall clinical picture I believe this is appropriate.  Medication was refilled today.

## 2023-12-07 ENCOUNTER — Encounter: Payer: Self-pay | Admitting: Family Medicine

## 2023-12-08 ENCOUNTER — Other Ambulatory Visit: Payer: Self-pay | Admitting: Neurology

## 2023-12-08 ENCOUNTER — Encounter: Payer: Medicare Other | Admitting: Rehabilitative and Restorative Service Providers"

## 2023-12-12 ENCOUNTER — Encounter: Payer: Medicare Other | Admitting: Rehabilitative and Restorative Service Providers"

## 2023-12-15 ENCOUNTER — Encounter: Payer: Medicare Other | Admitting: Rehabilitative and Restorative Service Providers"

## 2023-12-20 NOTE — Therapy (Signed)
 OUTPATIENT PHYSICAL THERAPY LUMBAR EVALUATION   Patient Name: Isaiah Price MRN: [id number] DOB:Apr 26, 1951, 73 y.o., male Today's Date: 12/21/2023  END OF SESSION:  PT End of Session - 12/21/23 1222     Visit Number 1    Date for PT Re-Evaluation 02/15/24    Authorization Type Medicare/ BCBC    Progress Note Due on Visit 10    PT Start Time 1149    PT Stop Time 1225    PT Time Calculation (min) 36 min    Activity Tolerance Patient tolerated treatment well    Behavior During Therapy WFL for tasks assessed/performed              Past Medical History:  Diagnosis Date   Achilles tendinitis, left leg    At risk for sleep apnea    STOP--BANG SCORE= 5  (routed to pt's pcp in epic 11-13-2018)   Cervicogenic headache 06/08/2021   Chronic insomnia 12/31/2015   Chronic neck pain    Congenital pectus excavatum    per pt had pulmonary test ,  60% restrictive by states asymptomatic   DDD (degenerative disc disease), cervical    Epicondylitis, lateral, right    Gait abnormality 06/08/2021   GERD (gastroesophageal reflux disease)    History of exercise intolerance    09-02-2017  ETT,  negative for ischemia   History of pericarditis 1985   HTN (hypertension)    Hypercholesteremia 12/31/2015   Iritis    IT band syndrome 05/06/2014   Lower urinary tract symptoms (LUTS)    Neuropathy    bilateral arms with tingling and numbness at times due to cervical neck   OA (osteoarthritis)    Polyarthralgia    Prostate cancer [facility])    urologist-  dr Laverle Patter--- Stage T1c, Gleason 3+4, 06/08/21 recurrent prostate cancer   Rheumatoid arthritis [facility])    rheumatologist-- dr Dierdre Forth--- seropositive,multiple sites   Tendon disorder 2022   torn tendon, right arm   Wears glasses    Past Surgical History:  Procedure Laterality Date   ANTERIOR CERVICAL DECOMP/DISCECTOMY FUSION  1991   dr Newell Coral   C5 -- 7  (per pt fusion failed)   CARPAL TUNNEL RELEASE Right done at same time of CMC fusion    CARPOMETACARPAL (CMC) FUSION OF THUMB Bilateral 2015 and 2014  approx.   CATARACT EXTRACTION W/ INTRAOCULAR LENS  IMPLANT, BILATERAL  2013   LYMPHADENECTOMY Bilateral 11/16/2018   Procedure: LYMPHADENECTOMY, PELVIC;  Surgeon: Heloise Purpura, MD;  Location: [facility];  Service: Urology;  Laterality: Bilateral;   ROBOT ASSISTED LAPAROSCOPIC RADICAL PROSTATECTOMY N/A 11/16/2018   Procedure: XI ROBOTIC ASSISTED LAPAROSCOPIC RADICAL PROSTATECTOMY LEVEL 2;  Surgeon: Heloise Purpura, MD;  Location: [facility];  Service: Urology;  Laterality: N/A;   SHOULDER SURGERY Left age 43   TONSILLECTOMY  child   Patient Active Problem List   Diagnosis Date Noted   Dyslipidemia 10/02/2023   Aortic root enlargement (HCC) 10/02/2023   Elevated coronary artery calcium score 10/02/2023   Sciatic pain, right 09/07/2023   Palpitations 09/27/2021   Cervicogenic headache 06/08/2021   Gait abnormality 06/08/2021   Prostate cancer (HCC) 11/16/2018   Malignant neoplasm of prostate (HCC) 09/18/2018   Daytime somnolence 08/04/2017   Dyspnea on exertion 08/04/2017   Exercise intolerance 08/04/2017   Need for influenza vaccination 07/01/2017   Ulnar neuropathy at elbow, 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   Essential hypertension 01/12/2017   Groin strain 11/18/2016   Irregular bowel habits 11/18/2016   Rheumatoid arthritis (HCC) 01/22/2016  Chronic insomnia 12/31/2015   Chronic pain 12/31/2015   Esophageal reflux 12/31/2015   Hypercholesteremia 12/31/2015   Chronic arthritis 12/31/2015   Wrist pain, right 12/01/2015   IT band syndrome 05/06/2014   Cervical radiculopathy 07/14/2011   Arthritis pain, hand 06/02/2011   Osteoarthritis of CMC joint of thumb 06/02/2011    PCP: Barbie Banner, MD  REFERRING PROVIDER: Ivor Messier, MD  REFERRING DIAG:  Diagnosis  M43.16 (ICD-10-CM) - Spondylolisthesis at L4-L5 level   THERAPY DIAG:  Muscle weakness (generalized) - Plan: PT plan of care cert/re-cert  Pain in right hip - Plan:  PT plan of care cert/re-cert  Radiculopathy, lumbosacral region - Plan: PT plan of care cert/re-cert  Cramp and spasm - Plan: PT plan of care cert/re-cert  Other low back pain - Plan: PT plan of care cert/re-cert  Rationale for Evaluation and Treatment: Rehabilitation  ONSET DATE: October 2024  SUBJECTIVE:   SUBJECTIVE STATEMENT Pt returns to PT today after seeing Dr Danielle Dess and having MRI. Continued Rt LE weakness  Pt with spondylolisthesis at L4-5 and significant spinal stenosis and MD has recommended injections and return to PT at this time. He is a candidate for spinal decompression surgery per MD note. Pt reports that he knows he will need surgery and would like to try to put this off for a little while if possible.  He is limited with standing tasks due to pain in his low back and leg.  PERTINENT HISTORY: RA, IT band syndrome, ACDF of C5-7 in 1991, Lumbar Laminectomy L5-S1 October 2023  PAIN: 12/21/23 Are you having pain? Yes: NPRS scale: 4-8/10 Pain location: low back, right buttock and hip Pain description: shooting, sharp, sometimes aching and radiating down Rt LE Aggravating factors: standing and walking Relieving factors: sitting  PRECAUTIONS: None  RED FLAGS: None   WEIGHT BEARING RESTRICTIONS: No  FALLS:  Has patient fallen in last 6 months? No  LIVING ENVIRONMENT: Lives with: lives with their spouse Lives in: House/apartment Stairs:  one story home Has following equipment at home: Grab bars  OCCUPATION: retired, formerly a Designer, television/film set  PLOF: Independent and Leisure: yard work Standing: 3-5 minutes static, moving around 5-8 min   The Patient-Specific Functional Scale  Initial:  I am going to ask you to identify up to 3 important activities that you are unable to do or are having difficulty with as a result of this problem.  Today are there any activities that you are unable to do or having difficulty with because of this?  (Patient shown scale and  patient rated each activity)  Follow up: When you first came in you had difficulty performing these activities.  Today do you still have difficulty?  Patient-Specific activity scoring scheme (Point to one number): PSFS  0 1 2 3 4 5 6 7 8 9  10 Unable                                                                                                          Able to perform To perform  activity at the same Activity         Level as before                                                                                                                       Injury or problem  Activity  Unloading dishwasher                                          Initial: 5/10                      follow up:  2.   Standing for  shower                                            Initial:  3-4/10                     follow up:   PATIENT GOALS: To be able to walk/stand without hurting, avoid surgery for now.   NEXT MD VISIT:   OBJECTIVE:  Note: Objective measures were completed at Evaluation unless otherwise noted.  DIAGNOSTIC FINDINGS:  Right Hip Radiograph on 10/06/2023: FINDINGS: There is no evidence of hip fracture or dislocation. No significant arthropathy. No bony lesions identified. The bony pelvis is unremarkable.  Lumbar MRI on 11/23/23:  1. 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 2. Interval left laminectomy at L5-S1 without residual lateral recess stenosis. Unchanged severe left neural foraminal stenosis. 3. 3.2 cm abdominal aortic aneurysm. Recommend surveillance ultrasound in 3 years.  PATIENT SURVEYS:  Eval: Modified Oswestry: 19/50= 38% limitation  COGNITION: Overall cognitive status: Within functional limits for tasks assessed     SENSATION: Reports some numbness and tingling down his right leg, especially with prolonged standing.  MUSCLE LENGTH: Hamstrings:  tightness noted bilat  POSTURE: rounded shoulders, forward head, and flexed trunk   PALPATION: Tenderness to palpation Rt gluteals and Lt lumbar spine  LOWER EXTREMITY ROM: Lumbar A/ROM: flexion full without pain, extension to neutral with Rt LE pain, Lt sidebending full without pain, Rt sidebending to neutral with pain  Rt hip flexibility: unable to perform figure 4 on Rt in sitting  LOWER EXTREMITY MMT:  Eval:   Right hip strength grossly 4-/5 Right hamstring strength is 4/5 Right quad strength is 5-/5 Left LE strength is WFL  GAIT: Distance walked: >500 ft Assistive device utilized: None Level of assistance: Complete Independence Comments: Patient with antalgic gait with decreased stance time on right LE  TODAY'S TREATMENT  DATE: 12/20/2023  Findings from evaluation discussed, pt educated on plan of care, HEP initiated.  Modified HEP to include more flexion based exercise  PATIENT EDUCATION:  Education details: Research scientist (medical) Person educated: Patient Education method: Explanation, Demonstration, and Handouts Education comprehension: verbalized understanding  HOME EXERCISE PROGRAM: Access Code: [email or web address] URL: [email or web address] Date: 12/21/2023 Prepared by: Tresa Endo  Exercises - Seated Scapular Retraction  - 1 x daily - 7 x weekly - 2 sets - 10 reps - Seated Hamstring Stretch  - 1 x daily - 7 x weekly - 2 reps - 20 sec hold - Clamshell  - 1 x daily - 7 x weekly - 2 sets - 10 reps - Supine Pelvic Tilt  - 1 x daily - 7 x weekly - 2 sets - 10 reps - Supine March  - 1 x daily - 7 x weekly - 2 sets - 10 reps - Seated Figure 4 Piriformis Stretch  - 1 x daily - 7 x weekly - 3 sets - 10 reps - Supine Figure 4 Piriformis Stretch  - 3 x daily - 7 x weekly - 1 sets - 3 reps - 20 hold - Hooklying Single Knee to Chest Stretch  - 3 x daily - 7 x  weekly - 1 sets - 3 reps - 20 hold - Supine Double Knee to Chest  - 3 x daily - 7 x weekly - 1 sets - 3 reps - 20 hold  ASSESSMENT:  CLINICAL IMPRESSION: Patient is a 73 y.o. male who was seen today for physical therapy evaluation and treatment for right hip pain. He was treated at this clinic for this pain and was discharged pending MRI and visit with neurosurgeon. He has stenosis and spondylolisthesis on at L4/5 and MD has recommended return to PT and injections.  He is a candidate for surgery and would like to put this off as long as he can. Pt with limited lumbar extension and Rt sidebending due to reproduction of Rt LE pain.  Pain is 4-8/10 and is limited with standing 5-8 minutes with functional tasks.    Patient presents to skilled PT with increased pain, muscle spasms, muscle weakness, decreased flexibility, and difficulty walking/standing.  Patient would benefit from skilled PT to address his functional impairments to allow him to return to decreased pain with ambulation and to be able to perform yard work again.  OBJECTIVE IMPAIRMENTS: decreased balance, difficulty walking, decreased strength, increased muscle spasms, impaired flexibility, postural dysfunction, and pain.   ACTIVITY LIMITATIONS: lifting, bending, standing, and squatting  PARTICIPATION LIMITATIONS: driving, community activity, and yard work  PERSONAL FACTORS: Past/current experiences, Time since onset of injury/illness/exacerbation, and 3+ comorbidities: RA, IT band syndrome, Hx of cervical and lumbar surgeries  are also affecting patient's functional outcome.   REHAB POTENTIAL: Good  CLINICAL DECISION MAKING: Evolving/moderate complexity  EVALUATION COMPLEXITY: Moderate   GOALS: Goals reviewed with patient? Yes  SHORT TERM GOALS: Target date: 01/18/24 Patient will be independent with initial HEP. Baseline: Goal status: INITIAL  2.  Patient will report at least a 25% improvement in Rt LE and low back pain with  standing  Baseline:  Goal status: INITIAL  3.  Stand for > or = to 5-7 minutes for self-care tasks without limitation  Baseline: 3-5 minutes  Goal status: INITIAL   LONG TERM GOALS: Target date: 02/14/2024      Patient will be independent with advanced HEP to allow for self progression after discharge. Baseline:  Goal status: INITIAL  2.  Modified Oswestry to < or = to 10/50 (20%) for improved function Baseline: 19/50 (38%) Goal status: INITIAL  3.  Improve PSFS to > or = to 7/10 for unloading the dishwasher due to reduced pain Baseline: 5/10 Goal status: INITIAL  4.  Improve PSFS to > or = to 5-6/10 for taking a shower due to reduced pain Baseline: 3-4/10 Goal status: INITIAL  5. Report > or = to 40% reduction in low back and Rt LE symptoms with standing and walking   Baseline: 7-8/10  Goal Status: INITIAL   PLAN:  PT FREQUENCY: 2x/week  PT DURATION: 8 weeks  PLANNED INTERVENTIONS: 97164- PT Re-evaluation, 97110-Therapeutic exercises, 97530- Therapeutic activity, 97112- Neuromuscular re-education, 97535- Self Care, [id number]- Manual therapy, [id number]- Gait training, [phone]- Canalith repositioning, [id number]- Aquatic Therapy, 97014- Electrical stimulation (unattended), [id number]- Electrical stimulation (manual), 97016- Vasopneumatic device, [id number]- Ultrasound, [id number]- Traction (mechanical), Patient/Family education, Balance training, Stair training, Taping, Dry Needling, Joint mobilization, Joint manipulation, Spinal manipulation, Spinal mobilization, Cryotherapy, Moist heat, and Biofeedback  PLAN FOR NEXT SESSION: flexion based exercise, LE strength in sitting or supine/side lying as tolerated, lumbar mechanical traction, dry needling as needed   Lorrene Reid, PT 12/21/23 1:01 PM   [facility] [address] [place], [place] [address] Phone # [phone] Fax [phone]

## 2023-12-21 ENCOUNTER — Ambulatory Visit: Attending: Neurological Surgery

## 2023-12-21 ENCOUNTER — Other Ambulatory Visit: Payer: Self-pay

## 2023-12-21 DIAGNOSIS — M5417 Radiculopathy, lumbosacral region: Secondary | ICD-10-CM | POA: Insufficient documentation

## 2023-12-21 DIAGNOSIS — M4316 Spondylolisthesis, lumbar region: Secondary | ICD-10-CM | POA: Insufficient documentation

## 2023-12-21 DIAGNOSIS — M6281 Muscle weakness (generalized): Secondary | ICD-10-CM | POA: Insufficient documentation

## 2023-12-21 DIAGNOSIS — M25551 Pain in right hip: Secondary | ICD-10-CM | POA: Diagnosis present

## 2023-12-21 DIAGNOSIS — M5459 Other low back pain: Secondary | ICD-10-CM

## 2023-12-21 DIAGNOSIS — R252 Cramp and spasm: Secondary | ICD-10-CM

## 2023-12-29 ENCOUNTER — Ambulatory Visit: Admitting: Physical Therapy

## 2023-12-29 DIAGNOSIS — G5702 Lesion of sciatic nerve, left lower limb: Secondary | ICD-10-CM

## 2023-12-29 DIAGNOSIS — M5459 Other low back pain: Secondary | ICD-10-CM

## 2023-12-29 DIAGNOSIS — R252 Cramp and spasm: Secondary | ICD-10-CM

## 2023-12-29 DIAGNOSIS — M25551 Pain in right hip: Secondary | ICD-10-CM

## 2023-12-29 DIAGNOSIS — M5417 Radiculopathy, lumbosacral region: Secondary | ICD-10-CM

## 2023-12-29 DIAGNOSIS — M6281 Muscle weakness (generalized): Secondary | ICD-10-CM

## 2023-12-29 NOTE — Therapy (Signed)
 OUTPATIENT PHYSICAL THERAPY LUMBAR TREATMENT   Patient Name: Isaiah Price MRN: [id number] DOB:09/19/1951, 73 y.o., male Today's Date: 12/29/2023  END OF SESSION:  PT End of Session - 12/29/23 1229     Visit Number 2    Date for PT Re-Evaluation 02/15/24    Authorization Type Medicare/ BCBC    Progress Note Due on Visit 10    PT Start Time 1230    PT Stop Time 1310    PT Time Calculation (min) 40 min    Activity Tolerance Patient tolerated treatment well    Behavior During Therapy WFL for tasks assessed/performed               Past Medical History:  Diagnosis Date   Achilles tendinitis, left leg    At risk for sleep apnea    STOP--BANG SCORE= 5  (routed to pt's pcp in epic 11-13-2018)   Cervicogenic headache 06/08/2021   Chronic insomnia 12/31/2015   Chronic neck pain    Congenital pectus excavatum    per pt had pulmonary test ,  60% restrictive by states asymptomatic   DDD (degenerative disc disease), cervical    Epicondylitis, lateral, right    Gait abnormality 06/08/2021   GERD (gastroesophageal reflux disease)    History of exercise intolerance    09-02-2017  ETT,  negative for ischemia   History of pericarditis 1985   HTN (hypertension)    Hypercholesteremia 12/31/2015   Iritis    IT band syndrome 05/06/2014   Lower urinary tract symptoms (LUTS)    Neuropathy    bilateral arms with tingling and numbness at times due to cervical neck   OA (osteoarthritis)    Polyarthralgia    Prostate cancer [facility])    urologist-  dr Laverle Patter--- Stage T1c, Gleason 3+4, 06/08/21 recurrent prostate cancer   Rheumatoid arthritis [facility])    rheumatologist-- dr Dierdre Forth--- seropositive,multiple sites   Tendon disorder 2022   torn tendon, right arm   Wears glasses    Past Surgical History:  Procedure Laterality Date   ANTERIOR CERVICAL DECOMP/DISCECTOMY FUSION  1991   dr Newell Coral   C5 -- 7  (per pt fusion failed)   CARPAL TUNNEL RELEASE Right done at same time of CMC fusion    CARPOMETACARPAL (CMC) FUSION OF THUMB Bilateral 2015 and 2014  approx.   CATARACT EXTRACTION W/ INTRAOCULAR LENS  IMPLANT, BILATERAL  2013   LYMPHADENECTOMY Bilateral 11/16/2018   Procedure: LYMPHADENECTOMY, PELVIC;  Surgeon: Heloise Purpura, MD;  Location: [facility];  Service: Urology;  Laterality: Bilateral;   ROBOT ASSISTED LAPAROSCOPIC RADICAL PROSTATECTOMY N/A 11/16/2018   Procedure: XI ROBOTIC ASSISTED LAPAROSCOPIC RADICAL PROSTATECTOMY LEVEL 2;  Surgeon: Heloise Purpura, MD;  Location: [facility];  Service: Urology;  Laterality: N/A;   SHOULDER SURGERY Left age 40   TONSILLECTOMY  child   Patient Active Problem List   Diagnosis Date Noted   Dyslipidemia 10/02/2023   Aortic root enlargement (HCC) 10/02/2023   Elevated coronary artery calcium score 10/02/2023   Sciatic pain, right 09/07/2023   Palpitations 09/27/2021   Cervicogenic headache 06/08/2021   Gait abnormality 06/08/2021   Prostate cancer (HCC) 11/16/2018   Malignant neoplasm of prostate (HCC) 09/18/2018   Daytime somnolence 08/04/2017   Dyspnea on exertion 08/04/2017   Exercise intolerance 08/04/2017   Need for influenza vaccination 07/01/2017   Ulnar neuropathy at elbow, 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   Essential hypertension 01/12/2017   Groin strain 11/18/2016   Irregular bowel habits 11/18/2016   Rheumatoid arthritis (HCC) 01/22/2016  Chronic insomnia 12/31/2015   Chronic pain 12/31/2015   Esophageal reflux 12/31/2015   Hypercholesteremia 12/31/2015   Chronic arthritis 12/31/2015   Wrist pain, right 12/01/2015   IT band syndrome 05/06/2014   Cervical radiculopathy 07/14/2011   Arthritis pain, hand 06/02/2011   Osteoarthritis of CMC joint of thumb 06/02/2011    PCP: Barbie Banner, MD  REFERRING PROVIDER: Ivor Messier, MD  REFERRING DIAG:  Diagnosis  M43.16 (ICD-10-CM) - Spondylolisthesis at L4-L5 level   THERAPY DIAG:  No diagnosis found.  Rationale for Evaluation and Treatment: Rehabilitation  ONSET DATE:  October 2024  SUBJECTIVE:   SUBJECTIVE STATEMENT Pt states he got an injection in his back on Monday. Feels pretty okay. Pt has torn tendons in his arms/elbows that limits too much UE. "I can walk without a limp right now"  PERTINENT HISTORY: RA, IT band syndrome, ACDF of C5-7 in 1991, Lumbar Laminectomy L5-S1 October 2023  PAIN: 12/21/23 Are you having pain? Yes: NPRS scale: 2 or 3/10 Pain location: low back, right buttock and hip Pain description: shooting, sharp, sometimes aching and radiating down Rt LE Aggravating factors: standing and walking Relieving factors: sitting  PRECAUTIONS: None  RED FLAGS: None   WEIGHT BEARING RESTRICTIONS: No  FALLS:  Has patient fallen in last 6 months? No  LIVING ENVIRONMENT: Lives with: lives with their spouse Lives in: House/apartment Stairs:  one story home Has following equipment at home: Grab bars  OCCUPATION: retired, formerly a Designer, television/film set  PLOF: Independent and Leisure: yard work Standing: 3-5 minutes static, moving around 5-8 min   The Patient-Specific Functional Scale  Initial:  I am going to ask you to identify up to 3 important activities that you are unable to do or are having difficulty with as a result of this problem.  Today are there any activities that you are unable to do or having difficulty with because of this?  (Patient shown scale and patient rated each activity)  Follow up: When you first came in you had difficulty performing these activities.  Today do you still have difficulty?  Patient-Specific activity scoring scheme (Point to one number): PSFS  0 1 2 3 4 5 6 7 8 9  10 Unable                                                                                                          Able to perform To perform                                                                                                    activity at the same Activity  Level as before                                                                                                                        Injury or problem  Activity  Unloading dishwasher                                          Initial: 5/10                      follow up:  2.   Standing for  shower                                            Initial:  3-4/10                     follow up:   PATIENT GOALS: To be able to walk/stand without hurting, avoid surgery for now.   NEXT MD VISIT:   OBJECTIVE:  Note: Objective measures were completed at Evaluation unless otherwise noted.  DIAGNOSTIC FINDINGS:  Right Hip Radiograph on 10/06/2023: FINDINGS: There is no evidence of hip fracture or dislocation. No significant arthropathy. No bony lesions identified. The bony pelvis is unremarkable.  Lumbar MRI on 11/23/23:  1. 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 2. Interval left laminectomy at L5-S1 without residual lateral recess stenosis. Unchanged severe left neural foraminal stenosis. 3. 3.2 cm abdominal aortic aneurysm. Recommend surveillance ultrasound in 3 years.  PATIENT SURVEYS:  Eval: Modified Oswestry: 19/50= 38% limitation  COGNITION: Overall cognitive status: Within functional limits for tasks assessed     SENSATION: Reports some numbness and tingling down his right leg, especially with prolonged standing.  MUSCLE LENGTH: Hamstrings: tightness noted bilat  POSTURE: rounded shoulders, forward head, and flexed trunk   PALPATION: Tenderness to palpation Rt gluteals and Lt lumbar spine  LOWER EXTREMITY ROM: Lumbar A/ROM: flexion full without pain, extension to neutral with Rt LE pain, Lt sidebending full without pain, Rt sidebending to neutral with pain  Rt hip flexibility: unable to perform figure 4 on Rt in sitting  LOWER EXTREMITY MMT:  Eval:   Right hip strength grossly 4-/5 Right hamstring strength is 4/5 Right quad strength is 5-/5 Left LE strength is WFL  GAIT: Distance walked: >500  ft Assistive device utilized: None Level of assistance: Complete Independence Comments: Patient with antalgic gait with decreased stance time on right LE  TODAY'S TREATMENT  12/29/23 Nustep L5 x 5 min LEs only Seated hamstring stretch x30" Seated figure 4 stretch 2x30" Supine bridge 2x10 Posterior pelvic tilt x10 Posterior pelvic tilt + marching 2x10 Posterior pelvic tilt knee/hip flexion feet on ball 2x10 Supine posterior pelvic tilt + pball diagonal press downs x10 R&L Sidelying Clamshell yellow TB 2x10 R&L Attempted lateral hip shifts into wall; however, pt reports R LE radiation Standing glute max iso press into wall 2x10 R&L; focus on keeping R hip trendelenburg  DATE: 12/20/2023  Findings from evaluation discussed, pt educated on plan of care, HEP initiated.  Modified HEP to include more flexion based exercise  PATIENT EDUCATION:  Education details: Research scientist (medical) Person educated: Patient Education method: Explanation, Demonstration, and Handouts Education comprehension: verbalized understanding  HOME EXERCISE PROGRAM: Access Code: [email or web address] URL: [email or web address] Date: 12/21/2023 Prepared by: Tresa Endo  Exercises - Seated Scapular Retraction  - 1 x daily - 7 x weekly - 2 sets - 10 reps - Seated Hamstring Stretch  - 1 x daily - 7 x weekly - 2 reps - 20 sec hold - Clamshell  - 1 x daily - 7 x weekly - 2 sets - 10 reps - Supine Pelvic Tilt  - 1 x daily - 7 x weekly - 2 sets - 10 reps - Supine March  - 1 x daily - 7 x weekly - 2 sets - 10 reps - Seated Figure 4 Piriformis Stretch  - 1 x daily - 7 x weekly - 3 sets - 10 reps - Supine Figure 4 Piriformis Stretch  - 3 x daily - 7 x weekly - 1 sets - 3 reps - 20 hold - Hooklying Single Knee to Chest Stretch  - 3 x daily - 7 x weekly - 1 sets - 3 reps - 20 hold - Supine Double Knee to Chest  - 3  x daily - 7 x weekly - 1 sets - 3 reps - 20 hold  ASSESSMENT:  CLINICAL IMPRESSION: Continued to work on improving R hip mobility. Still remains tight with external rotation/figure 4 stretching. Focused on core and hip strengthening today. Able to correct trendelenburg on R given cues while shifting full weight on R LE.   From eval: Patient is a 73 y.o. male who was seen today for physical therapy evaluation and treatment for right hip pain. He was treated at this clinic for this pain and was discharged pending MRI and visit with neurosurgeon. He has stenosis and spondylolisthesis on at L4/5 and MD has recommended return to PT and injections.  He is a candidate for surgery and would like to put this off as long as he can. Pt with limited lumbar extension and Rt sidebending due to reproduction of Rt LE pain.  Pain is 4-8/10 and is limited with standing 5-8 minutes with functional tasks.    Patient presents to skilled PT with increased pain, muscle spasms, muscle weakness, decreased flexibility, and difficulty walking/standing.  Patient would benefit from skilled PT to address his functional impairments to allow him to return to decreased pain with ambulation and to be able to perform yard work again.  OBJECTIVE IMPAIRMENTS: decreased balance, difficulty walking, decreased strength, increased muscle spasms, impaired flexibility, postural dysfunction, and pain.     GOALS: Goals reviewed with patient? Yes  SHORT TERM GOALS: Target date: 01/18/24 Patient will be independent with initial HEP. Baseline: Goal status: INITIAL  2.  Patient will report at least a 25% improvement in Rt LE and low back pain  with standing  Baseline:  Goal status: INITIAL  3.  Stand for > or = to 5-7 minutes for self-care tasks without limitation  Baseline: 3-5 minutes  Goal status: INITIAL   LONG TERM GOALS: Target date: 02/14/2024      Patient will be independent with advanced HEP to allow for self progression  after discharge. Baseline:  Goal status: INITIAL  2.  Modified Oswestry to < or = to 10/50 (20%) for improved function Baseline: 19/50 (38%) Goal status: INITIAL  3.  Improve PSFS to > or = to 7/10 for unloading the dishwasher due to reduced pain Baseline: 5/10 Goal status: INITIAL  4.  Improve PSFS to > or = to 5-6/10 for taking a shower due to reduced pain Baseline: 3-4/10 Goal status: INITIAL  5. Report > or = to 40% reduction in low back and Rt LE symptoms with standing and walking   Baseline: 7-8/10  Goal Status: INITIAL   PLAN:  PT FREQUENCY: 2x/week  PT DURATION: 8 weeks  PLANNED INTERVENTIONS: [id number]- PT Re-evaluation, 97110-Therapeutic exercises, 97530- Therapeutic activity, 97112- Neuromuscular re-education, 97535- Self Care, 96045- Manual therapy, [id number]- Gait training, [phone]- Canalith repositioning, [id number]- Aquatic Therapy, 97014- Electrical stimulation (unattended), [id number]- Electrical stimulation (manual), 97016- Vasopneumatic device, [id number]- Ultrasound, [id number]- Traction (mechanical), Patient/Family education, Balance training, Stair training, Taping, Dry Needling, Joint mobilization, Joint manipulation, Spinal manipulation, Spinal mobilization, Cryotherapy, Moist heat, and Biofeedback  PLAN FOR NEXT SESSION: flexion based exercise, LE strength in sitting or supine/side lying as tolerated, lumbar mechanical traction, dry needling as needed   [facility] April Dell Ponto, PT 12/29/23 12:29 PM   [facility] [address], [address] [place], [place] [address] Phone # [phone] Fax [phone]

## 2024-01-02 ENCOUNTER — Encounter: Payer: Self-pay | Admitting: Physical Therapy

## 2024-01-02 ENCOUNTER — Ambulatory Visit: Admitting: Physical Therapy

## 2024-01-02 DIAGNOSIS — M25551 Pain in right hip: Secondary | ICD-10-CM | POA: Diagnosis not present

## 2024-01-02 DIAGNOSIS — R252 Cramp and spasm: Secondary | ICD-10-CM

## 2024-01-02 DIAGNOSIS — M5417 Radiculopathy, lumbosacral region: Secondary | ICD-10-CM

## 2024-01-02 DIAGNOSIS — G5702 Lesion of sciatic nerve, left lower limb: Secondary | ICD-10-CM

## 2024-01-02 DIAGNOSIS — M6281 Muscle weakness (generalized): Secondary | ICD-10-CM

## 2024-01-02 DIAGNOSIS — M5459 Other low back pain: Secondary | ICD-10-CM

## 2024-01-02 NOTE — Therapy (Signed)
 OUTPATIENT PHYSICAL THERAPY LUMBAR TREATMENT   Patient Name: Isaiah Price MRN: [id number] DOB:1951-07-29, 73 y.o., male Today's Date: 01/02/2024  END OF SESSION:  PT End of Session - 01/02/24 1012     Visit Number 3    Date for PT Re-Evaluation 02/15/24    Authorization Type Medicare/ BCBC    Progress Note Due on Visit 10    PT Start Time 1012    PT Stop Time 1059    PT Time Calculation (min) 47 min    Activity Tolerance Patient tolerated treatment well    Behavior During Therapy WFL for tasks assessed/performed                Past Medical History:  Diagnosis Date   Achilles tendinitis, left leg    At risk for sleep apnea    STOP--BANG SCORE= 5  (routed to pt's pcp in epic 11-13-2018)   Cervicogenic headache 06/08/2021   Chronic insomnia 12/31/2015   Chronic neck pain    Congenital pectus excavatum    per pt had pulmonary test ,  60% restrictive by states asymptomatic   DDD (degenerative disc disease), cervical    Epicondylitis, lateral, right    Gait abnormality 06/08/2021   GERD (gastroesophageal reflux disease)    History of exercise intolerance    09-02-2017  ETT,  negative for ischemia   History of pericarditis 1985   HTN (hypertension)    Hypercholesteremia 12/31/2015   Iritis    IT band syndrome 05/06/2014   Lower urinary tract symptoms (LUTS)    Neuropathy    bilateral arms with tingling and numbness at times due to cervical neck   OA (osteoarthritis)    Polyarthralgia    Prostate cancer [facility])    urologist-  dr Laverle Patter--- Stage T1c, Gleason 3+4, 06/08/21 recurrent prostate cancer   Rheumatoid arthritis [facility])    rheumatologist-- dr Dierdre Forth--- seropositive,multiple sites   Tendon disorder 2022   torn tendon, right arm   Wears glasses    Past Surgical History:  Procedure Laterality Date   ANTERIOR CERVICAL DECOMP/DISCECTOMY FUSION  1991   dr Newell Coral   C5 -- 7  (per pt fusion failed)   CARPAL TUNNEL RELEASE Right done at same time of CMC fusion    CARPOMETACARPAL (CMC) FUSION OF THUMB Bilateral 2015 and 2014  approx.   CATARACT EXTRACTION W/ INTRAOCULAR LENS  IMPLANT, BILATERAL  2013   LYMPHADENECTOMY Bilateral 11/16/2018   Procedure: LYMPHADENECTOMY, PELVIC;  Surgeon: Heloise Purpura, MD;  Location: [facility];  Service: Urology;  Laterality: Bilateral;   ROBOT ASSISTED LAPAROSCOPIC RADICAL PROSTATECTOMY N/A 11/16/2018   Procedure: XI ROBOTIC ASSISTED LAPAROSCOPIC RADICAL PROSTATECTOMY LEVEL 2;  Surgeon: Heloise Purpura, MD;  Location: [facility];  Service: Urology;  Laterality: N/A;   SHOULDER SURGERY Left age 35   TONSILLECTOMY  child   Patient Active Problem List   Diagnosis Date Noted   Dyslipidemia 10/02/2023   Aortic root enlargement (HCC) 10/02/2023   Elevated coronary artery calcium score 10/02/2023   Sciatic pain, right 09/07/2023   Palpitations 09/27/2021   Cervicogenic headache 06/08/2021   Gait abnormality 06/08/2021   Prostate cancer (HCC) 11/16/2018   Malignant neoplasm of prostate (HCC) 09/18/2018   Daytime somnolence 08/04/2017   Dyspnea on exertion 08/04/2017   Exercise intolerance 08/04/2017   Need for influenza vaccination 07/01/2017   Ulnar neuropathy at elbow, 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   Essential hypertension 01/12/2017   Groin strain 11/18/2016   Irregular bowel habits 11/18/2016   Rheumatoid arthritis (HCC)  01/22/2016   Chronic insomnia 12/31/2015   Chronic pain 12/31/2015   Esophageal reflux 12/31/2015   Hypercholesteremia 12/31/2015   Chronic arthritis 12/31/2015   Wrist pain, right 12/01/2015   IT band syndrome 05/06/2014   Cervical radiculopathy 07/14/2011   Arthritis pain, hand 06/02/2011   Osteoarthritis of CMC joint of thumb 06/02/2011    PCP: Barbie Banner, MD  REFERRING PROVIDER: Ivor Messier, MD  REFERRING DIAG:  Diagnosis  M43.16 (ICD-10-CM) - Spondylolisthesis at L4-L5 level   THERAPY DIAG:  Muscle weakness (generalized)  Pain in right hip  Radiculopathy, lumbosacral  region  Cramp and spasm  Other low back pain  Piriformis syndrome, left  Rationale for Evaluation and Treatment: Rehabilitation  ONSET DATE: October 2024  SUBJECTIVE:   SUBJECTIVE STATEMENT It seems to be getting better. Able to walk some without pain. No pain right now. Had pain with one exercise last visit and it lasted for a couple of days. Better this morning.  PERTINENT HISTORY: RA, IT band syndrome, ACDF of C5-7 in 1991, Lumbar Laminectomy L5-S1 October 2023  PAIN: 12/21/23 Are you having pain? Yes: NPRS scale: 0/10 Pain location: low back, right buttock and hip Pain description: shooting, sharp, sometimes aching and radiating down Rt LE Aggravating factors: standing and walking Relieving factors: sitting  PRECAUTIONS: None  RED FLAGS: None   WEIGHT BEARING RESTRICTIONS: No  FALLS:  Has patient fallen in last 6 months? No  LIVING ENVIRONMENT: Lives with: lives with their spouse Lives in: House/apartment Stairs:  one story home Has following equipment at home: Grab bars  OCCUPATION: retired, formerly a Designer, television/film set  PLOF: Independent and Leisure: yard work Standing: 3-5 minutes static, moving around 5-8 min   The Patient-Specific Functional Scale  Initial:  I am going to ask you to identify up to 3 important activities that you are unable to do or are having difficulty with as a result of this problem.  Today are there any activities that you are unable to do or having difficulty with because of this?  (Patient shown scale and patient rated each activity)  Follow up: When you first came in you had difficulty performing these activities.  Today do you still have difficulty?  Patient-Specific activity scoring scheme (Point to one number): PSFS  0 1 2 3 4 5 6 7 8 9  10 Unable                                                                                                          Able to perform To perform  activity at the same Activity         Level as before                                                                                                                       Injury or problem  Activity  Unloading dishwasher                                          Initial: 5/10                      follow up:  2.   Standing for  shower                                            Initial:  3-4/10                     follow up:   PATIENT GOALS: To be able to walk/stand without hurting, avoid surgery for now.   NEXT MD VISIT:   OBJECTIVE:  Note: Objective measures were completed at Evaluation unless otherwise noted.  DIAGNOSTIC FINDINGS:  Right Hip Radiograph on 10/06/2023: FINDINGS: There is no evidence of hip fracture or dislocation. No significant arthropathy. No bony lesions identified. The bony pelvis is unremarkable.  Lumbar MRI on 11/23/23:  1. 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 2. Interval left laminectomy at L5-S1 without residual lateral recess stenosis. Unchanged severe left neural foraminal stenosis. 3. 3.2 cm abdominal aortic aneurysm. Recommend surveillance ultrasound in 3 years.  PATIENT SURVEYS:  Eval: Modified Oswestry: 19/50= 38% limitation  COGNITION: Overall cognitive status: Within functional limits for tasks assessed     SENSATION: Reports some numbness and tingling down his right leg, especially with prolonged standing.  MUSCLE LENGTH: Hamstrings: tightness noted bilat  POSTURE: rounded shoulders, forward head, and flexed trunk   PALPATION: Tenderness to palpation Rt gluteals and Lt lumbar spine  LOWER EXTREMITY ROM: Lumbar A/ROM: flexion full without pain, extension to neutral with Rt LE pain, Lt sidebending full without pain, Rt sidebending to neutral with pain  Rt hip flexibility: unable to perform figure 4 on Rt in sitting  LOWER EXTREMITY MMT:  Eval:   Right hip  strength grossly 4-/5 Right hamstring strength is 4/5 Right quad strength is 5-/5 Left LE strength is WFL  GAIT: Distance walked: >500 ft Assistive device utilized: None Level of assistance: Complete Independence Comments: Patient with antalgic gait with decreased stance time on right LE  TODAY'S TREATMENT  01/02/24 Nustep L5 x 5 min LEs only Seated hamstring stretch leg on mat table 2x30" B - less strain on back Seated figure 4 stretch 2x30" Posterior pelvic tilt + marching 2x10 Sequential march x 10 B 90/90 alt foot taps 2 x 5 B Oblique crunches x 10 B Sidelying Clamshell yellow loop 2x10 R&L Standing glute max iso press into wall 10 sec hold 2x10 R&L; focus on keeping R hip trendelenburg IASTM with ball to R gluteals and low back Standing rows Blue with TA contraction x 10 Pallof press blue band 5 sec isometric hold x 5 B Seated roll out on blue ball 3 way.  12/29/23 Nustep L5 x 5 min LEs only Seated hamstring stretch x30" Seated figure 4 stretch 2x30" Supine bridge 2x10 Posterior pelvic tilt x10 Posterior pelvic tilt + marching 2x10 Posterior pelvic tilt knee/hip flexion feet on ball 2x10 Supine posterior pelvic tilt + pball diagonal press downs x10 R&L Sidelying Clamshell yellow TB 2x10 R&L Attempted lateral hip shifts into wall; however, pt reports R LE radiation Standing glute max iso press into wall 2x10 R&L; focus on keeping R hip trendelenburg  DATE: 12/20/2023  Findings from evaluation discussed, pt educated on plan of care, HEP initiated.  Modified HEP to include more flexion based exercise  PATIENT EDUCATION:  Education details: Research scientist (medical) Person educated: Patient Education method: Explanation, Demonstration, and Handouts Education comprehension: verbalized understanding  HOME EXERCISE PROGRAM: Access Code: [email or web address] URL:  [email or web address] Date: 12/21/2023 Prepared by: Tresa Endo  Exercises - Seated Scapular Retraction  - 1 x daily - 7 x weekly - 2 sets - 10 reps - Seated Hamstring Stretch  - 1 x daily - 7 x weekly - 2 reps - 20 sec hold - Clamshell  - 1 x daily - 7 x weekly - 2 sets - 10 reps - Supine Pelvic Tilt  - 1 x daily - 7 x weekly - 2 sets - 10 reps - Supine March  - 1 x daily - 7 x weekly - 2 sets - 10 reps - Seated Figure 4 Piriformis Stretch  - 1 x daily - 7 x weekly - 3 sets - 10 reps - Supine Figure 4 Piriformis Stretch  - 3 x daily - 7 x weekly - 1 sets - 3 reps - 20 hold - Hooklying Single Knee to Chest Stretch  - 3 x daily - 7 x weekly - 1 sets - 3 reps - 20 hold - Supine Double Knee to Chest  - 3 x daily - 7 x weekly - 1 sets - 3 reps - 20 hold  ASSESSMENT:  CLINICAL IMPRESSION: [name] presents with no pain today and less pain overall since injection. He was able to progress his core strengthening today. The Pallof press was a little irritating at the end to the low back. Advised flexion stretches if tight later on. He demonstrates good TA control with sequential march and 90/90 toe taps.   From eval: Patient is a 73 y.o. male who was seen today for physical therapy evaluation and treatment for right hip pain. He was treated at this clinic for this pain and was discharged pending MRI and visit with neurosurgeon. He has stenosis and spondylolisthesis on at L4/5 and MD has recommended return to PT and injections.  He is a candidate for surgery and would like to put this off as long as he can. Pt with limited lumbar extension and Rt sidebending due to reproduction of Rt LE pain.  Pain is 4-8/10 and is limited with standing 5-8 minutes with functional tasks.    Patient presents to skilled PT with increased pain, muscle spasms, muscle weakness, decreased flexibility, and difficulty walking/standing.  Patient would benefit from skilled PT to address his functional impairments to allow him to  return to decreased pain with ambulation and to be able to perform yard work again.  OBJECTIVE IMPAIRMENTS: decreased balance, difficulty walking, decreased strength, increased muscle spasms, impaired flexibility, postural dysfunction, and pain.     GOALS: Goals reviewed with patient? Yes  SHORT TERM GOALS: Target date: 01/18/24 Patient will be independent with initial HEP. Baseline: Goal status: INITIAL  2.  Patient will report at least a 25% improvement in Rt LE and low back pain with standing  Baseline:  Goal status: INITIAL  3.  Stand for > or = to 5-7 minutes for self-care tasks without limitation  Baseline: 3-5 minutes  Goal status: INITIAL   LONG TERM GOALS: Target date: 02/14/2024      Patient will be independent with advanced HEP to allow for self progression after discharge. Baseline:  Goal status: INITIAL  2.  Modified Oswestry to < or = to 10/50 (20%) for improved function Baseline: 19/50 (38%) Goal status: INITIAL  3.  Improve PSFS to > or = to 7/10 for unloading the dishwasher due to reduced pain Baseline: 5/10 Goal status: INITIAL  4.  Improve PSFS to > or = to 5-6/10 for taking a shower due to reduced pain Baseline: 3-4/10 Goal status: INITIAL  5. Report > or = to 40% reduction in low back and Rt LE symptoms with standing and walking   Baseline: 7-8/10  Goal Status: INITIAL   PLAN:  PT FREQUENCY: 2x/week  PT DURATION: 8 weeks  PLANNED INTERVENTIONS: 97164- PT Re-evaluation, 97110-Therapeutic exercises, 97530- Therapeutic activity, 97112- Neuromuscular re-education, 97535- Self Care, [id number]- Manual therapy, [id number]- Gait training, [phone]- Canalith repositioning, [id number]- Aquatic Therapy, 97014- Electrical stimulation (unattended), [id number]- Electrical stimulation (manual), 97016- Vasopneumatic device, [id number]- Ultrasound, [id number]- Traction (mechanical), Patient/Family education, Balance training, Stair training, Taping, Dry Needling, Joint mobilization, Joint  manipulation, Spinal manipulation, Spinal mobilization, Cryotherapy, Moist heat, and Biofeedback  PLAN FOR NEXT SESSION: flexion based exercise, LE strength in sitting or supine/side lying as tolerated, lumbar mechanical traction, dry needling as needed   Solon Palm, PT  01/02/24 5:44 PM   [facility] [address] [place], [place] [address] Phone # [phone] Fax [phone]

## 2024-01-05 ENCOUNTER — Encounter: Payer: Self-pay | Admitting: Rehabilitative and Restorative Service Providers"

## 2024-01-05 ENCOUNTER — Ambulatory Visit: Admitting: Rehabilitative and Restorative Service Providers"

## 2024-01-05 DIAGNOSIS — M5459 Other low back pain: Secondary | ICD-10-CM

## 2024-01-05 DIAGNOSIS — M25551 Pain in right hip: Secondary | ICD-10-CM | POA: Diagnosis not present

## 2024-01-05 DIAGNOSIS — M5417 Radiculopathy, lumbosacral region: Secondary | ICD-10-CM

## 2024-01-05 DIAGNOSIS — R252 Cramp and spasm: Secondary | ICD-10-CM

## 2024-01-05 DIAGNOSIS — M6281 Muscle weakness (generalized): Secondary | ICD-10-CM

## 2024-01-05 NOTE — Therapy (Signed)
 OUTPATIENT PHYSICAL THERAPY LUMBAR TREATMENT   Patient Name: Isaiah Price MRN: [id number] DOB:April 21, 1951, 73 y.o., male Today's Date: 01/05/2024  END OF SESSION:  PT End of Session - 01/05/24 1145     Visit Number 4    Date for PT Re-Evaluation 02/15/24    Authorization Type Medicare/ BCBC    Progress Note Due on Visit 10    PT Start Time 1142    PT Stop Time 1220    PT Time Calculation (min) 38 min    Activity Tolerance Patient tolerated treatment well    Behavior During Therapy WFL for tasks assessed/performed                Past Medical History:  Diagnosis Date   Achilles tendinitis, left leg    At risk for sleep apnea    STOP--BANG SCORE= 5  (routed to pt's pcp in epic 11-13-2018)   Cervicogenic headache 06/08/2021   Chronic insomnia 12/31/2015   Chronic neck pain    Congenital pectus excavatum    per pt had pulmonary test ,  60% restrictive by states asymptomatic   DDD (degenerative disc disease), cervical    Epicondylitis, lateral, right    Gait abnormality 06/08/2021   GERD (gastroesophageal reflux disease)    History of exercise intolerance    09-02-2017  ETT,  negative for ischemia   History of pericarditis 1985   HTN (hypertension)    Hypercholesteremia 12/31/2015   Iritis    IT band syndrome 05/06/2014   Lower urinary tract symptoms (LUTS)    Neuropathy    bilateral arms with tingling and numbness at times due to cervical neck   OA (osteoarthritis)    Polyarthralgia    Prostate cancer [facility])    urologist-  dr Laverle Patter--- Stage T1c, Gleason 3+4, 06/08/21 recurrent prostate cancer   Rheumatoid arthritis [facility])    rheumatologist-- dr Dierdre Forth--- seropositive,multiple sites   Tendon disorder 2022   torn tendon, right arm   Wears glasses    Past Surgical History:  Procedure Laterality Date   ANTERIOR CERVICAL DECOMP/DISCECTOMY FUSION  1991   dr Newell Coral   C5 -- 7  (per pt fusion failed)   CARPAL TUNNEL RELEASE Right done at same time of CMC fusion    CARPOMETACARPAL (CMC) FUSION OF THUMB Bilateral 2015 and 2014  approx.   CATARACT EXTRACTION W/ INTRAOCULAR LENS  IMPLANT, BILATERAL  2013   LYMPHADENECTOMY Bilateral 11/16/2018   Procedure: LYMPHADENECTOMY, PELVIC;  Surgeon: Heloise Purpura, MD;  Location: [facility];  Service: Urology;  Laterality: Bilateral;   ROBOT ASSISTED LAPAROSCOPIC RADICAL PROSTATECTOMY N/A 11/16/2018   Procedure: XI ROBOTIC ASSISTED LAPAROSCOPIC RADICAL PROSTATECTOMY LEVEL 2;  Surgeon: Heloise Purpura, MD;  Location: [facility];  Service: Urology;  Laterality: N/A;   SHOULDER SURGERY Left age 73   TONSILLECTOMY  child   Patient Active Problem List   Diagnosis Date Noted   Dyslipidemia 10/02/2023   Aortic root enlargement (HCC) 10/02/2023   Elevated coronary artery calcium score 10/02/2023   Sciatic pain, right 09/07/2023   Palpitations 09/27/2021   Cervicogenic headache 06/08/2021   Gait abnormality 06/08/2021   Prostate cancer (HCC) 11/16/2018   Malignant neoplasm of prostate (HCC) 09/18/2018   Daytime somnolence 08/04/2017   Dyspnea on exertion 08/04/2017   Exercise intolerance 08/04/2017   Need for influenza vaccination 07/01/2017   Ulnar neuropathy at elbow, 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   Essential hypertension 01/12/2017   Groin strain 11/18/2016   Irregular bowel habits 11/18/2016   Rheumatoid arthritis (HCC)  01/22/2016   Chronic insomnia 12/31/2015   Chronic pain 12/31/2015   Esophageal reflux 12/31/2015   Hypercholesteremia 12/31/2015   Chronic arthritis 12/31/2015   Wrist pain, right 12/01/2015   IT band syndrome 05/06/2014   Cervical radiculopathy 07/14/2011   Arthritis pain, hand 06/02/2011   Osteoarthritis of CMC joint of thumb 06/02/2011    PCP: Barbie Banner, MD  REFERRING PROVIDER: Ivor Messier, MD  REFERRING DIAG:  Diagnosis  M43.16 (ICD-10-CM) - Spondylolisthesis at L4-L5 level   THERAPY DIAG:  Muscle weakness (generalized)  Pain in right hip  Radiculopathy, lumbosacral  region  Cramp and spasm  Other low back pain  Rationale for Evaluation and Treatment: Rehabilitation  ONSET DATE: October 2024  SUBJECTIVE:   SUBJECTIVE STATEMENT Patient continues to report that overall, he is feeling much better.  PERTINENT HISTORY: RA, IT band syndrome, ACDF of C5-7 in 1991, Lumbar Laminectomy L5-S1 October 2023  PAIN: 12/21/23 Are you having pain? Yes: NPRS scale: 0/10 Pain location: low back, right buttock and hip Pain description: shooting, sharp, sometimes aching and radiating down Rt LE Aggravating factors: standing and walking Relieving factors: sitting  PRECAUTIONS: None  RED FLAGS: None   WEIGHT BEARING RESTRICTIONS: No  FALLS:  Has patient fallen in last 6 months? No  LIVING ENVIRONMENT: Lives with: lives with their spouse Lives in: House/apartment Stairs:  one story home Has following equipment at home: Grab bars  OCCUPATION: retired, formerly a Designer, television/film set  PLOF: Independent and Leisure: yard work Standing: 3-5 minutes static, moving around 5-8 min   The Patient-Specific Functional Scale  Initial:  I am going to ask you to identify up to 3 important activities that you are unable to do or are having difficulty with as a result of this problem.  Today are there any activities that you are unable to do or having difficulty with because of this?  (Patient shown scale and patient rated each activity)  Follow up: When you first came in you had difficulty performing these activities.  Today do you still have difficulty?  Patient-Specific activity scoring scheme (Point to one number): PSFS  0 1 2 3 4 5 6 7 8 9  10 Unable                                                                                                          Able to perform To perform                                                                                                    activity at the same Activity         Level  as before                                                                                                                        Injury or problem  Activity  Unloading dishwasher                                          Initial: 5/10                      follow up:  2.   Standing for  shower                                            Initial:  3-4/10                     follow up:   PATIENT GOALS: To be able to walk/stand without hurting, avoid surgery for now.   NEXT MD VISIT:   OBJECTIVE:  Note: Objective measures were completed at Evaluation unless otherwise noted.  DIAGNOSTIC FINDINGS:  Right Hip Radiograph on 10/06/2023: FINDINGS: There is no evidence of hip fracture or dislocation. No significant arthropathy. No bony lesions identified. The bony pelvis is unremarkable.  Lumbar MRI on 11/23/23:  1. 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 2. Interval left laminectomy at L5-S1 without residual lateral recess stenosis. Unchanged severe left neural foraminal stenosis. 3. 3.2 cm abdominal aortic aneurysm. Recommend surveillance ultrasound in 3 years.  PATIENT SURVEYS:  Eval: Modified Oswestry: 19/50= 38% limitation  COGNITION: Overall cognitive status: Within functional limits for tasks assessed     SENSATION: Reports some numbness and tingling down his right leg, especially with prolonged standing.  MUSCLE LENGTH: Hamstrings: tightness noted bilat  POSTURE: rounded shoulders, forward head, and flexed trunk   PALPATION: Tenderness to palpation Rt gluteals and Lt lumbar spine  LOWER EXTREMITY ROM: Lumbar A/ROM: flexion full without pain, extension to neutral with Rt LE pain, Lt sidebending full without pain, Rt sidebending to neutral with pain  Rt hip flexibility: unable to perform figure 4 on Rt in sitting  LOWER EXTREMITY MMT:  Eval:   Right hip strength grossly 4-/5 Right hamstring strength is 4/5 Right quad strength is 5-/5 Left LE strength is WFL   FUNCTIONAL  ASSESSMENTS:  01/05/2024: 6 minute walk test:  929 ft with pain of 6/10 (patient requires standing recovery periods)  GAIT: Distance walked: >500 ft Assistive device utilized: None Level of assistance: Complete Independence Comments: Patient with antalgic gait with decreased stance time on right LE  TODAY'S TREATMENT  01/05/2024: Nustep level 5 (LE only) x5 min with PT present to discuss status Seated hamstring stretch 2x20 sec bilat 6 minute walk test:  929 ft with pain of 6/10 (patient requires standing recovery periods) Seated piriformis stretch 2x20 sec bilat Seated 3 way blue pball rollout 5x10 sec Standing shoulder rows with blue tband 2x10 Standing shoulder extension with blue tband 2x10 Standing paloff press with blue tband x10 bilat Supine marching with transversus abdominus contraction 2x10 Sidelying clamshell with yellow loop 2x10 bilat   01/02/24 Nustep L5 x 5 min LEs only Seated hamstring stretch leg on mat table 2x30" B - less strain on back Seated figure 4 stretch 2x30" Posterior pelvic tilt + marching 2x10 Sequential march x 10 B 90/90 alt foot taps 2 x 5 B Oblique crunches x 10 B Sidelying Clamshell yellow loop 2x10 R&L Standing glute max iso press into wall 10 sec hold 2x10 R&L; focus on keeping R hip trendelenburg IASTM with ball to R gluteals and low back Standing rows Blue with TA contraction x 10 Pallof press blue band 5 sec isometric hold x 5 B Seated roll out on blue ball 3 way.  12/29/23 Nustep L5 x 5 min LEs only Seated hamstring stretch x30" Seated figure 4 stretch 2x30" Supine bridge 2x10 Posterior pelvic tilt x10 Posterior pelvic tilt + marching 2x10 Posterior pelvic tilt knee/hip flexion feet on ball 2x10 Supine posterior pelvic tilt + pball diagonal press downs x10 R&L Sidelying Clamshell yellow TB 2x10  R&L Attempted lateral hip shifts into wall; however, pt reports R LE radiation Standing glute max iso press into wall 2x10 R&L; focus on keeping R hip trendelenburg   PATIENT EDUCATION:  Education details: [email or web address] Person educated: Patient Education method: Explanation, Demonstration, and Handouts Education comprehension: verbalized understanding  HOME EXERCISE PROGRAM: Access Code: [email or web address] URL: [email or web address] Date: 12/21/2023 Prepared by: Tresa Endo  Exercises - Seated Scapular Retraction  - 1 x daily - 7 x weekly - 2 sets - 10 reps - Seated Hamstring Stretch  - 1 x daily - 7 x weekly - 2 reps - 20 sec hold - Clamshell  - 1 x daily - 7 x weekly - 2 sets - 10 reps - Supine Pelvic Tilt  - 1 x daily - 7 x weekly - 2 sets - 10 reps - Supine March  - 1 x daily - 7 x weekly - 2 sets - 10 reps - Seated Figure 4 Piriformis Stretch  - 1 x daily - 7 x weekly - 3 sets - 10 reps - Supine Figure 4 Piriformis Stretch  - 3 x daily - 7 x weekly - 1 sets - 3 reps - 20 hold - Hooklying Single Knee to Chest Stretch  - 3 x daily - 7 x weekly - 1 sets - 3 reps - 20 hold - Supine Double Knee to Chest  - 3 x daily - 7 x weekly - 1 sets - 3 reps - 20 hold  ASSESSMENT:  CLINICAL IMPRESSION: Mr Mcquarrie presents to skilled PT reporting that he is feeling at least 80% better, on average, since starting PT.  Patient able to participate in a 6 minute walk during PT session with patient starting to have increased pain after 3 minutes.  Patient with increased pain during 6 minute ambulation.  Patient continues to progress with core stability and continues to have preference for flexion secondary to stenosis.  Patient continues to require skilled PT to progress towards goal related activities.  OBJECTIVE IMPAIRMENTS: decreased balance, difficulty walking, decreased strength, increased muscle spasms, impaired flexibility, postural dysfunction, and pain.     GOALS: Goals reviewed with patient?  Yes  SHORT TERM GOALS: Target date: 01/18/24 Patient will be independent with initial HEP. Baseline: Goal status: Met on 01/05/24  2.  Patient will report at least a 25% improvement in Rt LE and low back pain with standing  Baseline:  Goal status: Met on 01/05/24  3.  Stand for > or = to 5-7 minutes for self-care tasks without limitation  Baseline: 3-5 minutes  Goal status: Ongoing   LONG TERM GOALS: Target date: 02/14/2024      Patient will be independent with advanced HEP to allow for self progression after discharge. Baseline:  Goal status: Ongoing  2.  Modified Oswestry to < or = to 10/50 (20%) for improved function Baseline: 19/50 (38%) Goal status: INITIAL  3.  Improve PSFS to > or = to 7/10 for unloading the dishwasher due to reduced pain Baseline: 5/10 Goal status: INITIAL  4.  Improve PSFS to > or = to 5-6/10 for taking a shower due to reduced pain Baseline: 3-4/10 Goal status: INITIAL  5. Report > or = to 40% reduction in low back and Rt LE symptoms with standing and walking   Baseline: 7-8/10  Goal Status: INITIAL   PLAN:  PT FREQUENCY: 2x/week  PT DURATION: 8 weeks  PLANNED INTERVENTIONS: 97164- PT Re-evaluation, 97110-Therapeutic exercises, 97530- Therapeutic activity, 97112- Neuromuscular re-education, 97535- Self Care, 16109- Manual therapy, [id number]- Gait training, [phone]- Canalith repositioning, [id number]- Aquatic Therapy, 97014- Electrical stimulation (unattended), [id number]- Electrical stimulation (manual), 97016- Vasopneumatic device, [id number]- Ultrasound, [id number]- Traction (mechanical), Patient/Family education, Balance training, Stair training, Taping, Dry Needling, Joint mobilization, Joint manipulation, Spinal manipulation, Spinal mobilization, Cryotherapy, Moist heat, and Biofeedback  PLAN FOR NEXT SESSION: flexion based exercise, LE strength in sitting or supine/side lying as tolerated, lumbar mechanical traction, dry needling as needed   Reather Laurence, PT,  DPT 01/05/24, 1:27 PM   [facility] [address] [place], [place] [address] Phone # [phone] Fax [phone]

## 2024-01-08 NOTE — Therapy (Signed)
 OUTPATIENT PHYSICAL THERAPY LUMBAR TREATMENT   Patient Name: Isaiah Price MRN: [id number] DOB:03-22-1951, 73 y.o., male Today's Date: 01/09/2024  END OF SESSION:  PT End of Session - 01/09/24 1014     Visit Number 5    Date for PT Re-Evaluation 02/15/24    Authorization Type Medicare/ BCBC    Progress Note Due on Visit 10    PT Start Time 1014    PT Stop Time 1100    PT Time Calculation (min) 46 min    Activity Tolerance Patient tolerated treatment well    Behavior During Therapy WFL for tasks assessed/performed                 Past Medical History:  Diagnosis Date   Achilles tendinitis, left leg    At risk for sleep apnea    STOP--BANG SCORE= 5  (routed to pt's pcp in epic 11-13-2018)   Cervicogenic headache 06/08/2021   Chronic insomnia 12/31/2015   Chronic neck pain    Congenital pectus excavatum    per pt had pulmonary test ,  60% restrictive by states asymptomatic   DDD (degenerative disc disease), cervical    Epicondylitis, lateral, right    Gait abnormality 06/08/2021   GERD (gastroesophageal reflux disease)    History of exercise intolerance    09-02-2017  ETT,  negative for ischemia   History of pericarditis 1985   HTN (hypertension)    Hypercholesteremia 12/31/2015   Iritis    IT band syndrome 05/06/2014   Lower urinary tract symptoms (LUTS)    Neuropathy    bilateral arms with tingling and numbness at times due to cervical neck   OA (osteoarthritis)    Polyarthralgia    Prostate cancer [facility])    urologist-  dr Laverle Patter--- Stage T1c, Gleason 3+4, 06/08/21 recurrent prostate cancer   Rheumatoid arthritis [facility])    rheumatologist-- dr Dierdre Forth--- seropositive,multiple sites   Tendon disorder 2022   torn tendon, right arm   Wears glasses    Past Surgical History:  Procedure Laterality Date   ANTERIOR CERVICAL DECOMP/DISCECTOMY FUSION  1991   dr Newell Coral   C5 -- 7  (per pt fusion failed)   CARPAL TUNNEL RELEASE Right done at same time of CMC fusion    CARPOMETACARPAL (CMC) FUSION OF THUMB Bilateral 2015 and 2014  approx.   CATARACT EXTRACTION W/ INTRAOCULAR LENS  IMPLANT, BILATERAL  2013   LYMPHADENECTOMY Bilateral 11/16/2018   Procedure: LYMPHADENECTOMY, PELVIC;  Surgeon: Heloise Purpura, MD;  Location: [facility];  Service: Urology;  Laterality: Bilateral;   ROBOT ASSISTED LAPAROSCOPIC RADICAL PROSTATECTOMY N/A 11/16/2018   Procedure: XI ROBOTIC ASSISTED LAPAROSCOPIC RADICAL PROSTATECTOMY LEVEL 2;  Surgeon: Heloise Purpura, MD;  Location: [facility];  Service: Urology;  Laterality: N/A;   SHOULDER SURGERY Left age 76   TONSILLECTOMY  child   Patient Active Problem List   Diagnosis Date Noted   Dyslipidemia 10/02/2023   Aortic root enlargement (HCC) 10/02/2023   Elevated coronary artery calcium score 10/02/2023   Sciatic pain, right 09/07/2023   Palpitations 09/27/2021   Cervicogenic headache 06/08/2021   Gait abnormality 06/08/2021   Prostate cancer (HCC) 11/16/2018   Malignant neoplasm of prostate (HCC) 09/18/2018   Daytime somnolence 08/04/2017   Dyspnea on exertion 08/04/2017   Exercise intolerance 08/04/2017   Need for influenza vaccination 07/01/2017   Ulnar neuropathy at elbow, 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   Essential hypertension 01/12/2017   Groin strain 11/18/2016   Irregular bowel habits 11/18/2016   Rheumatoid arthritis (  HCC) 01/22/2016   Chronic insomnia 12/31/2015   Chronic pain 12/31/2015   Esophageal reflux 12/31/2015   Hypercholesteremia 12/31/2015   Chronic arthritis 12/31/2015   Wrist pain, right 12/01/2015   IT band syndrome 05/06/2014   Cervical radiculopathy 07/14/2011   Arthritis pain, hand 06/02/2011   Osteoarthritis of CMC joint of thumb 06/02/2011    PCP: Barbie Banner, MD  REFERRING PROVIDER: Ivor Messier, MD  REFERRING DIAG:  Diagnosis  M43.16 (ICD-10-CM) - Spondylolisthesis at L4-L5 level   THERAPY DIAG:  Muscle weakness (generalized)  Pain in right hip  Radiculopathy, lumbosacral  region  Cramp and spasm  Other low back pain  Rationale for Evaluation and Treatment: Rehabilitation  ONSET DATE: October 2024  SUBJECTIVE:   SUBJECTIVE STATEMENT Two nights with major leg cramps Thursday and Saturday. Had done squats Saturday.   PERTINENT HISTORY: RA, IT band syndrome, ACDF of C5-7 in 1991, Lumbar Laminectomy L5-S1 October 2023  PAIN: 12/21/23 Are you having pain? Yes: NPRS scale: 3/10 Pain location: low back Pain description: shooting, sharp, sometimes aching and radiating down Rt LE Aggravating factors: standing and walking Relieving factors: sitting  PRECAUTIONS: None  RED FLAGS: None   WEIGHT BEARING RESTRICTIONS: No  FALLS:  Has patient fallen in last 6 months? No  LIVING ENVIRONMENT: Lives with: lives with their spouse Lives in: House/apartment Stairs:  one story home Has following equipment at home: Grab bars  OCCUPATION: retired, formerly a Designer, television/film set  PLOF: Independent and Leisure: yard work Standing: 3-5 minutes static, moving around 5-8 min   The Patient-Specific Functional Scale  Initial:  I am going to ask you to identify up to 3 important activities that you are unable to do or are having difficulty with as a result of this problem.  Today are there any activities that you are unable to do or having difficulty with because of this?  (Patient shown scale and patient rated each activity)  Follow up: When you first came in you had difficulty performing these activities.  Today do you still have difficulty?  Patient-Specific activity scoring scheme (Point to one number): PSFS  0 1 2 3 4 5 6 7 8 9  10 Unable                                                                                                          Able to perform To perform                                                                                                    activity at the same Activity         Level  as before                                                                                                                        Injury or problem  Activity  Unloading dishwasher                                          Initial: 5/10                      follow up:  2.   Standing for  shower                                            Initial:  3-4/10                     follow up:   PATIENT GOALS: To be able to walk/stand without hurting, avoid surgery for now.   NEXT MD VISIT:   OBJECTIVE:  Note: Objective measures were completed at Evaluation unless otherwise noted.  DIAGNOSTIC FINDINGS:  Right Hip Radiograph on 10/06/2023: FINDINGS: There is no evidence of hip fracture or dislocation. No significant arthropathy. No bony lesions identified. The bony pelvis is unremarkable.  Lumbar MRI on 11/23/23:  1. 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 2. Interval left laminectomy at L5-S1 without residual lateral recess stenosis. Unchanged severe left neural foraminal stenosis. 3. 3.2 cm abdominal aortic aneurysm. Recommend surveillance ultrasound in 3 years.  PATIENT SURVEYS:  Eval: Modified Oswestry: 19/50= 38% limitation  COGNITION: Overall cognitive status: Within functional limits for tasks assessed     SENSATION: Reports some numbness and tingling down his right leg, especially with prolonged standing.  MUSCLE LENGTH: Hamstrings: tightness noted bilat  POSTURE: rounded shoulders, forward head, and flexed trunk   PALPATION: Tenderness to palpation Rt gluteals and Lt lumbar spine  LOWER EXTREMITY ROM: Lumbar A/ROM: flexion full without pain, extension to neutral with Rt LE pain, Lt sidebending full without pain, Rt sidebending to neutral with pain  Rt hip flexibility: unable to perform figure 4 on Rt in sitting  LOWER EXTREMITY MMT:  Eval:   Right hip strength grossly 4-/5 Right hamstring strength is 4/5 Right quad strength is 5-/5 Left LE strength is WFL   FUNCTIONAL  ASSESSMENTS:  01/05/2024: 6 minute walk test:  929 ft with pain of 6/10 (patient requires standing recovery periods)  GAIT: Distance walked: >500 ft Assistive device utilized: None Level of assistance: Complete Independence Comments: Patient with antalgic gait with decreased stance time on right LE  TODAY'S TREATMENT  01/09/2024: Nustep level 5 (LE only) x 6.5 min with PT present to discuss status Seated hamstring stretch x 60  sec bilat Seated piriformis stretch L 2x20 sec Supine piriformis stretch R 2 x 20 sec Supine DKTC x 10  Sequential march x 5 B with TA contraction Table top heel taps x 10 B Bridge with alt knee extension x 5 B SKTC B due to some back pain Sidelying clamshell with green band 2x10 bilat Deadlift x 5 cues for form and keeping scapulae retracted Squat x 10, then to double foam on mat x 5 Standing glute max iso press into wall 10 sec hold 2x10 R&L; focus on keeping R hip trendelenburg Hip hike on bottom stair x 10 B for glute med    01/05/2024: Nustep level 5 (LE only) x5 min with PT present to discuss status Seated hamstring stretch 2x20 sec bilat 6 minute walk test:  929 ft with pain of 6/10 (patient requires standing recovery periods) Seated piriformis stretch 2x20 sec bilat Seated 3 way blue pball rollout 5x10 sec Standing shoulder rows with blue tband 2x10 Standing shoulder extension with blue tband 2x10 Standing paloff press with blue tband x10 bilat Supine marching with transversus abdominus contraction 2x10 Sidelying clamshell with yellow loop 2x10 bilat   01/02/24 Nustep L5 x 5 min LEs only Seated hamstring stretch leg on mat table 2x30" B - less strain on back Seated figure 4 stretch 2x30" Posterior pelvic tilt + marching 2x10 Sequential march x 10 B 90/90 alt foot taps 2 x 5 B Oblique crunches x 10 B Sidelying  Clamshell yellow loop 2x10 R&L Standing glute max iso press into wall 10 sec hold 2x10 R&L; focus on keeping R hip trendelenburg IASTM with ball to R gluteals and low back Standing rows Blue with TA contraction x 10 Pallof press blue band 5 sec isometric hold x 5 B Seated roll out on blue ball 3 way.  12/29/23 Nustep L5 x 5 min LEs only Seated hamstring stretch x30" Seated figure 4 stretch 2x30" Supine bridge 2x10 Posterior pelvic tilt x10 Posterior pelvic tilt + marching 2x10 Posterior pelvic tilt knee/hip flexion feet on ball 2x10 Supine posterior pelvic tilt + pball diagonal press downs x10 R&L Sidelying Clamshell yellow TB 2x10 R&L Attempted lateral hip shifts into wall; however, pt reports R LE radiation Standing glute max iso press into wall 2x10 R&L; focus on keeping R hip trendelenburg   PATIENT EDUCATION:  Education details: [email or web address] Person educated: Patient Education method: Explanation, Demonstration, and Handouts Education comprehension: verbalized understanding  HOME EXERCISE PROGRAM: Access Code: [email or web address] URL: [email or web address] Date: 01/09/2024 Prepared by: Raynelle Fanning  Exercises - Seated Scapular Retraction  - 1 x daily - 7 x weekly - 2 sets - 10 reps - Seated Hamstring Stretch  - 1 x daily - 7 x weekly - 2 reps - 20 sec hold - Clamshell  - 1 x daily - 7 x weekly - 2 sets - 10 reps - Seated Figure 4 Piriformis Stretch  - 1 x daily - 7 x weekly - 3 sets - 10 reps - Supine Figure 4 Piriformis Stretch  - 3 x daily - 7 x weekly - 1 sets - 3 reps - 20 hold - Hooklying Single Knee to Chest Stretch  - 3 x daily - 7 x weekly - 1 sets - 3 reps - 20 hold - Supine Double Knee to Chest  - 3 x daily - 7 x weekly - 1 sets -  3 reps - 20 hold - Hip Hiking on Step  - 1 x daily - 3 x weekly - 2 sets - 10 reps - Standing Isometric Hip Abduction with Knee at 90 at Wall  - 1 x daily - 3 x weekly - 2 sets - 10 reps - 5 sec hold  ASSESSMENT:  CLINICAL  IMPRESSION: Patient reporting no pain in hip, but low back continues to hurt. Tolerated core and hip strengthening well. Some pain with dead lifts, so worked on functional squats instead. He is still very challenged with glute med strengthening. HEP updated with these.  Patient continues to demonstrate potential for improvement and would benefit from continued skilled therapy to address impairments.    OBJECTIVE IMPAIRMENTS: decreased balance, difficulty walking, decreased strength, increased muscle spasms, impaired flexibility, postural dysfunction, and pain.     GOALS: Goals reviewed with patient? Yes  SHORT TERM GOALS: Target date: 01/18/24 Patient will be independent with initial HEP. Baseline: Goal status: Met on 01/05/24  2.  Patient will report at least a 25% improvement in Rt LE and low back pain with standing  Baseline:  Goal status: Met on 01/05/24  3.  Stand for > or = to 5-7 minutes for self-care tasks without limitation  Baseline: 3-5 minutes  Goal status: Ongoing   LONG TERM GOALS: Target date: 02/14/2024      Patient will be independent with advanced HEP to allow for self progression after discharge. Baseline:  Goal status: Ongoing  2.  Modified Oswestry to < or = to 10/50 (20%) for improved function Baseline: 19/50 (38%) Goal status: INITIAL  3.  Improve PSFS to > or = to 7/10 for unloading the dishwasher due to reduced pain Baseline: 5/10 Goal status: INITIAL  4.  Improve PSFS to > or = to 5-6/10 for taking a shower due to reduced pain Baseline: 3-4/10 Goal status: INITIAL  5. Report > or = to 40% reduction in low back and Rt LE symptoms with standing and walking   Baseline: 7-8/10  Goal Status: INITIAL   PLAN:  PT FREQUENCY: 2x/week  PT DURATION: 8 weeks  PLANNED INTERVENTIONS: 97164- PT Re-evaluation, 97110-Therapeutic exercises, 97530- Therapeutic activity, 97112- Neuromuscular re-education, 97535- Self Care, 16109- Manual therapy, [id number]- Gait  training, [phone]- Canalith repositioning, [id number]- Aquatic Therapy, 97014- Electrical stimulation (unattended), [id number]- Electrical stimulation (manual), 97016- Vasopneumatic device, [id number]- Ultrasound, [id number]- Traction (mechanical), Patient/Family education, Balance training, Stair training, Taping, Dry Needling, Joint mobilization, Joint manipulation, Spinal manipulation, Spinal mobilization, Cryotherapy, Moist heat, and Biofeedback  PLAN FOR NEXT SESSION: flexion based exercise, LE strength in sitting or supine/side lying as tolerated, manual trial of traction? lumbar mechanical traction, dry needling as needed   Solon Palm, PT 01/09/24, 1:34 PM [facility] [address] [place], [place] [address] Phone # [phone] Fax [phone]

## 2024-01-09 ENCOUNTER — Ambulatory Visit: Admitting: Physical Therapy

## 2024-01-09 ENCOUNTER — Encounter: Payer: Self-pay | Admitting: Physical Therapy

## 2024-01-09 DIAGNOSIS — M25551 Pain in right hip: Secondary | ICD-10-CM | POA: Diagnosis not present

## 2024-01-09 DIAGNOSIS — R252 Cramp and spasm: Secondary | ICD-10-CM

## 2024-01-09 DIAGNOSIS — M5459 Other low back pain: Secondary | ICD-10-CM

## 2024-01-09 DIAGNOSIS — M5417 Radiculopathy, lumbosacral region: Secondary | ICD-10-CM

## 2024-01-09 DIAGNOSIS — M6281 Muscle weakness (generalized): Secondary | ICD-10-CM

## 2024-01-11 ENCOUNTER — Ambulatory Visit (INDEPENDENT_AMBULATORY_CARE_PROVIDER_SITE_OTHER): Admitting: Family Medicine

## 2024-01-11 ENCOUNTER — Encounter: Payer: Self-pay | Admitting: Family Medicine

## 2024-01-11 VITALS — BP 124/86 | Ht 76.0 in | Wt 210.0 lb

## 2024-01-11 DIAGNOSIS — M48061 Spinal stenosis, lumbar region without neurogenic claudication: Secondary | ICD-10-CM | POA: Diagnosis not present

## 2024-01-11 MED ORDER — TRAMADOL HCL 50 MG PO TABS: 50.0000 mg | ORAL_TABLET | Freq: Three times a day (TID) | ORAL | 0 refills | Status: DC | PRN

## 2024-01-11 NOTE — Assessment & Plan Note (Signed)
-   Isaiah Price responded well to the epidural injection a week ago which resolved his right sided radicular pain.  He still has some ongoing soreness in the lumbar region. - We discussed the proposed L4-L5 fusion by spinal surgery, what it would entail, and expectations for going forward. - At this point he would like to continue injection therapy.  As long as he is able to tolerate it prior to getting surgery. - He has been using tramadol sparingly in between injections which helps him some when his symptoms are severe.  He has not had any falls or other significant side effects.  This was refilled today. -We will follow-up as needed.

## 2024-01-11 NOTE — Progress Notes (Signed)
 RUSSELL QUINNEY - 73 y.o. male MRN [id number]  Date of birth: 01-19-1951  PCP: Barbie Banner, MD  Subjective:  No chief complaint on file. Lower back pain with radiculitis  HPI: Past Medical, Surgical, Social, and Family History Reviewed & Updated per EMR.   Patient is a 73 y.o. male here to discuss his last visit with spinal surgery.  He received an epidural injection which completely resolved his right sided radicular leg pain.  He has been able to be more active but does have some ongoing lumbar pain.  He denies any numbness, weakness, loss of bowel or bladder.  He continues to work with physical therapy for strengthening of his pelvic floor and abductors.  He believes this is very beneficial.  He discussed the future need for an L4-L5 posterior fusion with Dr. Danielle Dess and wanted to discuss his expectations surrounding that surgery.  At this point he would like to continue with injection therapy for as long as possible  Past Medical History:  Diagnosis Date   Achilles tendinitis, left leg    At risk for sleep apnea    STOP--BANG SCORE= 5  (routed to pt's pcp in epic 11-13-2018)   Cervicogenic headache 06/08/2021   Chronic insomnia 12/31/2015   Chronic neck pain    Congenital pectus excavatum    per pt had pulmonary test ,  60% restrictive by states asymptomatic   DDD (degenerative disc disease), cervical    Epicondylitis, lateral, right    Gait abnormality 06/08/2021   GERD (gastroesophageal reflux disease)    History of exercise intolerance    09-02-2017  ETT,  negative for ischemia   History of pericarditis 1985   HTN (hypertension)    Hypercholesteremia 12/31/2015   Iritis    IT band syndrome 05/06/2014   Lower urinary tract symptoms (LUTS)    Neuropathy    bilateral arms with tingling and numbness at times due to cervical neck   OA (osteoarthritis)    Polyarthralgia    Prostate cancer [facility])    urologist-  dr Laverle Patter--- Stage T1c, Gleason 3+4, 06/08/21 recurrent prostate cancer    Rheumatoid arthritis [facility])    rheumatologist-- dr Dierdre Forth--- seropositive,multiple sites   Tendon disorder 2022   torn tendon, right arm   Wears glasses     Current Outpatient Medications on File Prior to Visit  Medication Sig Dispense Refill   allopurinol (ZYLOPRIM) 100 MG tablet Take 200 mg by mouth daily.     diltiazem (CARDIZEM) 90 MG tablet Take 90 mg by mouth at bedtime.      fluticasone (FLONASE) 50 MCG/ACT nasal spray Place 1 spray into the nose daily as needed for allergies.      HYDROcodone-acetaminophen (NORCO) 10-325 MG tablet TAKE 1 TABLET EVERY 6 HOURS AS NEEDED FOR MODERATE TO SEVERE PAIN. 20 tablet 0   nortriptyline (PAMELOR) 10 MG capsule Take 2 capsules at night 180 capsule 3   omeprazole (PRILOSEC) 40 MG capsule Take 40 mg by mouth daily.      Polyvinyl Alcohol-Povidone (REFRESH OP) Place 1 drop into both eyes 2 (two) times daily as needed (dry eyes).      pregabalin (LYRICA) 25 MG capsule Take 2 capsules (50 mg total) by mouth 2 (two) times daily. 120 capsule 1   rosuvastatin (CRESTOR) 40 MG tablet TAKE 1 TABLET BY MOUTH DAILY 90 tablet 3   tretinoin (RETIN-A) 0.05 % cream Apply a thin layer to face nightly     zolpidem (AMBIEN) 10 MG tablet  Take 10 mg by mouth at bedtime as needed for sleep.     Current Facility-Administered Medications on File Prior to Visit  Medication Dose Route Frequency Provider Last Rate Last Admin   hydrocortisone sodium succinate (SOLU-CORTEF) injection 100 mg  100 mg Intravenous Once Heloise Purpura, MD        Past Surgical History:  Procedure Laterality Date   ANTERIOR CERVICAL DECOMP/DISCECTOMY FUSION  28   dr Newell Coral   C5 -- 7  (per pt fusion failed)   CARPAL TUNNEL RELEASE Right done at same time of CMC fusion   CARPOMETACARPAL (CMC) FUSION OF THUMB Bilateral 2015 and 2014  approx.   CATARACT EXTRACTION W/ INTRAOCULAR LENS  IMPLANT, BILATERAL  2013   LYMPHADENECTOMY Bilateral 11/16/2018   Procedure: LYMPHADENECTOMY, PELVIC;   Surgeon: Heloise Purpura, MD;  Location: [facility];  Service: Urology;  Laterality: Bilateral;   ROBOT ASSISTED LAPAROSCOPIC RADICAL PROSTATECTOMY N/A 11/16/2018   Procedure: XI ROBOTIC ASSISTED LAPAROSCOPIC RADICAL PROSTATECTOMY LEVEL 2;  Surgeon: Heloise Purpura, MD;  Location: [facility];  Service: Urology;  Laterality: N/A;   SHOULDER SURGERY Left age 43   TONSILLECTOMY  child    Allergies  Allergen Reactions   Nsaids Other (See Comments)    Causes GI bleeds (numerous times)   Brimonidine Itching and Other (See Comments)    redness Abdominal Pain   Gabapentin Other (See Comments)    Couldn't talk   Meloxicam Other (See Comments)   Promethazine Hcl     Vision changes   Trazodone     Other reaction(s): Other (See Comments) Bladder incontinence        Objective:  Physical Exam: VS: BP:124/86  HR: bpm  TEMP: ( )  RESP:   HT:6\' 4"  (193 cm)   WT:210 lb (95.3 kg)  BMI:25.57  Gen: NAD, speaks clearly, comfortable in exam room Respiratory: Normal respiratory effort on room air. No signs of distress Skin: No rashes, abrasions, or ecchymosis MSK:  The right and left hip have full range of motion and strength Slightly antalgic gait No sensory deficits distally    Assessment & Plan:   Spinal stenosis of lumbar region - Jim responded well to the epidural injection a week ago which resolved his right sided radicular pain.  He still has some ongoing soreness in the lumbar region. - We discussed the proposed L4-L5 fusion by spinal surgery, what it would entail, and expectations for going forward. - At this point he would like to continue injection therapy.  As long as he is able to tolerate it prior to getting surgery. - He has been using tramadol sparingly in between injections which helps him some when his symptoms are severe.  He has not had any falls or other significant side effects.  This was refilled today. -We will follow-up as needed.    Rica Mote MD [facility]  Medicine Fellow

## 2024-01-11 NOTE — Addendum Note (Signed)
 Addended by: Andi Devon on: 01/11/2024 01:54 PM   Modules accepted: Level of Service

## 2024-01-12 ENCOUNTER — Encounter: Payer: Self-pay | Admitting: Rehabilitative and Restorative Service Providers"

## 2024-01-12 ENCOUNTER — Ambulatory Visit: Admitting: Rehabilitative and Restorative Service Providers"

## 2024-01-12 DIAGNOSIS — R252 Cramp and spasm: Secondary | ICD-10-CM

## 2024-01-12 DIAGNOSIS — M5417 Radiculopathy, lumbosacral region: Secondary | ICD-10-CM

## 2024-01-12 DIAGNOSIS — M6281 Muscle weakness (generalized): Secondary | ICD-10-CM

## 2024-01-12 DIAGNOSIS — M25551 Pain in right hip: Secondary | ICD-10-CM | POA: Diagnosis not present

## 2024-01-12 DIAGNOSIS — M5459 Other low back pain: Secondary | ICD-10-CM

## 2024-01-12 NOTE — Therapy (Signed)
 OUTPATIENT PHYSICAL THERAPY LUMBAR TREATMENT   Patient Name: Isaiah Price MRN: [id number] DOB:1951-08-02, 73 y.o., male Today's Date: 01/12/2024  END OF SESSION:  PT End of Session - 01/12/24 1149     Visit Number 6    Date for PT Re-Evaluation 02/15/24    Authorization Type Medicare/ BCBC    Progress Note Due on Visit 10    PT Start Time 1146    PT Stop Time 1225    PT Time Calculation (min) 39 min    Activity Tolerance Patient tolerated treatment well    Behavior During Therapy WFL for tasks assessed/performed                 Past Medical History:  Diagnosis Date   Achilles tendinitis, left leg    At risk for sleep apnea    STOP--BANG SCORE= 5  (routed to pt's pcp in epic 11-13-2018)   Cervicogenic headache 06/08/2021   Chronic insomnia 12/31/2015   Chronic neck pain    Congenital pectus excavatum    per pt had pulmonary test ,  60% restrictive by states asymptomatic   DDD (degenerative disc disease), cervical    Epicondylitis, lateral, right    Gait abnormality 06/08/2021   GERD (gastroesophageal reflux disease)    History of exercise intolerance    09-02-2017  ETT,  negative for ischemia   History of pericarditis 1985   HTN (hypertension)    Hypercholesteremia 12/31/2015   Iritis    IT band syndrome 05/06/2014   Lower urinary tract symptoms (LUTS)    Neuropathy    bilateral arms with tingling and numbness at times due to cervical neck   OA (osteoarthritis)    Polyarthralgia    Prostate cancer [facility])    urologist-  dr Laverle Patter--- Stage T1c, Gleason 3+4, 06/08/21 recurrent prostate cancer   Rheumatoid arthritis [facility])    rheumatologist-- dr Dierdre Forth--- seropositive,multiple sites   Tendon disorder 2022   torn tendon, right arm   Wears glasses    Past Surgical History:  Procedure Laterality Date   ANTERIOR CERVICAL DECOMP/DISCECTOMY FUSION  1991   dr Newell Coral   C5 -- 7  (per pt fusion failed)   CARPAL TUNNEL RELEASE Right done at same time of CMC fusion    CARPOMETACARPAL (CMC) FUSION OF THUMB Bilateral 2015 and 2014  approx.   CATARACT EXTRACTION W/ INTRAOCULAR LENS  IMPLANT, BILATERAL  2013   LYMPHADENECTOMY Bilateral 11/16/2018   Procedure: LYMPHADENECTOMY, PELVIC;  Surgeon: Heloise Purpura, MD;  Location: [facility];  Service: Urology;  Laterality: Bilateral;   ROBOT ASSISTED LAPAROSCOPIC RADICAL PROSTATECTOMY N/A 11/16/2018   Procedure: XI ROBOTIC ASSISTED LAPAROSCOPIC RADICAL PROSTATECTOMY LEVEL 2;  Surgeon: Heloise Purpura, MD;  Location: [facility];  Service: Urology;  Laterality: N/A;   SHOULDER SURGERY Left age 64   TONSILLECTOMY  child   Patient Active Problem List   Diagnosis Date Noted   Spinal stenosis of lumbar region 01/11/2024   Dyslipidemia 10/02/2023   Aortic root enlargement (HCC) 10/02/2023   Elevated coronary artery calcium score 10/02/2023   Sciatic pain, right 09/07/2023   Palpitations 09/27/2021   Cervicogenic headache 06/08/2021   Gait abnormality 06/08/2021   Prostate cancer (HCC) 11/16/2018   Malignant neoplasm of prostate (HCC) 09/18/2018   Daytime somnolence 08/04/2017   Dyspnea on exertion 08/04/2017   Exercise intolerance 08/04/2017   Need for influenza vaccination 07/01/2017   Ulnar neuropathy at elbow, 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   Essential hypertension 01/12/2017   Groin strain 11/18/2016  Irregular bowel habits 11/18/2016   Rheumatoid arthritis (HCC) 01/22/2016   Chronic insomnia 12/31/2015   Chronic pain 12/31/2015   Esophageal reflux 12/31/2015   Hypercholesteremia 12/31/2015   Chronic arthritis 12/31/2015   Wrist pain, right 12/01/2015   IT band syndrome 05/06/2014   Cervical radiculopathy 07/14/2011   Arthritis pain, hand 06/02/2011   Osteoarthritis of CMC joint of thumb 06/02/2011    PCP: Barbie Banner, MD  REFERRING PROVIDER: Ivor Messier, MD  REFERRING DIAG:  Diagnosis  M43.16 (ICD-10-CM) - Spondylolisthesis at L4-L5 level   THERAPY DIAG:  Muscle weakness (generalized)  Pain in  right hip  Radiculopathy, lumbosacral region  Cramp and spasm  Other low back pain  Rationale for Evaluation and Treatment: Rehabilitation  ONSET DATE: October 2024  SUBJECTIVE:   SUBJECTIVE STATEMENT Patient reports that the past week, his legs have started to feel like lead.  Denies current pain, but states that he still can have pain with ambulation.  PERTINENT HISTORY: RA, IT band syndrome, ACDF of C5-7 in 1991, Lumbar Laminectomy L5-S1 October 2023  PAIN: 12/21/23 Are you having pain? Yes: NPRS scale: 0-3/10 Pain location: low back Pain description: shooting, sharp, sometimes aching and radiating down Rt LE Aggravating factors: standing and walking Relieving factors: sitting  PRECAUTIONS: None  RED FLAGS: None   WEIGHT BEARING RESTRICTIONS: No  FALLS:  Has patient fallen in last 6 months? No  LIVING ENVIRONMENT: Lives with: lives with their spouse Lives in: House/apartment Stairs:  one story home Has following equipment at home: Grab bars  OCCUPATION: retired, formerly a Designer, television/film set  PLOF: Independent and Leisure: yard work Standing: 3-5 minutes static, moving around 5-8 min   The Patient-Specific Functional Scale  Initial:  I am going to ask you to identify up to 3 important activities that you are unable to do or are having difficulty with as a result of this problem.  Today are there any activities that you are unable to do or having difficulty with because of this?  (Patient shown scale and patient rated each activity)  Follow up: When you first came in you had difficulty performing these activities.  Today do you still have difficulty?  Patient-Specific activity scoring scheme (Point to one number): PSFS  0 1 2 3 4 5 6 7 8 9  10 Unable                                                                                                          Able to perform To perform  activity at the same Activity         Level as before                                                                                                                       Injury or problem  Activity  Unloading dishwasher                                          Initial: 5/10                      follow up:  2.   Standing for  shower                                            Initial:  3-4/10                     follow up:   PATIENT GOALS: To be able to walk/stand without hurting, avoid surgery for now.   NEXT MD VISIT:   OBJECTIVE:  Note: Objective measures were completed at Evaluation unless otherwise noted.  DIAGNOSTIC FINDINGS:  Right Hip Radiograph on 10/06/2023: FINDINGS: There is no evidence of hip fracture or dislocation. No significant arthropathy. No bony lesions identified. The bony pelvis is unremarkable.  Lumbar MRI on 11/23/23:  1. 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 2. Interval left laminectomy at L5-S1 without residual lateral recess stenosis. Unchanged severe left neural foraminal stenosis. 3. 3.2 cm abdominal aortic aneurysm. Recommend surveillance ultrasound in 3 years.  PATIENT SURVEYS:  Eval: Modified Oswestry: 19/50= 38% limitation  COGNITION: Overall cognitive status: Within functional limits for tasks assessed     SENSATION: Reports some numbness and tingling down his right leg, especially with prolonged standing.  MUSCLE LENGTH: Hamstrings: tightness noted bilat  POSTURE: rounded shoulders, forward head, and flexed trunk   PALPATION: Tenderness to palpation Rt gluteals and Lt lumbar spine  LOWER EXTREMITY ROM: Lumbar A/ROM: flexion full without pain, extension to neutral with Rt LE pain, Lt sidebending full without pain, Rt sidebending to neutral with pain  Rt hip flexibility: unable to perform figure 4 on Rt in sitting  LOWER EXTREMITY MMT:  Eval:   Right hip strength grossly 4-/5 Right  hamstring strength is 4/5 Right quad strength is 5-/5 Left LE strength is WFL   FUNCTIONAL ASSESSMENTS:  01/05/2024: 6 minute walk test:  929 ft with pain of 6/10 (patient requires standing recovery periods)  GAIT: Distance walked: >500 ft Assistive device utilized: None Level of assistance: Complete Independence Comments: Patient with antalgic gait with decreased stance time on right LE  TODAY'S TREATMENT  01/12/2024: Nustep level 5 (LE only) x 6 min with PT present to discuss status Seated hamstring stretch 2x20 sec bilat Seated piriformis stretch 2x20 sec bilat Supine double knee to chest (DTKC) 2x15 sec Supine marching with transversus abdominus contraction 2x10 Supine 90/90 heel tap down x10 Bridge with alt knee extension x 5 bilat Sidelying clamshell with green tband 2x10 bilat Sidelying reverse clamshell 2x10 bilat Standing shoulder rows with blue tband 2x10 Standing shoulder extension with blue tband 2x10 Standing paloff press with blue tband x10 bilat Standing hip abduction (glute med) iso press into wall 10 sec hold 2x5 bilat   01/09/2024: Nustep level 5 (LE only) x 6.5 min with PT present to discuss status Seated hamstring stretch x 60  sec bilat Seated piriformis stretch L 2x20 sec Supine piriformis stretch R 2 x 20 sec Supine DKTC x 10  Sequential march x 5 B with TA contraction Table top heel taps x 10 B Bridge with alt knee extension x 5 B SKTC B due to some back pain Sidelying clamshell with green band 2x10 bilat Deadlift x 5 cues for form and keeping scapulae retracted Squat x 10, then to double foam on mat x 5 Standing glute max iso press into wall 10 sec hold 2x10 R&L; focus on keeping R hip trendelenburg Hip hike on bottom stair x 10 B for glute med    01/05/2024: Nustep level 5 (LE only) x5 min with PT present to discuss  status Seated hamstring stretch 2x20 sec bilat 6 minute walk test:  929 ft with pain of 6/10 (patient requires standing recovery periods) Seated piriformis stretch 2x20 sec bilat Seated 3 way blue pball rollout 5x10 sec Standing shoulder rows with blue tband 2x10 Standing shoulder extension with blue tband 2x10 Standing paloff press with blue tband x10 bilat Supine marching with transversus abdominus contraction 2x10 Sidelying clamshell with yellow loop 2x10 bilat     PATIENT EDUCATION:  Education details: Research scientist (medical) Person educated: Patient Education method: Explanation, Facilities manager, and Handouts Education comprehension: verbalized understanding  HOME EXERCISE PROGRAM: Access Code: [email or web address] URL: [email or web address] Date: 01/09/2024 Prepared by: Raynelle Fanning  Exercises - Seated Scapular Retraction  - 1 x daily - 7 x weekly - 2 sets - 10 reps - Seated Hamstring Stretch  - 1 x daily - 7 x weekly - 2 reps - 20 sec hold - Clamshell  - 1 x daily - 7 x weekly - 2 sets - 10 reps - Seated Figure 4 Piriformis Stretch  - 1 x daily - 7 x weekly - 3 sets - 10 reps - Supine Figure 4 Piriformis Stretch  - 3 x daily - 7 x weekly - 1 sets - 3 reps - 20 hold - Hooklying Single Knee to Chest Stretch  - 3 x daily - 7 x weekly - 1 sets - 3 reps - 20 hold - Supine Double Knee to Chest  - 3 x daily - 7 x weekly - 1 sets - 3 reps - 20 hold - Hip Hiking on Step  - 1 x daily - 3 x weekly - 2 sets - 10 reps - Standing Isometric Hip Abduction with Knee at 90 at Wall  - 1 x daily - 3 x weekly - 2 sets - 10 reps - 5 sec hold  ASSESSMENT:  CLINICAL IMPRESSION: Mr Jutte presents to skilled PT with some complaints of cramping in his hip region.  Patient continues to progress with core strengthening and glute strengthening exercises.  Patient requires UE support rod with isometric hip abduction to be able to maintain proper pelvic alignment.  Patient does state that he is pleased that he can start  doing some yard work this season.  Patient continues to require skilled PT to progress towards goal related activities.  OBJECTIVE IMPAIRMENTS: decreased balance, difficulty walking, decreased strength, increased muscle spasms, impaired flexibility, postural dysfunction, and pain.     GOALS: Goals reviewed with patient? Yes  SHORT TERM GOALS: Target date: 01/18/24 Patient will be independent with initial HEP. Baseline: Goal status: Met on 01/05/24  2.  Patient will report at least a 25% improvement in Rt LE and low back pain with standing  Baseline:  Goal status: Met on 01/05/24  3.  Stand for > or = to 5-7 minutes for self-care tasks without limitation  Baseline: 3-5 minutes  Goal status: Ongoing   LONG TERM GOALS: Target date: 02/14/2024      Patient will be independent with advanced HEP to allow for self progression after discharge. Baseline:  Goal status: Ongoing  2.  Modified Oswestry to < or = to 10/50 (20%) for improved function Baseline: 19/50 (38%) Goal status: INITIAL  3.  Improve PSFS to > or = to 7/10 for unloading the dishwasher due to reduced pain Baseline: 5/10 Goal status: INITIAL  4.  Improve PSFS to > or = to 5-6/10 for taking a shower due to reduced pain Baseline: 3-4/10 Goal status: INITIAL  5. Report > or = to 40% reduction in low back and Rt LE symptoms with standing and walking   Baseline: 7-8/10  Goal Status: INITIAL   PLAN:  PT FREQUENCY: 2x/week  PT DURATION: 8 weeks  PLANNED INTERVENTIONS: 97164- PT Re-evaluation, 97110-Therapeutic exercises, 97530- Therapeutic activity, 97112- Neuromuscular re-education, 97535- Self Care, 16109- Manual therapy, [id number]- Gait training, [phone]- Canalith repositioning, [id number]- Aquatic Therapy, 97014- Electrical stimulation (unattended), [id number]- Electrical stimulation (manual), 97016- Vasopneumatic device, [id number]- Ultrasound, [id number]- Traction (mechanical), Patient/Family education, Balance training, Stair training,  Taping, Dry Needling, Joint mobilization, Joint manipulation, Spinal manipulation, Spinal mobilization, Cryotherapy, Moist heat, and Biofeedback  PLAN FOR NEXT SESSION: flexion based exercise, LE strength in sitting or supine/side lying as tolerated, manual trial of traction? lumbar mechanical traction, dry needling as needed   Reather Laurence, PT, DPT 01/12/24, 12:38 PM  [facility] [address], [address] [place], [place] [address] Phone # [phone] Fax [phone]

## 2024-01-13 NOTE — Therapy (Signed)
 OUTPATIENT PHYSICAL THERAPY LUMBAR TREATMENT   Patient Name: Isaiah Price MRN: [id number] DOB:[birth year], 73 y.o., male Today's Date: 01/16/2024  END OF SESSION:  PT End of Session - 01/16/24 1321     Visit Number 7    Date for PT Re-Evaluation 02/15/24    Authorization Type Medicare/ BCBC    Progress Note Due on Visit 10    PT Start Time 1150    PT Stop Time 1229    PT Time Calculation (min) 39 min    Activity Tolerance Patient tolerated treatment well    Behavior During Therapy WFL for tasks assessed/performed                  Past Medical History:  Diagnosis Date   Achilles tendinitis, left leg    At risk for sleep apnea    STOP--BANG SCORE= 5  (routed to pt's pcp in epic 11-13-2018)   Cervicogenic headache 06/08/2021   Chronic insomnia 12/31/2015   Chronic neck pain    Congenital pectus excavatum    per pt had pulmonary test ,  60% restrictive by states asymptomatic   DDD (degenerative disc disease), cervical    Epicondylitis, lateral, right    Gait abnormality 06/08/2021   GERD (gastroesophageal reflux disease)    History of exercise intolerance    09-02-2017  ETT,  negative for ischemia   History of pericarditis 1985   HTN (hypertension)    Hypercholesteremia 12/31/2015   Iritis    IT band syndrome 05/06/2014   Lower urinary tract symptoms (LUTS)    Neuropathy    bilateral arms with tingling and numbness at times due to cervical neck   OA (osteoarthritis)    Polyarthralgia    Prostate cancer [facility])    urologist-  dr Laverle Patter--- Stage T1c, Gleason 3+4, 06/08/21 recurrent prostate cancer   Rheumatoid arthritis [facility])    rheumatologist-- dr Dierdre Forth--- seropositive,multiple sites   Tendon disorder 2022   torn tendon, right arm   Wears glasses    Past Surgical History:  Procedure Laterality Date   ANTERIOR CERVICAL DECOMP/DISCECTOMY FUSION  1991   dr Newell Coral   C5 -- 7  (per pt fusion failed)   CARPAL TUNNEL RELEASE Right done at same time of CMC fusion    CARPOMETACARPAL (CMC) FUSION OF THUMB Bilateral 2015 and 2014  approx.   CATARACT EXTRACTION W/ INTRAOCULAR LENS  IMPLANT, BILATERAL  2013   LYMPHADENECTOMY Bilateral 11/16/2018   Procedure: LYMPHADENECTOMY, PELVIC;  Surgeon: Heloise Purpura, MD;  Location: [facility];  Service: Urology;  Laterality: Bilateral;   ROBOT ASSISTED LAPAROSCOPIC RADICAL PROSTATECTOMY N/A 11/16/2018   Procedure: XI ROBOTIC ASSISTED LAPAROSCOPIC RADICAL PROSTATECTOMY LEVEL 2;  Surgeon: Heloise Purpura, MD;  Location: [facility];  Service: Urology;  Laterality: N/A;   SHOULDER SURGERY Left age 76   TONSILLECTOMY  child   Patient Active Problem List   Diagnosis Date Noted   Spinal stenosis of lumbar region 01/11/2024   Dyslipidemia 10/02/2023   Aortic root enlargement (HCC) 10/02/2023   Elevated coronary artery calcium score 10/02/2023   Sciatic pain, right 09/07/2023   Palpitations 09/27/2021   Cervicogenic headache 06/08/2021   Gait abnormality 06/08/2021   Prostate cancer (HCC) 11/16/2018   Malignant neoplasm of prostate (HCC) 09/18/2018   Daytime somnolence 08/04/2017   Dyspnea on exertion 08/04/2017   Exercise intolerance 08/04/2017   Need for influenza vaccination 07/01/2017   Ulnar neuropathy at elbow, 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   Essential hypertension 01/12/2017   Groin strain 11/18/2016  Irregular bowel habits 11/18/2016   Rheumatoid arthritis (HCC) 01/22/2016   Chronic insomnia 12/31/2015   Chronic pain 12/31/2015   Esophageal reflux 12/31/2015   Hypercholesteremia 12/31/2015   Chronic arthritis 12/31/2015   Wrist pain, right 12/01/2015   IT band syndrome 05/06/2014   Cervical radiculopathy 07/14/2011   Arthritis pain, hand 06/02/2011   Osteoarthritis of CMC joint of thumb 06/02/2011    PCP: Barbie Banner, MD  REFERRING PROVIDER: Ivor Messier, MD  REFERRING DIAG:  Diagnosis  M43.16 (ICD-10-CM) - Spondylolisthesis at L4-L5 level   THERAPY DIAG:  Muscle weakness (generalized)  Pain in  right hip  Radiculopathy, lumbosacral region  Cramp and spasm  Other low back pain  Piriformis syndrome, left  Rationale for Evaluation and Treatment: Rehabilitation  ONSET DATE: October 2024  SUBJECTIVE:   SUBJECTIVE STATEMENT Fell yesterday when I tripped on a bush I was planting. Came down hard on the patio and the wheelbarrow.  PERTINENT HISTORY: RA, IT band syndrome, ACDF of C5-7 in 1991, Lumbar Laminectomy L5-S1 October 2023  PAIN: 12/21/23 Are you having pain? Yes: NPRS scale: 2/10 Pain location: low back Pain description: shooting, sharp, sometimes aching and radiating down Rt LE Aggravating factors: standing and walking Relieving factors: sitting  PRECAUTIONS: None  RED FLAGS: None   WEIGHT BEARING RESTRICTIONS: No  FALLS:  Has patient fallen in last 6 months? No  LIVING ENVIRONMENT: Lives with: lives with their spouse Lives in: House/apartment Stairs:  one story home Has following equipment at home: Grab bars  OCCUPATION: retired, formerly a Designer, television/film set  PLOF: Independent and Leisure: yard work Standing: 3-5 minutes static, moving around 5-8 min   The Patient-Specific Functional Scale  Initial:  I am going to ask you to identify up to 3 important activities that you are unable to do or are having difficulty with as a result of this problem.  Today are there any activities that you are unable to do or having difficulty with because of this?  (Patient shown scale and patient rated each activity)  Follow up: When you first came in you had difficulty performing these activities.  Today do you still have difficulty?  Patient-Specific activity scoring scheme (Point to one number): PSFS  0 1 2 3 4 5 6 7 8 9  10 Unable                                                                                                          Able to perform To perform  activity at the same Activity         Level as before                                                                                                                       Injury or problem  Activity  Unloading dishwasher                                          Initial: 5/10                      follow up:  2.   Standing for  shower                                            Initial:  3-4/10                     follow up:   PATIENT GOALS: To be able to walk/stand without hurting, avoid surgery for now.   NEXT MD VISIT:   OBJECTIVE:  Note: Objective measures were completed at Evaluation unless otherwise noted.  DIAGNOSTIC FINDINGS:  Right Hip Radiograph on 10/06/2023: FINDINGS: There is no evidence of hip fracture or dislocation. No significant arthropathy. No bony lesions identified. The bony pelvis is unremarkable.  Lumbar MRI on 11/23/23:  1. 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 2. Interval left laminectomy at L5-S1 without residual lateral recess stenosis. Unchanged severe left neural foraminal stenosis. 3. 3.2 cm abdominal aortic aneurysm. Recommend surveillance ultrasound in 3 years.  PATIENT SURVEYS:  Eval: Modified Oswestry: 19/50= 38% limitation  COGNITION: Overall cognitive status: Within functional limits for tasks assessed     SENSATION: Reports some numbness and tingling down his right leg, especially with prolonged standing.  MUSCLE LENGTH: Hamstrings: tightness noted bilat  POSTURE: rounded shoulders, forward head, and flexed trunk   PALPATION: Tenderness to palpation Rt gluteals and Lt lumbar spine  LOWER EXTREMITY ROM: Lumbar A/ROM: flexion full without pain, extension to neutral with Rt LE pain, Lt sidebending full without pain, Rt sidebending to neutral with pain  Rt hip flexibility: unable to perform figure 4 on Rt in sitting  LOWER EXTREMITY MMT:  Eval:   Right hip strength grossly 4-/5 Right hamstring strength is  4/5 Right quad strength is 5-/5 Left LE strength is WFL   FUNCTIONAL ASSESSMENTS:  01/05/2024: 6 minute walk test:  929 ft with pain of 6/10 (patient requires standing recovery periods)  GAIT: Distance walked: >500 ft Assistive device utilized: None Level of assistance: Complete Independence Comments: Patient with antalgic gait with decreased stance time on right LE  TODAY'S TREATMENT  01/16/2024: Nustep level 1 (LE only) x 6 min with PT present to discuss status Seated hamstring stretch with leg on table x 60  sec bilat Seated piriformis stretch x 60 sec B Supine double knee to chest (DTKC) 2x15 sec Bridge with alt knee extension x 5 bilat TA+ SLR 2 x 10 B Supine 90/90 heel tap down x10 B  90/90 alt leg press x 10 B Sidelying clamshell with green tband 2x10 bilat Open book x 5 B then 5 breath hold at end Standing shoulder rows with blue tband 2x10 did in tandem stance but could only do with R leg fwd Standing shoulder extension with blue tband 2x10 Standing paloff press with blue tband x10 bilat   01/12/2024: Nustep level 5 (LE only) x 6 min with PT present to discuss status Seated hamstring stretch 2x20 sec bilat Seated piriformis stretch 2x20 sec bilat Supine double knee to chest (DTKC) 2x15 sec Supine marching with transversus abdominus contraction 2x10 Supine 90/90 heel tap down x10 Bridge with alt knee extension x 5 bilat Sidelying clamshell with green tband 2x10 bilat Sidelying reverse clamshell 2x10 bilat Standing shoulder rows with blue tband 2x10 Standing shoulder extension with blue tband 2x10 Standing paloff press with blue tband x10 bilat Standing hip abduction (glute med) iso press into wall 10 sec hold 2x5 bilat   01/09/2024: Nustep level 5 (LE only) x 6.5 min with PT present to discuss status Seated hamstring stretch x 60  sec  bilat Seated piriformis stretch L 2x20 sec Supine piriformis stretch R 2 x 20 sec Supine DKTC x 10  Sequential march x 5 B with TA contraction Table top heel taps x 10 B Bridge with alt knee extension x 5 B SKTC B due to some back pain Sidelying clamshell with green band 2x10 bilat Deadlift x 5 cues for form and keeping scapulae retracted Squat x 10, then to double foam on mat x 5 Standing glute max iso press into wall 10 sec hold 2x10 R&L; focus on keeping R hip trendelenburg Hip hike on bottom stair x 10 B for glute med   PATIENT EDUCATION:  Education details: Research scientist (medical) Person educated: Patient Education method: Explanation, Demonstration, and Handouts Education comprehension: verbalized understanding  HOME EXERCISE PROGRAM: Access Code: [email or web address] URL: [email or web address] Date: 01/09/2024 Prepared by: Raynelle Fanning  Exercises - Seated Scapular Retraction  - 1 x daily - 7 x weekly - 2 sets - 10 reps - Seated Hamstring Stretch  - 1 x daily - 7 x weekly - 2 reps - 20 sec hold - Clamshell  - 1 x daily - 7 x weekly - 2 sets - 10 reps - Seated Figure 4 Piriformis Stretch  - 1 x daily - 7 x weekly - 3 sets - 10 reps - Supine Figure 4 Piriformis Stretch  - 3 x daily - 7 x weekly - 1 sets - 3 reps - 20 hold - Hooklying Single Knee to Chest Stretch  - 3 x daily - 7 x weekly - 1 sets - 3 reps - 20 hold - Supine Double Knee to Chest  - 3 x daily - 7 x weekly - 1 sets - 3 reps - 20 hold - Hip Hiking on Step  - 1 x daily - 3 x weekly - 2 sets - 10 reps - Standing Isometric Hip Abduction with Knee at 90 at Wall  - 1 x daily - 3 x weekly - 2 sets - 10 reps - 5 sec  hold  ASSESSMENT:  CLINICAL IMPRESSION: Patient with increased soreness today after a fall yesterday. He tolerated all exercises without complaint. He demonstrates LOB intermittently due to R LE weakness. Would benefit from more SLS activities on the R to strengthen gluteals and improve balance.  OBJECTIVE IMPAIRMENTS:  decreased balance, difficulty walking, decreased strength, increased muscle spasms, impaired flexibility, postural dysfunction, and pain.     GOALS: Goals reviewed with patient? Yes  SHORT TERM GOALS: Target date: 01/18/24 Patient will be independent with initial HEP. Baseline: Goal status: Met on 01/05/24  2.  Patient will report at least a 25% improvement in Rt LE and low back pain with standing  Baseline:  Goal status: Met on 01/05/24  3.  Stand for > or = to 5-7 minutes for self-care tasks without limitation  Baseline: 3-5 minutes  Goal status: Ongoing   LONG TERM GOALS: Target date: 02/14/2024      Patient will be independent with advanced HEP to allow for self progression after discharge. Baseline:  Goal status: Ongoing  2.  Modified Oswestry to < or = to 10/50 (20%) for improved function Baseline: 19/50 (38%) Goal status: INITIAL  3.  Improve PSFS to > or = to 7/10 for unloading the dishwasher due to reduced pain Baseline: 5/10 Goal status: INITIAL  4.  Improve PSFS to > or = to 5-6/10 for taking a shower due to reduced pain Baseline: 3-4/10 Goal status: INITIAL  5. Report > or = to 40% reduction in low back and Rt LE symptoms with standing and walking   Baseline: 7-8/10  Goal Status: INITIAL   PLAN:  PT FREQUENCY: 2x/week  PT DURATION: 8 weeks  PLANNED INTERVENTIONS: 97164- PT Re-evaluation, 97110-Therapeutic exercises, 97530- Therapeutic activity, 97112- Neuromuscular re-education, 97535- Self Care, 16109- Manual therapy, [id number]- Gait training, [phone]- Canalith repositioning, [id number]- Aquatic Therapy, 97014- Electrical stimulation (unattended), [id number]- Electrical stimulation (manual), 97016- Vasopneumatic device, [id number]- Ultrasound, [id number]- Traction (mechanical), Patient/Family education, Balance training, Stair training, Taping, Dry Needling, Joint mobilization, Joint manipulation, Spinal manipulation, Spinal mobilization, Cryotherapy, Moist heat, and  Biofeedback  PLAN FOR NEXT SESSION: flexion based exercise, LE strength in sitting or supine/side lying as tolerated, Glute med strength, SLS   Solon Palm, PT  01/16/24, 1:23 PM  [facility] [address], [address] [place], [place] [address] Phone # [phone] Fax [phone]

## 2024-01-16 ENCOUNTER — Encounter: Payer: Self-pay | Admitting: Physical Therapy

## 2024-01-16 ENCOUNTER — Ambulatory Visit: Admitting: Physical Therapy

## 2024-01-16 DIAGNOSIS — M25551 Pain in right hip: Secondary | ICD-10-CM | POA: Diagnosis not present

## 2024-01-16 DIAGNOSIS — M5459 Other low back pain: Secondary | ICD-10-CM

## 2024-01-16 DIAGNOSIS — G5702 Lesion of sciatic nerve, left lower limb: Secondary | ICD-10-CM

## 2024-01-16 DIAGNOSIS — M5417 Radiculopathy, lumbosacral region: Secondary | ICD-10-CM

## 2024-01-16 DIAGNOSIS — R252 Cramp and spasm: Secondary | ICD-10-CM

## 2024-01-16 DIAGNOSIS — M6281 Muscle weakness (generalized): Secondary | ICD-10-CM

## 2024-01-19 ENCOUNTER — Ambulatory Visit: Attending: Neurological Surgery

## 2024-01-19 DIAGNOSIS — M6281 Muscle weakness (generalized): Secondary | ICD-10-CM | POA: Diagnosis present

## 2024-01-19 DIAGNOSIS — M25551 Pain in right hip: Secondary | ICD-10-CM | POA: Diagnosis present

## 2024-01-19 DIAGNOSIS — R252 Cramp and spasm: Secondary | ICD-10-CM | POA: Diagnosis present

## 2024-01-19 DIAGNOSIS — M5459 Other low back pain: Secondary | ICD-10-CM | POA: Insufficient documentation

## 2024-01-19 DIAGNOSIS — R293 Abnormal posture: Secondary | ICD-10-CM | POA: Diagnosis present

## 2024-01-19 DIAGNOSIS — G5702 Lesion of sciatic nerve, left lower limb: Secondary | ICD-10-CM | POA: Diagnosis present

## 2024-01-19 DIAGNOSIS — R262 Difficulty in walking, not elsewhere classified: Secondary | ICD-10-CM | POA: Insufficient documentation

## 2024-01-19 DIAGNOSIS — M5417 Radiculopathy, lumbosacral region: Secondary | ICD-10-CM | POA: Diagnosis present

## 2024-01-19 NOTE — Therapy (Signed)
 OUTPATIENT PHYSICAL THERAPY LUMBAR TREATMENT   Patient Name: Isaiah Price MRN: [id number] DOB:02-Apr-1951, 73 y.o., male Today's Date: 01/19/2024  END OF SESSION:  PT End of Session - 01/19/24 1102     Visit Number 8    Date for PT Re-Evaluation 02/15/24    Authorization Type Medicare/ BCBC    Progress Note Due on Visit 10    PT Start Time 1016    PT Stop Time 1054    PT Time Calculation (min) 38 min    Activity Tolerance Patient tolerated treatment well    Behavior During Therapy WFL for tasks assessed/performed                   Past Medical History:  Diagnosis Date   Achilles tendinitis, left leg    At risk for sleep apnea    STOP--BANG SCORE= 5  (routed to pt's pcp in epic 11-13-2018)   Cervicogenic headache 06/08/2021   Chronic insomnia 12/31/2015   Chronic neck pain    Congenital pectus excavatum    per pt had pulmonary test ,  60% restrictive by states asymptomatic   DDD (degenerative disc disease), cervical    Epicondylitis, lateral, right    Gait abnormality 06/08/2021   GERD (gastroesophageal reflux disease)    History of exercise intolerance    09-02-2017  ETT,  negative for ischemia   History of pericarditis 1985   HTN (hypertension)    Hypercholesteremia 12/31/2015   Iritis    IT band syndrome 05/06/2014   Lower urinary tract symptoms (LUTS)    Neuropathy    bilateral arms with tingling and numbness at times due to cervical neck   OA (osteoarthritis)    Polyarthralgia    Prostate cancer [facility])    urologist-  dr Laverle Patter--- Stage T1c, Gleason 3+4, 06/08/21 recurrent prostate cancer   Rheumatoid arthritis [facility])    rheumatologist-- dr Dierdre Forth--- seropositive,multiple sites   Tendon disorder 2022   torn tendon, right arm   Wears glasses    Past Surgical History:  Procedure Laterality Date   ANTERIOR CERVICAL DECOMP/DISCECTOMY FUSION  1991   dr Newell Coral   C5 -- 7  (per pt fusion failed)   CARPAL TUNNEL RELEASE Right done at same time of [facility]  fusion   CARPOMETACARPAL ([facility]) FUSION OF THUMB Bilateral 2015 and 2014  approx.   CATARACT EXTRACTION W/ INTRAOCULAR LENS  IMPLANT, BILATERAL  2013   LYMPHADENECTOMY Bilateral 11/16/2018   Procedure: LYMPHADENECTOMY, PELVIC;  Surgeon: Heloise Purpura, MD;  Location: [facility];  Service: Urology;  Laterality: Bilateral;   ROBOT ASSISTED LAPAROSCOPIC RADICAL PROSTATECTOMY N/A 11/16/2018   Procedure: XI ROBOTIC ASSISTED LAPAROSCOPIC RADICAL PROSTATECTOMY LEVEL 2;  Surgeon: Heloise Purpura, MD;  Location: [facility];  Service: Urology;  Laterality: N/A;   SHOULDER SURGERY Left age 76   TONSILLECTOMY  child   Patient Active Problem List   Diagnosis Date Noted   Spinal stenosis of lumbar region 01/11/2024   Dyslipidemia 10/02/2023   Aortic root enlargement (HCC) 10/02/2023   Elevated coronary artery calcium score 10/02/2023   Sciatic pain, right 09/07/2023   Palpitations 09/27/2021   Cervicogenic headache 06/08/2021   Gait abnormality 06/08/2021   Prostate cancer (HCC) 11/16/2018   Malignant neoplasm of prostate (HCC) 09/18/2018   Daytime somnolence 08/04/2017   Dyspnea on exertion 08/04/2017   Exercise intolerance 08/04/2017   Need for influenza vaccination 07/01/2017   Ulnar neuropathy at elbow, 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   Essential hypertension 01/12/2017   Groin strain 11/18/2016  Irregular bowel habits 11/18/2016   Rheumatoid arthritis (HCC) 01/22/2016   Chronic insomnia 12/31/2015   Chronic pain 12/31/2015   Esophageal reflux 12/31/2015   Hypercholesteremia 12/31/2015   Chronic arthritis 12/31/2015   Wrist pain, right 12/01/2015   IT band syndrome 05/06/2014   Cervical radiculopathy 07/14/2011   Arthritis pain, hand 06/02/2011   Osteoarthritis of CMC joint of thumb 06/02/2011    PCP: Barbie Banner, MD  REFERRING PROVIDER: Ivor Messier, MD  REFERRING DIAG:  Diagnosis  M43.16 (ICD-10-CM) - Spondylolisthesis at L4-L5 level   THERAPY DIAG:  Muscle weakness (generalized)  Pain  in right hip  Radiculopathy, lumbosacral region  Cramp and spasm  Other low back pain  Rationale for Evaluation and Treatment: Rehabilitation  ONSET DATE: October 2024  SUBJECTIVE:   SUBJECTIVE STATEMENT I was feeling good after injections and before the fall. I haven't been doing much of the exercises due to being sore.    PERTINENT HISTORY: RA, IT band syndrome, ACDF of C5-7 in 1991, Lumbar Laminectomy L5-S1 October 2023  PAIN: 01/19/24 Are you having pain? Yes: NPRS scale: 2/10 Pain location: low back Pain description: shooting, sharp, sometimes aching and radiating down Rt LE Aggravating factors: standing and walking Relieving factors: sitting  PRECAUTIONS: None  RED FLAGS: None   WEIGHT BEARING RESTRICTIONS: No  FALLS:  Has patient fallen in last 6 months? No  LIVING ENVIRONMENT: Lives with: lives with their spouse Lives in: House/apartment Stairs:  one story home Has following equipment at home: Grab bars  OCCUPATION: retired, formerly a Designer, television/film set  PLOF: Independent and Leisure: yard work Standing: 3-5 minutes static, moving around 5-8 min   The Patient-Specific Functional Scale  Initial:  I am going to ask you to identify up to 3 important activities that you are unable to do or are having difficulty with as a result of this problem.  Today are there any activities that you are unable to do or having difficulty with because of this?  (Patient shown scale and patient rated each activity)  Follow up: When you first came in you had difficulty performing these activities.  Today do you still have difficulty?  Patient-Specific activity scoring scheme (Point to one number): PSFS  0 1 2 3 4 5 6 7 8 9  10 Unable                                                                                                          Able to perform To perform  activity at the  same Activity         Level as before                                                                                                                       Injury or problem  Activity  Unloading dishwasher                                          Initial: 5/10                      follow up:  2.   Standing for  shower                                            Initial:  3-4/10                     follow up:   PATIENT GOALS: To be able to walk/stand without hurting, avoid surgery for now.   NEXT MD VISIT:   OBJECTIVE:  Note: Objective measures were completed at Evaluation unless otherwise noted.  DIAGNOSTIC FINDINGS:  Right Hip Radiograph on 10/06/2023: FINDINGS: There is no evidence of hip fracture or dislocation. No significant arthropathy. No bony lesions identified. The bony pelvis is unremarkable.  Lumbar MRI on 11/23/23:  1. 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 2. Interval left laminectomy at L5-S1 without residual lateral recess stenosis. Unchanged severe left neural foraminal stenosis. 3. 3.2 cm abdominal aortic aneurysm. Recommend surveillance ultrasound in 3 years.  PATIENT SURVEYS:  Eval: Modified Oswestry: 19/50= 38% limitation  COGNITION: Overall cognitive status: Within functional limits for tasks assessed     SENSATION: Reports some numbness and tingling down his right leg, especially with prolonged standing.  MUSCLE LENGTH: Hamstrings: tightness noted bilat  POSTURE: rounded shoulders, forward head, and flexed trunk   PALPATION: Tenderness to palpation Rt gluteals and Lt lumbar spine  LOWER EXTREMITY ROM: Lumbar A/ROM: flexion full without pain, extension to neutral with Rt LE pain, Lt sidebending full without pain, Rt sidebending to neutral with pain  Rt hip flexibility: unable to perform figure 4 on Rt in sitting  LOWER EXTREMITY MMT:  Eval:   Right hip strength grossly 4-/5 Right hamstring strength is 4/5 Right quad  strength is 5-/5 Left LE strength is WFL   FUNCTIONAL ASSESSMENTS:  01/05/2024: 6 minute walk test:  929 ft with pain of 6/10 (patient requires standing recovery periods)  GAIT: Distance walked: >500 ft Assistive device utilized: None Level of assistance: Complete Independence Comments: Patient with antalgic gait with decreased stance time on right LE  TODAY'S TREATMENT    01/19/2024: Nustep level 1 (LE only) x 6 min with PT present to discuss status Seated hamstring stretch with leg on table x 60  sec bilat Seated piriformis stretch x 60 sec B Supine double knee to chest ([facility]) 2x15 sec Bridge with alt knee extension x 5 bilat TA+ SLR 2 x 10 B Supine 90/90 heel tap down x10 B  90/90 alt leg press x 10 B Sidelying clamshell with green tband 2x10 bilat Open book x 5 B then 5 breath hold at end Standing shoulder rows with blue tband 2x10 did in tandem stance but could only do with R leg fwd Standing shoulder extension with blue tband 2x10 Standing paloff press with blue tband x10 bilat  01/16/2024: Nustep level 1 (LE only) x 6 min with PT present to discuss status Seated hamstring stretch with leg on table x 60  sec bilat Seated piriformis stretch x 60 sec B Supine double knee to chest ([facility]) 2x15 sec Bridge with alt knee extension x 5 bilat TA+ SLR 2 x 10 B Supine 90/90 heel tap down x10 B  90/90 alt leg press x 10 B Sidelying clamshell with green tband 2x10 bilat Open book x 5 B then 5 breath hold at end Standing shoulder rows with blue tband 2x10 did in tandem stance but could only do with R leg fwd Standing shoulder extension with blue tband 2x10 Standing paloff press with blue tband x10 bilat   01/12/2024: Nustep level 5 (LE only) x 6 min with PT present to discuss status Seated hamstring stretch 2x20 sec bilat Seated piriformis stretch 2x20  sec bilat Supine double knee to chest ([facility]) 2x15 sec Supine marching with transversus abdominus contraction 2x10 Supine 90/90 heel tap down x10 Bridge with alt knee extension x 5 bilat Sidelying clamshell with green tband 2x10 bilat Sidelying reverse clamshell 2x10 bilat Standing shoulder rows with blue tband 2x10 Standing shoulder extension with blue tband 2x10 Standing paloff press with blue tband x10 bilat Standing hip abduction (glute med) iso press into wall 10 sec hold 2x5 bilat   PATIENT EDUCATION:  Education details: Research scientist (medical) Person educated: Patient Education method: Explanation, Facilities manager, and Handouts Education comprehension: verbalized understanding  HOME EXERCISE PROGRAM: Access Code: [email or web address] URL: [email or web address] Date: 01/09/2024 Prepared by: Raynelle Fanning  Exercises - Seated Scapular Retraction  - 1 x daily - 7 x weekly - 2 sets - 10 reps - Seated Hamstring Stretch  - 1 x daily - 7 x weekly - 2 reps - 20 sec hold - Clamshell  - 1 x daily - 7 x weekly - 2 sets - 10 reps - Seated Figure 4 Piriformis Stretch  - 1 x daily - 7 x weekly - 3 sets - 10 reps - Supine Figure 4 Piriformis Stretch  - 3 x daily - 7 x weekly - 1 sets - 3 reps - 20 hold - Hooklying Single Knee to Chest Stretch  - 3 x daily - 7 x weekly - 1 sets - 3 reps - 20 hold - Supine Double Knee to Chest  - 3 x daily - 7 x weekly - 1 sets - 3 reps - 20 hold - Hip Hiking on Step  - 1 x daily - 3 x weekly - 2 sets - 10 reps - Standing Isometric Hip Abduction with Knee at 90 at Wall  - 1 x daily - 3 x weekly - 2 sets - 10 reps - 5 sec hold  ASSESSMENT:  CLINICAL  IMPRESSION: Pt had been feeling much better until he had a fall last week.  He is sore and working to increase his activity. PT encouraged him to return to regular activity as able.  He tolerated all exercises without complaint. Some leg cramping with supine exercise. Gait is antalgic with reduced time spent on the Rt LE. Would  benefit from more SLS activities on the R to strengthen gluteals and improve balance.   OBJECTIVE IMPAIRMENTS: decreased balance, difficulty walking, decreased strength, increased muscle spasms, impaired flexibility, postural dysfunction, and pain.     GOALS: Goals reviewed with patient? Yes  SHORT TERM GOALS: Target date: 01/18/24 Patient will be independent with initial HEP. Baseline: Goal status: Met on 01/05/24  2.  Patient will report at least a 25% improvement in Rt LE and low back pain with standing  Baseline:  Goal status: Met on 01/05/24  3.  Stand for > or = to 5-7 minutes for self-care tasks without limitation  Baseline: 3-5 minutes  Goal status: Ongoing   LONG TERM GOALS: Target date: 02/14/2024      Patient will be independent with advanced HEP to allow for self progression after discharge. Baseline:  Goal status: Ongoing  2.  Modified Oswestry to < or = to 10/50 (20%) for improved function Baseline: 19/50 (38%) Goal status: INITIAL  3.  Improve PSFS to > or = to 7/10 for unloading the dishwasher due to reduced pain Baseline: 5/10 Goal status: INITIAL  4.  Improve PSFS to > or = to 5-6/10 for taking a shower due to reduced pain Baseline: 3-4/10 Goal status: INITIAL  5. Report > or = to 40% reduction in low back and Rt LE symptoms with standing and walking   Baseline: 7-8/10  Goal Status: INITIAL   PLAN:  PT FREQUENCY: 2x/week  PT DURATION: 8 weeks  PLANNED INTERVENTIONS: 97164- PT Re-evaluation, 97110-Therapeutic exercises, 97530- Therapeutic activity, 97112- Neuromuscular re-education, 97535- Self Care, 16109- Manual therapy, [id number]- Gait training, [id number]- Canalith repositioning, [id number]- Aquatic Therapy, 97014- Electrical stimulation (unattended), [id number]- Electrical stimulation (manual), 97016- Vasopneumatic device, [id number]- Ultrasound, [id number]- Traction (mechanical), Patient/Family education, Balance training, Stair training, Taping, Dry Needling, Joint  mobilization, Joint manipulation, Spinal manipulation, Spinal mobilization, Cryotherapy, Moist heat, and Biofeedback  PLAN FOR NEXT SESSION: Resume more standing exercises as pain allows, flexion based exercise, LE strength in sitting or supine/side lying as tolerated, Glute med strength, SLS  Lorrene Reid, PT 01/19/24 11:06 AM   [facility] [address], [address] [place], [place] [address] Phone # [phone] Fax [phone]

## 2024-01-23 ENCOUNTER — Encounter: Payer: Self-pay | Admitting: Rehabilitative and Restorative Service Providers"

## 2024-01-23 ENCOUNTER — Ambulatory Visit: Admitting: Rehabilitative and Restorative Service Providers"

## 2024-01-23 DIAGNOSIS — M5417 Radiculopathy, lumbosacral region: Secondary | ICD-10-CM

## 2024-01-23 DIAGNOSIS — R252 Cramp and spasm: Secondary | ICD-10-CM

## 2024-01-23 DIAGNOSIS — M5459 Other low back pain: Secondary | ICD-10-CM

## 2024-01-23 DIAGNOSIS — M6281 Muscle weakness (generalized): Secondary | ICD-10-CM

## 2024-01-23 DIAGNOSIS — M25551 Pain in right hip: Secondary | ICD-10-CM

## 2024-01-23 NOTE — Therapy (Signed)
 OUTPATIENT PHYSICAL THERAPY LUMBAR TREATMENT   Patient Name: Isaiah Price MRN: [id number] DOB:08-28-1951, 73 y.o., male Today's Date: 01/23/2024  END OF SESSION:  PT End of Session - 01/23/24 1151     Visit Number 9    Date for PT Re-Evaluation 02/15/24    Authorization Type Medicare/ BCBC    Progress Note Due on Visit 10    PT Start Time 1147    PT Stop Time 1225    PT Time Calculation (min) 38 min    Activity Tolerance Patient tolerated treatment well    Behavior During Therapy WFL for tasks assessed/performed                   Past Medical History:  Diagnosis Date   Achilles tendinitis, left leg    At risk for sleep apnea    STOP--BANG SCORE= 5  (routed to pt's pcp in epic 11-13-2018)   Cervicogenic headache 06/08/2021   Chronic insomnia 12/31/2015   Chronic neck pain    Congenital pectus excavatum    per pt had pulmonary test ,  60% restrictive by states asymptomatic   DDD (degenerative disc disease), cervical    Epicondylitis, lateral, right    Gait abnormality 06/08/2021   GERD (gastroesophageal reflux disease)    History of exercise intolerance    09-02-2017  ETT,  negative for ischemia   History of pericarditis 1985   HTN (hypertension)    Hypercholesteremia 12/31/2015   Iritis    IT band syndrome 05/06/2014   Lower urinary tract symptoms (LUTS)    Neuropathy    bilateral arms with tingling and numbness at times due to cervical neck   OA (osteoarthritis)    Polyarthralgia    Prostate cancer [facility])    urologist-  dr Laverle Patter--- Stage T1c, Gleason 3+4, 06/08/21 recurrent prostate cancer   Rheumatoid arthritis [facility])    rheumatologist-- dr Dierdre Forth--- seropositive,multiple sites   Tendon disorder 2022   torn tendon, right arm   Wears glasses    Past Surgical History:  Procedure Laterality Date   ANTERIOR CERVICAL DECOMP/DISCECTOMY FUSION  1991   dr Newell Coral   C5 -- 7  (per pt fusion failed)   CARPAL TUNNEL RELEASE Right done at same time of [facility]  fusion   CARPOMETACARPAL ([facility]) FUSION OF THUMB Bilateral 2015 and 2014  approx.   CATARACT EXTRACTION W/ INTRAOCULAR LENS  IMPLANT, BILATERAL  2013   LYMPHADENECTOMY Bilateral 11/16/2018   Procedure: LYMPHADENECTOMY, PELVIC;  Surgeon: Heloise Purpura, MD;  Location: [facility];  Service: Urology;  Laterality: Bilateral;   ROBOT ASSISTED LAPAROSCOPIC RADICAL PROSTATECTOMY N/A 11/16/2018   Procedure: XI ROBOTIC ASSISTED LAPAROSCOPIC RADICAL PROSTATECTOMY LEVEL 2;  Surgeon: Heloise Purpura, MD;  Location: [facility];  Service: Urology;  Laterality: N/A;   SHOULDER SURGERY Left age 48   TONSILLECTOMY  child   Patient Active Problem List   Diagnosis Date Noted   Spinal stenosis of lumbar region 01/11/2024   Dyslipidemia 10/02/2023   Aortic root enlargement (HCC) 10/02/2023   Elevated coronary artery calcium score 10/02/2023   Sciatic pain, right 09/07/2023   Palpitations 09/27/2021   Cervicogenic headache 06/08/2021   Gait abnormality 06/08/2021   Prostate cancer (HCC) 11/16/2018   Malignant neoplasm of prostate (HCC) 09/18/2018   Daytime somnolence 08/04/2017   Dyspnea on exertion 08/04/2017   Exercise intolerance 08/04/2017   Need for influenza vaccination 07/01/2017   Ulnar neuropathy at elbow, 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   Essential hypertension 01/12/2017   Groin strain 11/18/2016  Irregular bowel habits 11/18/2016   Rheumatoid arthritis (HCC) 01/22/2016   Chronic insomnia 12/31/2015   Chronic pain 12/31/2015   Esophageal reflux 12/31/2015   Hypercholesteremia 12/31/2015   Chronic arthritis 12/31/2015   Wrist pain, right 12/01/2015   IT band syndrome 05/06/2014   Cervical radiculopathy 07/14/2011   Arthritis pain, hand 06/02/2011   Osteoarthritis of CMC joint of thumb 06/02/2011    PCP: Barbie Banner, MD  REFERRING PROVIDER: Ivor Messier, MD  REFERRING DIAG:  Diagnosis  M43.16 (ICD-10-CM) - Spondylolisthesis at L4-L5 level   THERAPY DIAG:  Muscle weakness (generalized)  Pain  in right hip  Radiculopathy, lumbosacral region  Cramp and spasm  Other low back pain  Rationale for Evaluation and Treatment: Rehabilitation  ONSET DATE: October 2024  SUBJECTIVE:   SUBJECTIVE STATEMENT Patient reports that he is still having increased pain after his recent fall in the yard.  "I hope that I didn't mess anything up."  PERTINENT HISTORY: RA, IT band syndrome, ACDF of C5-7 in 1991, Lumbar Laminectomy L5-S1 October 2023  PAIN: 01/19/24 Are you having pain? Yes: NPRS scale: 4-5/10 Pain location: low back Pain description: shooting, sharp, sometimes aching and radiating down Rt LE Aggravating factors: standing and walking Relieving factors: sitting  PRECAUTIONS: None  RED FLAGS: None   WEIGHT BEARING RESTRICTIONS: No  FALLS:  Has patient fallen in last 6 months? No  LIVING ENVIRONMENT: Lives with: lives with their spouse Lives in: House/apartment Stairs:  one story home Has following equipment at home: Grab bars  OCCUPATION: retired, formerly a Designer, television/film set  PLOF: Independent and Leisure: yard work Standing: 3-5 minutes static, moving around 5-8 min   The Patient-Specific Functional Scale  Initial:  I am going to ask you to identify up to 3 important activities that you are unable to do or are having difficulty with as a result of this problem.  Today are there any activities that you are unable to do or having difficulty with because of this?  (Patient shown scale and patient rated each activity)  Follow up: When you first came in you had difficulty performing these activities.  Today do you still have difficulty?  Patient-Specific activity scoring scheme (Point to one number): PSFS  0 1 2 3 4 5 6 7 8 9  10 Unable                                                                                                          Able to perform To perform  activity at the same Activity         Level as before                                                                                                                       Injury or problem  Activity  Unloading dishwasher                                          Initial: 5/10                      follow up:  2.   Standing for  shower                                            Initial:  3-4/10                     follow up:   PATIENT GOALS: To be able to walk/stand without hurting, avoid surgery for now.   NEXT MD VISIT:   OBJECTIVE:  Note: Objective measures were completed at Evaluation unless otherwise noted.  DIAGNOSTIC FINDINGS:  Right Hip Radiograph on 10/06/2023: FINDINGS: There is no evidence of hip fracture or dislocation. No significant arthropathy. No bony lesions identified. The bony pelvis is unremarkable.  Lumbar MRI on 11/23/23:  1. 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 2. Interval left laminectomy at L5-S1 without residual lateral recess stenosis. Unchanged severe left neural foraminal stenosis. 3. 3.2 cm abdominal aortic aneurysm. Recommend surveillance ultrasound in 3 years.  PATIENT SURVEYS:  Eval: Modified Oswestry: 19/50= 38% limitation  COGNITION: Overall cognitive status: Within functional limits for tasks assessed     SENSATION: Reports some numbness and tingling down his right leg, especially with prolonged standing.  MUSCLE LENGTH: Hamstrings: tightness noted bilat  POSTURE: rounded shoulders, forward head, and flexed trunk   PALPATION: Tenderness to palpation Rt gluteals and Lt lumbar spine  LOWER EXTREMITY ROM: Lumbar A/ROM: flexion full without pain, extension to neutral with Rt LE pain, Lt sidebending full without pain, Rt sidebending to neutral with pain  Rt hip flexibility: unable to perform figure 4 on Rt in sitting  LOWER EXTREMITY MMT:  Eval:   Right hip strength grossly 4-/5 Right hamstring strength is  4/5 Right quad strength is 5-/5 Left LE strength is WFL   FUNCTIONAL ASSESSMENTS:  01/05/2024: 6 minute walk test:  929 ft with pain of 6/10 (patient requires standing recovery periods)  GAIT: Distance walked: >500 ft Assistive device utilized: None Level of assistance: Complete Independence Comments: Patient with antalgic gait with decreased stance time on right LE  TODAY'S TREATMENT   01/23/2024: Recumbent bike level 3 x6 min with PT present to discuss status Standing shoulder rows with blue tband 2x10 Standing shoulder extension with blue tband 2x10 Standing paloff press with blue tband x20 bilat Supine hamstring stretch with strap 2x20 sec bilat Supine IT band stretch with strap 2x20 sec bilat Supine hip adduction stretch with strap 2x20 sec bilat Supine marching with transversus abdominus contraction 2x10 Supine 90/90 heel tap down x10 Supine 90/90 with leg extension x10 bilat Bridge with alt knee extension x 10 bilat Supine straight leg raise with transversus abdominus contraction x10 bilat Supine piriformis stretch x30 sec bilat Prone hip extension 2x10 bilat Open book x 5 B then 5 breath hold at end   01/19/2024: Nustep level 1 (LE only) x 6 min with PT present to discuss status Seated hamstring stretch with leg on table x 60  sec bilat Seated piriformis stretch x 60 sec B Supine double knee to chest ([facility]) 2x15 sec Bridge with alt knee extension x 5 bilat TA+ SLR 2 x 10 B Supine 90/90 heel tap down x10 B  90/90 alt leg press x 10 B Sidelying clamshell with green tband 2x10 bilat Open book x 5 B then 5 breath hold at end Standing shoulder rows with blue tband 2x10 did in tandem stance but could only do with R leg fwd Standing shoulder extension with blue tband 2x10 Standing paloff press with blue tband x10 bilat  01/16/2024: Nustep level  1 (LE only) x 6 min with PT present to discuss status Seated hamstring stretch with leg on table x 60  sec bilat Seated piriformis stretch x 60 sec B Supine double knee to chest ([facility]) 2x15 sec Bridge with alt knee extension x 5 bilat TA+ SLR 2 x 10 B Supine 90/90 heel tap down x10 B  90/90 alt leg press x 10 B Sidelying clamshell with green tband 2x10 bilat Open book x 5 B then 5 breath hold at end Standing shoulder rows with blue tband 2x10 did in tandem stance but could only do with R leg fwd Standing shoulder extension with blue tband 2x10 Standing paloff press with blue tband x10 bilat    PATIENT EDUCATION:  Education details: Research scientist (medical) Person educated: Patient Education method: Explanation, Facilities manager, and Handouts Education comprehension: verbalized understanding  HOME EXERCISE PROGRAM: Access Code: [email or web address] URL: [email or web address] Date: 01/09/2024 Prepared by: Raynelle Fanning  Exercises - Seated Scapular Retraction  - 1 x daily - 7 x weekly - 2 sets - 10 reps - Seated Hamstring Stretch  - 1 x daily - 7 x weekly - 2 reps - 20 sec hold - Clamshell  - 1 x daily - 7 x weekly - 2 sets - 10 reps - Seated Figure 4 Piriformis Stretch  - 1 x daily - 7 x weekly - 3 sets - 10 reps - Supine Figure 4 Piriformis Stretch  - 3 x daily - 7 x weekly - 1 sets - 3 reps - 20 hold - Hooklying Single Knee to Chest Stretch  - 3 x daily - 7 x weekly - 1 sets - 3 reps - 20 hold - Supine Double Knee to Chest  - 3 x daily - 7 x weekly - 1 sets - 3 reps - 20 hold - Hip Hiking on Step  - 1 x daily - 3 x weekly - 2 sets - 10 reps - Standing Isometric Hip Abduction with Knee at 90 at Wall  - 1 x daily -  3 x weekly - 2 sets - 10 reps - 5 sec hold  ASSESSMENT:  CLINICAL IMPRESSION: Mr Kumpf presents to skilled PT reporting that he continues to have increased pain since his fall in the yard.  Patient reports some tightness, so focus of session today was on stretching and core stabilization.   Performed hip extension secondary to weakness of glute max.  Patient continues to require skilled PT to progress towards goal related activities.  OBJECTIVE IMPAIRMENTS: decreased balance, difficulty walking, decreased strength, increased muscle spasms, impaired flexibility, postural dysfunction, and pain.     GOALS: Goals reviewed with patient? Yes  SHORT TERM GOALS: Target date: 01/18/24 Patient will be independent with initial HEP. Baseline: Goal status: Met on 01/05/24  2.  Patient will report at least a 25% improvement in Rt LE and low back pain with standing  Baseline:  Goal status: Met on 01/05/24  3.  Stand for > or = to 5-7 minutes for self-care tasks without limitation  Baseline: 3-5 minutes  Goal status: Ongoing   LONG TERM GOALS: Target date: 02/14/2024      Patient will be independent with advanced HEP to allow for self progression after discharge. Baseline:  Goal status: Ongoing  2.  Modified Oswestry to < or = to 10/50 (20%) for improved function Baseline: 19/50 (38%) Goal status: INITIAL  3.  Improve PSFS to > or = to 7/10 for unloading the dishwasher due to reduced pain Baseline: 5/10 Goal status: INITIAL  4.  Improve PSFS to > or = to 5-6/10 for taking a shower due to reduced pain Baseline: 3-4/10 Goal status: INITIAL  5. Report > or = to 40% reduction in low back and Rt LE symptoms with standing and walking   Baseline: 7-8/10  Goal Status: INITIAL   PLAN:  PT FREQUENCY: 2x/week  PT DURATION: 8 weeks  PLANNED INTERVENTIONS: 97164- PT Re-evaluation, 97110-Therapeutic exercises, 97530- Therapeutic activity, 97112- Neuromuscular re-education, 97535- Self Care, 16109- Manual therapy, [id number]- Gait training, [phone]- Canalith repositioning, [id number]- Aquatic Therapy, 97014- Electrical stimulation (unattended), [id number]- Electrical stimulation (manual), 97016- Vasopneumatic device, [id number]- Ultrasound, [id number]- Traction (mechanical), Patient/Family education, Balance  training, Stair training, Taping, Dry Needling, Joint mobilization, Joint manipulation, Spinal manipulation, Spinal mobilization, Cryotherapy, Moist heat, and Biofeedback  PLAN FOR NEXT SESSION: Resume more standing exercises as pain allows, flexion based exercise, LE strength in sitting or supine/side lying as tolerated, Glute med strength, SLS    Reather Laurence, PT, DPT 01/23/24, 1:19 PM  [facility] [address] [place], [place] [address] Phone # [phone] Fax [phone]

## 2024-01-26 ENCOUNTER — Ambulatory Visit: Admitting: Rehabilitative and Restorative Service Providers"

## 2024-01-26 ENCOUNTER — Encounter: Payer: Self-pay | Admitting: Rehabilitative and Restorative Service Providers"

## 2024-01-26 DIAGNOSIS — M5459 Other low back pain: Secondary | ICD-10-CM

## 2024-01-26 DIAGNOSIS — M6281 Muscle weakness (generalized): Secondary | ICD-10-CM | POA: Diagnosis not present

## 2024-01-26 DIAGNOSIS — M25551 Pain in right hip: Secondary | ICD-10-CM

## 2024-01-26 DIAGNOSIS — M5417 Radiculopathy, lumbosacral region: Secondary | ICD-10-CM

## 2024-01-26 DIAGNOSIS — R252 Cramp and spasm: Secondary | ICD-10-CM

## 2024-01-26 NOTE — Therapy (Signed)
 OUTPATIENT PHYSICAL THERAPY LUMBAR TREATMENT   Patient Name: Isaiah Price MRN: [id number] DOB:04/15/1951, 73 y.o., male Today's Date: 01/26/2024    Progress Note Reporting Period 12/21/2023 to 01/26/2024  See note below for Objective Data and Assessment of Progress/Goals.     END OF SESSION:  PT End of Session - 01/26/24 1229     Visit Number 10    Date for PT Re-Evaluation 02/15/24    Authorization Type Medicare/ BCBC    Progress Note Due on Visit 20    PT Start Time 1227    PT Stop Time 1305    PT Time Calculation (min) 38 min    Activity Tolerance Patient tolerated treatment well    Behavior During Therapy WFL for tasks assessed/performed               Past Medical History:  Diagnosis Date   Achilles tendinitis, left leg    At risk for sleep apnea    STOP--BANG SCORE= 5  (routed to pt's pcp in epic 11-13-2018)   Cervicogenic headache 06/08/2021   Chronic insomnia 12/31/2015   Chronic neck pain    Congenital pectus excavatum    per pt had pulmonary test ,  60% restrictive by states asymptomatic   DDD (degenerative disc disease), cervical    Epicondylitis, lateral, right    Gait abnormality 06/08/2021   GERD (gastroesophageal reflux disease)    History of exercise intolerance    09-02-2017  ETT,  negative for ischemia   History of pericarditis 1985   HTN (hypertension)    Hypercholesteremia 12/31/2015   Iritis    IT band syndrome 05/06/2014   Lower urinary tract symptoms (LUTS)    Neuropathy    bilateral arms with tingling and numbness at times due to cervical neck   OA (osteoarthritis)    Polyarthralgia    Prostate cancer [facility])    urologist-  dr Laverle Patter--- Stage T1c, Gleason 3+4, 06/08/21 recurrent prostate cancer   Rheumatoid arthritis [facility])    rheumatologist-- dr Dierdre Forth--- seropositive,multiple sites   Tendon disorder 2022   torn tendon, right arm   Wears glasses    Past Surgical History:  Procedure Laterality Date   ANTERIOR CERVICAL  DECOMP/DISCECTOMY FUSION  1991   dr Newell Coral   C5 -- 7  (per pt fusion failed)   CARPAL TUNNEL RELEASE Right done at same time of CMC fusion   CARPOMETACARPAL (CMC) FUSION OF THUMB Bilateral 2015 and 2014  approx.   CATARACT EXTRACTION W/ INTRAOCULAR LENS  IMPLANT, BILATERAL  2013   LYMPHADENECTOMY Bilateral 11/16/2018   Procedure: LYMPHADENECTOMY, PELVIC;  Surgeon: Heloise Purpura, MD;  Location: [facility];  Service: Urology;  Laterality: Bilateral;   ROBOT ASSISTED LAPAROSCOPIC RADICAL PROSTATECTOMY N/A 11/16/2018   Procedure: XI ROBOTIC ASSISTED LAPAROSCOPIC RADICAL PROSTATECTOMY LEVEL 2;  Surgeon: Heloise Purpura, MD;  Location: [facility];  Service: Urology;  Laterality: N/A;   SHOULDER SURGERY Left age 32   TONSILLECTOMY  child   Patient Active Problem List   Diagnosis Date Noted   Spinal stenosis of lumbar region 01/11/2024   Dyslipidemia 10/02/2023   Aortic root enlargement (HCC) 10/02/2023   Elevated coronary artery calcium score 10/02/2023   Sciatic pain, right 09/07/2023   Palpitations 09/27/2021   Cervicogenic headache 06/08/2021   Gait abnormality 06/08/2021   Prostate cancer (HCC) 11/16/2018   Malignant neoplasm of prostate (HCC) 09/18/2018   Daytime somnolence 08/04/2017   Dyspnea on exertion 08/04/2017   Exercise intolerance 08/04/2017   Need for influenza  vaccination 07/01/2017   Ulnar neuropathy at elbow, 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   Essential hypertension 01/12/2017   Groin strain 11/18/2016   Irregular bowel habits 11/18/2016   Rheumatoid arthritis (HCC) 01/22/2016   Chronic insomnia 12/31/2015   Chronic pain 12/31/2015   Esophageal reflux 12/31/2015   Hypercholesteremia 12/31/2015   Chronic arthritis 12/31/2015   Wrist pain, right 12/01/2015   IT band syndrome 05/06/2014   Cervical radiculopathy 07/14/2011   Arthritis pain, hand 06/02/2011   Osteoarthritis of CMC joint of thumb 06/02/2011    PCP: Barbie Banner, MD  REFERRING PROVIDER: Ivor Messier,  MD  REFERRING DIAG:  Diagnosis  M43.16 (ICD-10-CM) - Spondylolisthesis at L4-L5 level   THERAPY DIAG:  Muscle weakness (generalized)  Pain in right hip  Radiculopathy, lumbosacral region  Cramp and spasm  Other low back pain  Rationale for Evaluation and Treatment: Rehabilitation  ONSET DATE: October 2024  SUBJECTIVE:   SUBJECTIVE STATEMENT Patient states that he is still frustrated with his progress since his fall.  Denies pain when sitting.  Patient does state that he has noted his posture is improving.  PERTINENT HISTORY: RA, IT band syndrome, ACDF of C5-7 in 1991, Lumbar Laminectomy L5-S1 October 2023  PAIN: 01/19/24 Are you having pain? Yes: NPRS scale: 0-5/10 Pain location: low back Pain description: shooting, sharp, sometimes aching and radiating down Rt LE Aggravating factors: standing and walking Relieving factors: sitting  PRECAUTIONS: None  RED FLAGS: None   WEIGHT BEARING RESTRICTIONS: No  FALLS:  Has patient fallen in last 6 months? No  LIVING ENVIRONMENT: Lives with: lives with their spouse Lives in: House/apartment Stairs:  one story home Has following equipment at home: Grab bars  OCCUPATION: retired, formerly a Designer, television/film set  PLOF: Independent and Leisure: yard work Standing: 3-5 minutes static, moving around 5-8 min   The Patient-Specific Functional Scale  Initial:  I am going to ask you to identify up to 3 important activities that you are unable to do or are having difficulty with as a result of this problem.  Today are there any activities that you are unable to do or having difficulty with because of this?  (Patient shown scale and patient rated each activity)  Follow up: When you first came in you had difficulty performing these activities.  Today do you still have difficulty?  Patient-Specific activity scoring scheme (Point to one number): PSFS  0 1 2 3 4 5 6 7 8 9  10 Unable                                                                                                           Able to perform To perform  activity at the same Activity         Level as before                                                                                                                       Injury or problem  Activity  Unloading dishwasher Initial: 5/10   follow up on 01/26/24: 6-7/10  2.   Standing for  shower  Initial:  3-4/10                     follow up on 01/26/24:  7/10   PATIENT GOALS: To be able to walk/stand without hurting, avoid surgery for now.   NEXT MD VISIT: Pt returns to Dr Webb Silversmith on 02/01/24  OBJECTIVE:  Note: Objective measures were completed at Evaluation unless otherwise noted.  DIAGNOSTIC FINDINGS:  Right Hip Radiograph on 10/06/2023: FINDINGS: There is no evidence of hip fracture or dislocation. No significant arthropathy. No bony lesions identified. The bony pelvis is unremarkable.  Lumbar MRI on 11/23/23:  1. 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 2. Interval left laminectomy at L5-S1 without residual lateral recess stenosis. Unchanged severe left neural foraminal stenosis. 3. 3.2 cm abdominal aortic aneurysm. Recommend surveillance ultrasound in 3 years.  PATIENT SURVEYS:  Eval: Modified Oswestry: 19/50= 38% limitation  01/26/2024:  Modified Oswestry Low Back Pain Disability Questionnaire: 18 / 50 = 36.0 %  COGNITION: Overall cognitive status: Within functional limits for tasks assessed     SENSATION: Reports some numbness and tingling down his right leg, especially with prolonged standing.  MUSCLE LENGTH: Hamstrings: tightness noted bilat  POSTURE: rounded shoulders, forward head, and flexed trunk   PALPATION: Tenderness to palpation Rt gluteals and Lt lumbar spine  LOWER EXTREMITY ROM: Lumbar A/ROM: flexion full without pain, extension to neutral with Rt LE  pain, Lt sidebending full without pain, Rt sidebending to neutral with pain  Rt hip flexibility: unable to perform figure 4 on Rt in sitting  LOWER EXTREMITY MMT:  Eval:   Right hip strength grossly 4-/5 Right hamstring strength is 4/5 Right quad strength is 5-/5 Left LE strength is WFL   FUNCTIONAL ASSESSMENTS:  01/05/2024: 6 minute walk test:  929 ft with pain of 6/10 (patient requires standing recovery periods)  GAIT: Distance walked: >500 ft Assistive device utilized: None Level of assistance: Complete Independence Comments: Patient with antalgic gait with decreased stance time on right LE  TODAY'S TREATMENT   01/26/2024: Recumbent bike level 3 x6 min with PT present to discuss status Modified Oswestry and PSFS Standing shoulder rows with blue tband 2x10 Standing shoulder extension with blue tband 2x10 Standing paloff press with blue tband x20 bilat Hip Matrix with 25#:  hip abduction and hip extension.  2x10 each bilat Supine hamstring stretch with strap 2x20 sec bilat Supine IT band stretch with strap 2x20 sec bilat Supine marching with transversus abdominus contraction 2x10   01/23/2024: Recumbent bike level 3 x6 min with PT present to discuss status Standing shoulder rows with blue tband 2x10 Standing shoulder extension with blue tband 2x10 Standing paloff press with blue tband x20 bilat Supine hamstring stretch with strap 2x20 sec bilat Supine IT band stretch with strap 2x20 sec bilat Supine hip adduction stretch with strap 2x20 sec bilat Supine marching with transversus abdominus contraction 2x10 Supine 90/90 heel tap down x10 Supine 90/90 with leg extension x10 bilat Bridge with alt knee extension x 10 bilat Supine straight leg raise with transversus abdominus contraction x10 bilat Supine piriformis stretch x30 sec bilat Prone hip  extension 2x10 bilat Open book x 5 B then 5 breath hold at end   01/19/2024: Nustep level 1 (LE only) x 6 min with PT present to discuss status Seated hamstring stretch with leg on table x 60  sec bilat Seated piriformis stretch x 60 sec B Supine double knee to chest ([facility]) 2x15 sec Bridge with alt knee extension x 5 bilat TA+ SLR 2 x 10 B Supine 90/90 heel tap down x10 B  90/90 alt leg press x 10 B Sidelying clamshell with green tband 2x10 bilat Open book x 5 B then 5 breath hold at end Standing shoulder rows with blue tband 2x10 did in tandem stance but could only do with R leg fwd Standing shoulder extension with blue tband 2x10 Standing paloff press with blue tband x10 bilat   PATIENT EDUCATION:  Education details: Research scientist (medical) Person educated: Patient Education method: Explanation, Facilities manager, and Handouts Education comprehension: verbalized understanding  HOME EXERCISE PROGRAM: Access Code: [email or web address] URL: [email or web address] Date: 01/09/2024 Prepared by: Raynelle Fanning  Exercises - Seated Scapular Retraction  - 1 x daily - 7 x weekly - 2 sets - 10 reps - Seated Hamstring Stretch  - 1 x daily - 7 x weekly - 2 reps - 20 sec hold - Clamshell  - 1 x daily - 7 x weekly - 2 sets - 10 reps - Seated Figure 4 Piriformis Stretch  - 1 x daily - 7 x weekly - 3 sets - 10 reps - Supine Figure 4 Piriformis Stretch  - 3 x daily - 7 x weekly - 1 sets - 3 reps - 20 hold - Hooklying Single Knee to Chest Stretch  - 3 x daily - 7 x weekly - 1 sets - 3 reps - 20 hold - Supine Double Knee to Chest  - 3 x daily - 7 x weekly - 1 sets - 3 reps - 20 hold - Hip Hiking on Step  - 1 x daily - 3 x weekly - 2 sets - 10 reps - Standing Isometric Hip Abduction with Knee at 90 at Wall  - 1 x daily - 3 x weekly - 2 sets - 10 reps - 5 sec hold  ASSESSMENT:  CLINICAL IMPRESSION: Mr Lunden presents to skilled PT reporting that he had some soreness after last session, but that he does feel that his  posture is improving.  Patient  with improved score on PSFS and has met that goal.  Additionally, patient with improved score on modified Oswestry and is progressing towards that goal.  Patient has met all short term goals at this time.  He states the difficulty continues to be increased ambulation.  Patient able to initiate hip matrix strengthening machine today.  Patient continues to require skilled PT to progress towards goal related activities.  OBJECTIVE IMPAIRMENTS: decreased balance, difficulty walking, decreased strength, increased muscle spasms, impaired flexibility, postural dysfunction, and pain.     GOALS: Goals reviewed with patient? Yes  SHORT TERM GOALS: Target date: 01/18/24 Patient will be independent with initial HEP. Baseline: Goal status: Met on 01/05/24  2.  Patient will report at least a 25% improvement in Rt LE and low back pain with standing  Baseline:  Goal status: Met on 01/05/24  3.  Stand for > or = to 5-7 minutes for self-care tasks without limitation  Baseline: 3-5 minutes  Goal status: Met on 01/26/24   LONG TERM GOALS: Target date: 02/14/2024   Patient will be independent with advanced HEP to allow for self progression after discharge. Baseline:  Goal status: Ongoing  2.  Modified Oswestry to < or = to 10/50 (20%) for improved function Baseline: 19/50 (38%) Goal status: Ongoing (see above)  3.  Improve PSFS to > or = to 7/10 for unloading the dishwasher due to reduced pain Baseline: 5/10 Goal status: Met on 01/26/24  4.  Improve PSFS to > or = to 5-6/10 for taking a shower due to reduced pain Baseline: 3-4/10 Goal status: Met on 01/26/24  5. Report > or = to 40% reduction in low back and Rt LE symptoms with standing and walking   Baseline: 7-8/10  Goal Status: Ongoing   PLAN:  PT FREQUENCY: 2x/week  PT DURATION: 8 weeks  PLANNED INTERVENTIONS: 97164- PT Re-evaluation, 97110-Therapeutic exercises, 97530- Therapeutic activity, 97112-  Neuromuscular re-education, 97535- Self Care, 19147- Manual therapy, [id number]- Gait training, [phone]- Canalith repositioning, [id number]- Aquatic Therapy, 97014- Electrical stimulation (unattended), [id number]- Electrical stimulation (manual), 97016- Vasopneumatic device, [id number]- Ultrasound, [id number]- Traction (mechanical), Patient/Family education, Balance training, Stair training, Taping, Dry Needling, Joint mobilization, Joint manipulation, Spinal manipulation, Spinal mobilization, Cryotherapy, Moist heat, and Biofeedback  PLAN FOR NEXT SESSION: Resume more standing exercises as pain allows, flexion based exercise, LE strength in sitting or supine/side lying as tolerated, Glute med strength, SLS    Reather Laurence, PT, DPT 01/26/24, 1:17 PM  [facility] [address] [place], [place] [address] Phone # [phone] Fax [phone]

## 2024-01-30 ENCOUNTER — Encounter: Payer: Self-pay | Admitting: Rehabilitative and Restorative Service Providers"

## 2024-01-30 ENCOUNTER — Ambulatory Visit: Admitting: Rehabilitative and Restorative Service Providers"

## 2024-01-30 DIAGNOSIS — M5417 Radiculopathy, lumbosacral region: Secondary | ICD-10-CM

## 2024-01-30 DIAGNOSIS — M6281 Muscle weakness (generalized): Secondary | ICD-10-CM | POA: Diagnosis not present

## 2024-01-30 DIAGNOSIS — M25551 Pain in right hip: Secondary | ICD-10-CM

## 2024-01-30 DIAGNOSIS — M5459 Other low back pain: Secondary | ICD-10-CM

## 2024-01-30 DIAGNOSIS — R252 Cramp and spasm: Secondary | ICD-10-CM

## 2024-01-30 NOTE — Therapy (Signed)
 OUTPATIENT PHYSICAL THERAPY LUMBAR TREATMENT   Patient Name: Isaiah Price MRN: [id number] DOB:07-13-1951, 73 y.o., male Today's Date: 01/30/2024    Progress Note Reporting Period 12/21/2023 to 01/26/2024  See note below for Objective Data and Assessment of Progress/Goals.     END OF SESSION:  PT End of Session - 01/30/24 1154     Visit Number 11    Date for PT Re-Evaluation 02/15/24    Authorization Type Medicare/ BCBC- KX Modifier Needed    Progress Note Due on Visit 20    PT Start Time 1144    PT Stop Time 1225    PT Time Calculation (min) 41 min    Activity Tolerance Patient tolerated treatment well    Behavior During Therapy WFL for tasks assessed/performed               Past Medical History:  Diagnosis Date   Achilles tendinitis, left leg    At risk for sleep apnea    STOP--BANG SCORE= 5  (routed to pt's pcp in epic 11-13-2018)   Cervicogenic headache 06/08/2021   Chronic insomnia 12/31/2015   Chronic neck pain    Congenital pectus excavatum    per pt had pulmonary test ,  60% restrictive by states asymptomatic   DDD (degenerative disc disease), cervical    Epicondylitis, lateral, right    Gait abnormality 06/08/2021   GERD (gastroesophageal reflux disease)    History of exercise intolerance    09-02-2017  ETT,  negative for ischemia   History of pericarditis 1985   HTN (hypertension)    Hypercholesteremia 12/31/2015   Iritis    IT band syndrome 05/06/2014   Lower urinary tract symptoms (LUTS)    Neuropathy    bilateral arms with tingling and numbness at times due to cervical neck   OA (osteoarthritis)    Polyarthralgia    Prostate cancer [facility])    urologist-  dr Laverle Patter--- Stage T1c, Gleason 3+4, 06/08/21 recurrent prostate cancer   Rheumatoid arthritis [facility])    rheumatologist-- dr Dierdre Forth--- seropositive,multiple sites   Tendon disorder 2022   torn tendon, right arm   Wears glasses    Past Surgical History:  Procedure Laterality Date    ANTERIOR CERVICAL DECOMP/DISCECTOMY FUSION  1991   dr Newell Coral   C5 -- 7  (per pt fusion failed)   CARPAL TUNNEL RELEASE Right done at same time of CMC fusion   CARPOMETACARPAL (CMC) FUSION OF THUMB Bilateral 2015 and 2014  approx.   CATARACT EXTRACTION W/ INTRAOCULAR LENS  IMPLANT, BILATERAL  2013   LYMPHADENECTOMY Bilateral 11/16/2018   Procedure: LYMPHADENECTOMY, PELVIC;  Surgeon: Heloise Purpura, MD;  Location: [facility];  Service: Urology;  Laterality: Bilateral;   ROBOT ASSISTED LAPAROSCOPIC RADICAL PROSTATECTOMY N/A 11/16/2018   Procedure: XI ROBOTIC ASSISTED LAPAROSCOPIC RADICAL PROSTATECTOMY LEVEL 2;  Surgeon: Heloise Purpura, MD;  Location: [facility];  Service: Urology;  Laterality: N/A;   SHOULDER SURGERY Left age 35   TONSILLECTOMY  child   Patient Active Problem List   Diagnosis Date Noted   Spinal stenosis of lumbar region 01/11/2024   Dyslipidemia 10/02/2023   Aortic root enlargement (HCC) 10/02/2023   Elevated coronary artery calcium score 10/02/2023   Sciatic pain, right 09/07/2023   Palpitations 09/27/2021   Cervicogenic headache 06/08/2021   Gait abnormality 06/08/2021   Prostate cancer (HCC) 11/16/2018   Malignant neoplasm of prostate (HCC) 09/18/2018   Daytime somnolence 08/04/2017   Dyspnea on exertion 08/04/2017   Exercise intolerance 08/04/2017  Need for influenza vaccination 07/01/2017   Ulnar neuropathy at elbow, 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   Essential hypertension 01/12/2017   Groin strain 11/18/2016   Irregular bowel habits 11/18/2016   Rheumatoid arthritis (HCC) 01/22/2016   Chronic insomnia 12/31/2015   Chronic pain 12/31/2015   Esophageal reflux 12/31/2015   Hypercholesteremia 12/31/2015   Chronic arthritis 12/31/2015   Wrist pain, right 12/01/2015   IT band syndrome 05/06/2014   Cervical radiculopathy 07/14/2011   Arthritis pain, hand 06/02/2011   Osteoarthritis of CMC joint of thumb 06/02/2011    PCP: Tura Gaines, MD  REFERRING PROVIDER: Claybon Cuna, MD  REFERRING DIAG:  Diagnosis  M43.16 (ICD-10-CM) - Spondylolisthesis at L4-L5 level   THERAPY DIAG:  Muscle weakness (generalized)  Pain in right hip  Radiculopathy, lumbosacral region  Cramp and spasm  Other low back pain  Rationale for Evaluation and Treatment: Rehabilitation  ONSET DATE: October 2024  SUBJECTIVE:   SUBJECTIVE STATEMENT Patient states that he is having increased pain this morning.  States that he did not do much this weekend due to the rain.  PERTINENT HISTORY: RA, IT band syndrome, ACDF of C5-7 in 1991, Lumbar Laminectomy L5-S1 October 2023  PAIN: 01/19/24 Are you having pain? Yes: NPRS scale: 5-6/10 Pain location: low back Pain description: shooting, sharp, sometimes aching and radiating down Rt LE Aggravating factors: standing and walking Relieving factors: sitting  PRECAUTIONS: None  RED FLAGS: None   WEIGHT BEARING RESTRICTIONS: No  FALLS:  Has patient fallen in last 6 months? No  LIVING ENVIRONMENT: Lives with: lives with their spouse Lives in: House/apartment Stairs:  one story home Has following equipment at home: Grab bars  OCCUPATION: retired, formerly a Designer, television/film set  PLOF: Independent and Leisure: yard work Standing: 3-5 minutes static, moving around 5-8 min   The Patient-Specific Functional Scale  Initial:  I am going to ask you to identify up to 3 important activities that you are unable to do or are having difficulty with as a result of this problem.  Today are there any activities that you are unable to do or having difficulty with because of this?  (Patient shown scale and patient rated each activity)  Follow up: When you first came in you had difficulty performing these activities.  Today do you still have difficulty?  Patient-Specific activity scoring scheme (Point to one number): PSFS  0 1 2 3 4 5 6 7 8 9  10 Unable                                                                                                           Able to perform To perform  activity at the same Activity         Level as before                                                                                                                       Injury or problem  Activity  Unloading dishwasher Initial: 5/10   follow up on 01/26/24: 6-7/10  2.   Standing for  shower  Initial:  3-4/10                     follow up on 01/26/24:  7/10   PATIENT GOALS: To be able to walk/stand without hurting, avoid surgery for now.   NEXT MD VISIT: Pt returns to Dr Dannis Dy on 02/01/24  OBJECTIVE:  Note: Objective measures were completed at Evaluation unless otherwise noted.  DIAGNOSTIC FINDINGS:  Right Hip Radiograph on 10/06/2023: FINDINGS: There is no evidence of hip fracture or dislocation. No significant arthropathy. No bony lesions identified. The bony pelvis is unremarkable.  Lumbar MRI on 11/23/23:  1. 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 2. Interval left laminectomy at L5-S1 without residual lateral recess stenosis. Unchanged severe left neural foraminal stenosis. 3. 3.2 cm abdominal aortic aneurysm. Recommend surveillance ultrasound in 3 years.  PATIENT SURVEYS:  Eval: Modified Oswestry: 19/50= 38% limitation  01/26/2024:  Modified Oswestry Low Back Pain Disability Questionnaire: 18 / 50 = 36.0 %  COGNITION: Overall cognitive status: Within functional limits for tasks assessed     SENSATION: Reports some numbness and tingling down his right leg, especially with prolonged standing.  MUSCLE LENGTH: Hamstrings: tightness noted bilat  POSTURE: rounded shoulders, forward head, and flexed trunk   PALPATION: Tenderness to palpation Rt gluteals and Lt lumbar spine  LOWER EXTREMITY ROM: Lumbar A/ROM: flexion full without pain, extension to neutral with Rt LE pain, Lt sidebending full  without pain, Rt sidebending to neutral with pain  Rt hip flexibility: unable to perform figure 4 on Rt in sitting  LOWER EXTREMITY MMT:  Eval:   Right hip strength grossly 4-/5 Right hamstring strength is 4/5 Right quad strength is 5-/5 Left LE strength is WFL   FUNCTIONAL ASSESSMENTS:  01/05/2024: 6 minute walk test:  929 ft with pain of 6/10 (patient requires standing recovery periods)  GAIT: Distance walked: >500 ft Assistive device utilized: None Level of assistance: Complete Independence Comments: Patient with antalgic gait with decreased stance time on right LE  TODAY'S TREATMENT   01/30/2024: Recumbent bike level 3 x6 min with PT present to discuss status Standing shoulder rows with blue tband 2x10 Standing shoulder extension with blue tband 2x10 Standing paloff press with blue tband x20 bilat Hip Matrix with 25#:  hip abduction and hip extension.  2x10 each bilat Supine hamstring stretch with strap 2x20 sec bilat Supine IT band stretch with strap 2x20 sec bilat Supine clamshell with yellow loop 2x10 Supine marching with transversus abdominus contraction and yellow loop around knees 2x10 Prone hip extension 2x5 bilat   01/26/2024: Recumbent bike level 3 x6 min with PT present to discuss status Modified Oswestry and PSFS Standing shoulder rows with blue tband 2x10 Standing shoulder extension with blue tband 2x10 Standing paloff press with blue tband x20 bilat Hip Matrix with 25#:  hip abduction and hip extension.  2x10 each bilat Supine hamstring stretch with strap 2x20 sec bilat Supine IT band stretch with strap 2x20 sec bilat Supine marching with transversus abdominus contraction 2x10   01/23/2024: Recumbent bike level 3 x6 min with PT present to discuss status Standing shoulder rows with blue tband 2x10 Standing shoulder extension  with blue tband 2x10 Standing paloff press with blue tband x20 bilat Supine hamstring stretch with strap 2x20 sec bilat Supine IT band stretch with strap 2x20 sec bilat Supine hip adduction stretch with strap 2x20 sec bilat Supine marching with transversus abdominus contraction 2x10 Supine 90/90 heel tap down x10 Supine 90/90 with leg extension x10 bilat Bridge with alt knee extension x 10 bilat Supine straight leg raise with transversus abdominus contraction x10 bilat Supine piriformis stretch x30 sec bilat Prone hip extension 2x10 bilat Open book x 5 B then 5 breath hold at end    PATIENT EDUCATION:  Education details: Research scientist (medical) Person educated: Patient Education method: Explanation, Facilities manager, and Handouts Education comprehension: verbalized understanding  HOME EXERCISE PROGRAM: Access Code: [email or web address] URL: [email or web address] Date: 01/09/2024 Prepared by: Raynelle Fanning  Exercises - Seated Scapular Retraction  - 1 x daily - 7 x weekly - 2 sets - 10 reps - Seated Hamstring Stretch  - 1 x daily - 7 x weekly - 2 reps - 20 sec hold - Clamshell  - 1 x daily - 7 x weekly - 2 sets - 10 reps - Seated Figure 4 Piriformis Stretch  - 1 x daily - 7 x weekly - 3 sets - 10 reps - Supine Figure 4 Piriformis Stretch  - 3 x daily - 7 x weekly - 1 sets - 3 reps - 20 hold - Hooklying Single Knee to Chest Stretch  - 3 x daily - 7 x weekly - 1 sets - 3 reps - 20 hold - Supine Double Knee to Chest  - 3 x daily - 7 x weekly - 1 sets - 3 reps - 20 hold - Hip Hiking on Step  - 1 x daily - 3 x weekly - 2 sets - 10 reps - Standing Isometric Hip Abduction with Knee at 90 at Wall  - 1 x daily - 3 x weekly - 2 sets - 10 reps - 5 sec hold  ASSESSMENT:  CLINICAL IMPRESSION: Mr Duplantis presents to skilled PT reporting increased pain today.  Patient still agreeable to performing exercises that we had done in the past, including the Hip Matrix machine.  Patient continues with increased difficulty  with hip extension secondary to weak glutes.  Patient continues to progress with strengthening during session as well as core stability.  Patient continues to  require skilled PT to progress towards goal related activities.  OBJECTIVE IMPAIRMENTS: decreased balance, difficulty walking, decreased strength, increased muscle spasms, impaired flexibility, postural dysfunction, and pain.     GOALS: Goals reviewed with patient? Yes  SHORT TERM GOALS: Target date: 01/18/24 Patient will be independent with initial HEP. Baseline: Goal status: Met on 01/05/24  2.  Patient will report at least a 25% improvement in Rt LE and low back pain with standing  Baseline:  Goal status: Met on 01/05/24  3.  Stand for > or = to 5-7 minutes for self-care tasks without limitation  Baseline: 3-5 minutes  Goal status: Met on 01/26/24   LONG TERM GOALS: Target date: 02/14/2024   Patient will be independent with advanced HEP to allow for self progression after discharge. Baseline:  Goal status: Ongoing  2.  Modified Oswestry to < or = to 10/50 (20%) for improved function Baseline: 19/50 (38%) Goal status: Ongoing (see above)  3.  Improve PSFS to > or = to 7/10 for unloading the dishwasher due to reduced pain Baseline: 5/10 Goal status: Met on 01/26/24  4.  Improve PSFS to > or = to 5-6/10 for taking a shower due to reduced pain Baseline: 3-4/10 Goal status: Met on 01/26/24  5. Report > or = to 40% reduction in low back and Rt LE symptoms with standing and walking   Baseline: 7-8/10  Goal Status: Ongoing   PLAN:  PT FREQUENCY: 2x/week  PT DURATION: 8 weeks  PLANNED INTERVENTIONS: 97164- PT Re-evaluation, 97110-Therapeutic exercises, 97530- Therapeutic activity, 97112- Neuromuscular re-education, 97535- Self Care, 19147- Manual therapy, [id number]- Gait training, [phone]- Canalith repositioning, [id number]- Aquatic Therapy, 97014- Electrical stimulation (unattended), [id number]- Electrical stimulation (manual), 97016-  Vasopneumatic device, [id number]- Ultrasound, [id number]- Traction (mechanical), Patient/Family education, Balance training, Stair training, Taping, Dry Needling, Joint mobilization, Joint manipulation, Spinal manipulation, Spinal mobilization, Cryotherapy, Moist heat, and Biofeedback  PLAN FOR NEXT SESSION: Resume more standing exercises as pain allows, flexion based exercise, LE strength in sitting or supine/side lying as tolerated, Glute med strength, standing balance when able to progress    Robyne Christen, PT, DPT 01/30/24, 12:29 PM  [facility] [address] [place], [place] [address] Phone # [phone] Fax [phone]

## 2024-02-01 ENCOUNTER — Ambulatory Visit: Admitting: Family Medicine

## 2024-02-01 ENCOUNTER — Encounter: Payer: Self-pay | Admitting: Family Medicine

## 2024-02-01 VITALS — BP 122/84 | Ht 76.0 in | Wt 210.0 lb

## 2024-02-01 DIAGNOSIS — M48061 Spinal stenosis, lumbar region without neurogenic claudication: Secondary | ICD-10-CM | POA: Diagnosis not present

## 2024-02-01 DIAGNOSIS — M5431 Sciatica, right side: Secondary | ICD-10-CM

## 2024-02-01 MED ORDER — PREDNISONE 10 MG PO TABS: ORAL_TABLET | ORAL | 0 refills | Status: DC

## 2024-02-01 NOTE — Assessment & Plan Note (Addendum)
-   He responded very well to his last epidural injection.  Unfortunately this new fall has aggravated all of his previous symptoms.  He does not have any new symptoms or red flags.  -We will start him on a steroid Dosepak.  Hopefully this gives him some relief. - The patient will contact his interventional list to inquire about his next possible injection. - I did discuss the timeline for recovery and pros/cons of surgery.

## 2024-02-01 NOTE — Progress Notes (Signed)
 Isaiah Price - 73 y.o. male MRN [id number]  Date of birth: [birth year]  PCP: Barbie Banner, MD  Subjective:  No chief complaint on file. Lumbar pain with right-sided radiculopathy  HPI: Past Medical, Surgical, Social, and Family History Reviewed & Updated per EMR.   Patient is a 73 y.o. male here after a fall while gardening.  He did not have a traumatic landing but while going down he aggravated his lumbar area with tightness and spasticity that has worsened his previous symptoms.  The injection he got recently completely resolved the pain rating down his right leg and he was able to increase his activities.  He denies any numbness or weakness, dizziness or lightheadedness that caused this fall and he currently does not have any numbness or weakness but has radiating pain down the right lateral thigh and posteriorly, intermittently.  He denies any loss of bowel or bladder.  Past Medical History:  Diagnosis Date   Achilles tendinitis, left leg    At risk for sleep apnea    STOP--BANG SCORE= 5  (routed to pt's pcp in epic 11-13-2018)   Cervicogenic headache 06/08/2021   Chronic insomnia 12/31/2015   Chronic neck pain    Congenital pectus excavatum    per pt had pulmonary test ,  60% restrictive by states asymptomatic   DDD (degenerative disc disease), cervical    Epicondylitis, lateral, right    Gait abnormality 06/08/2021   GERD (gastroesophageal reflux disease)    History of exercise intolerance    09-02-2017  ETT,  negative for ischemia   History of pericarditis 1985   HTN (hypertension)    Hypercholesteremia 12/31/2015   Iritis    IT band syndrome 05/06/2014   Lower urinary tract symptoms (LUTS)    Neuropathy    bilateral arms with tingling and numbness at times due to cervical neck   OA (osteoarthritis)    Polyarthralgia    Prostate cancer [facility])    urologist-  dr Laverle Patter--- Stage T1c, Gleason 3+4, 06/08/21 recurrent prostate cancer   Rheumatoid arthritis [facility])     rheumatologist-- dr Dierdre Forth--- seropositive,multiple sites   Tendon disorder 2022   torn tendon, right arm   Wears glasses     Current Outpatient Medications on File Prior to Visit  Medication Sig Dispense Refill   allopurinol (ZYLOPRIM) 100 MG tablet Take 200 mg by mouth daily.     diltiazem (CARDIZEM) 90 MG tablet Take 90 mg by mouth at bedtime.      fluticasone (FLONASE) 50 MCG/ACT nasal spray Place 1 spray into the nose daily as needed for allergies.      HYDROcodone-acetaminophen (NORCO) 10-325 MG tablet TAKE 1 TABLET EVERY 6 HOURS AS NEEDED FOR MODERATE TO SEVERE PAIN. 20 tablet 0   nortriptyline (PAMELOR) 10 MG capsule Take 2 capsules at night 180 capsule 3   omeprazole (PRILOSEC) 40 MG capsule Take 40 mg by mouth daily.      Polyvinyl Alcohol-Povidone (REFRESH OP) Place 1 drop into both eyes 2 (two) times daily as needed (dry eyes).      pregabalin (LYRICA) 25 MG capsule Take 2 capsules (50 mg total) by mouth 2 (two) times daily. 120 capsule 1   rosuvastatin (CRESTOR) 40 MG tablet TAKE 1 TABLET BY MOUTH DAILY 90 tablet 3   traMADol (ULTRAM) 50 MG tablet Take 1 tablet (50 mg total) by mouth every 8 (eight) hours as needed (pain). TAKE (1) TABLET EVERY EIGHT HOURS AS NEEDED FOR PAIN. 60 tablet 0  tretinoin (RETIN-A) 0.05 % cream Apply a thin layer to face nightly     zolpidem (AMBIEN) 10 MG tablet Take 10 mg by mouth at bedtime as needed for sleep.     Current Facility-Administered Medications on File Prior to Visit  Medication Dose Route Frequency Provider Last Rate Last Admin   hydrocortisone sodium succinate (SOLU-CORTEF) injection 100 mg  100 mg Intravenous Once Heloise Purpura, MD        Past Surgical History:  Procedure Laterality Date   ANTERIOR CERVICAL DECOMP/DISCECTOMY FUSION  77   dr Newell Coral   C5 -- 7  (per pt fusion failed)   CARPAL TUNNEL RELEASE Right done at same time of CMC fusion   CARPOMETACARPAL (CMC) FUSION OF THUMB Bilateral 2015 and 2014  approx.    CATARACT EXTRACTION W/ INTRAOCULAR LENS  IMPLANT, BILATERAL  2013   LYMPHADENECTOMY Bilateral 11/16/2018   Procedure: LYMPHADENECTOMY, PELVIC;  Surgeon: Heloise Purpura, MD;  Location: [facility];  Service: Urology;  Laterality: Bilateral;   ROBOT ASSISTED LAPAROSCOPIC RADICAL PROSTATECTOMY N/A 11/16/2018   Procedure: XI ROBOTIC ASSISTED LAPAROSCOPIC RADICAL PROSTATECTOMY LEVEL 2;  Surgeon: Heloise Purpura, MD;  Location: [facility];  Service: Urology;  Laterality: N/A;   SHOULDER SURGERY Left age 76   TONSILLECTOMY  child    Allergies  Allergen Reactions   Nsaids Other (See Comments)    Causes GI bleeds (numerous times)   Brimonidine Itching and Other (See Comments)    redness Abdominal Pain   Gabapentin Other (See Comments)    Couldn't talk   Meloxicam Other (See Comments)   Promethazine Hcl     Vision changes   Trazodone     Other reaction(s): Other (See Comments) Bladder incontinence        Objective:  Physical Exam: VS: BP:122/84  HR: bpm  TEMP: ( )  RESP:   HT:6\' 4"  (193 cm)   WT:210 lb (95.3 kg)  BMI:25.57  Gen: NAD, speaks clearly, comfortable in exam room Respiratory: Normal respiratory effort on room air. No signs of distress Skin: No rashes, abrasions, or ecchymosis MSK:  Back Exam:  Inspection: Loss of lumbar lordosis Motion: Flexion and extension elicits pain SLR laying: Positive at 60 degrees.  H test gives 15 degrees extra prior to pain Palpable tenderness: Paraspinal musculature at the L3-L5 levels right greater than left FABER: Negative Piriformis test negative None tenderness to palpation over the piriformis Sensory change: Gross sensation intact to all lumbar and sacral dermatomes.  Reflexes: 1+ at both patellar tendons Strength at foot  Plantar-flexion: 5/5 Dorsi-flexion: 5/5 Eversion: 5/5 Inversion: 5/5  Leg strength  Quad: 4/5 Hamstring: 4/5 Hip abductors: 4/5  Gait unremarkable.     Assessment & Plan:   Sciatic pain, right - He responded very  well to his last epidural injection.  Unfortunately this new fall has aggravated all of his previous symptoms.  He does not have any new symptoms or red flags.  -We will start him on a steroid Dosepak.  Hopefully this gives him some relief. - The patient will contact his interventional list to inquire about his next possible injection. - I did discuss the timeline for recovery and pros/cons of surgery.  Spinal stenosis of lumbar region Plan as above    Rica Mote MD [facility] Sports Medicine Fellow

## 2024-02-01 NOTE — Assessment & Plan Note (Signed)
 Plan as above.

## 2024-02-02 ENCOUNTER — Ambulatory Visit

## 2024-02-02 DIAGNOSIS — R262 Difficulty in walking, not elsewhere classified: Secondary | ICD-10-CM

## 2024-02-02 DIAGNOSIS — R293 Abnormal posture: Secondary | ICD-10-CM

## 2024-02-02 DIAGNOSIS — M5459 Other low back pain: Secondary | ICD-10-CM

## 2024-02-02 DIAGNOSIS — M6281 Muscle weakness (generalized): Secondary | ICD-10-CM | POA: Diagnosis not present

## 2024-02-02 DIAGNOSIS — M5417 Radiculopathy, lumbosacral region: Secondary | ICD-10-CM

## 2024-02-02 DIAGNOSIS — M25551 Pain in right hip: Secondary | ICD-10-CM

## 2024-02-02 DIAGNOSIS — R252 Cramp and spasm: Secondary | ICD-10-CM

## 2024-02-02 DIAGNOSIS — G5702 Lesion of sciatic nerve, left lower limb: Secondary | ICD-10-CM

## 2024-02-02 NOTE — Therapy (Signed)
 OUTPATIENT PHYSICAL THERAPY LUMBAR TREATMENT   Patient Name: Isaiah Price MRN: [id number] DOB:[birth year], 73 y.o., male Today's Date: 02/02/2024     END OF SESSION:  PT End of Session - 02/02/24 1250     Visit Number 12    Date for PT Re-Evaluation 02/15/24    Authorization Type Medicare/ BCBC- KX Modifier Needed    Progress Note Due on Visit 20    PT Start Time 1247    PT Stop Time 1315    PT Time Calculation (min) 28 min    Activity Tolerance Patient tolerated treatment well    Behavior During Therapy WFL for tasks assessed/performed               Past Medical History:  Diagnosis Date   Achilles tendinitis, left leg    At risk for sleep apnea    STOP--BANG SCORE= 5  (routed to pt's pcp in epic 11-13-2018)   Cervicogenic headache 06/08/2021   Chronic insomnia 12/31/2015   Chronic neck pain    Congenital pectus excavatum    per pt had pulmonary test ,  60% restrictive by states asymptomatic   DDD (degenerative disc disease), cervical    Epicondylitis, lateral, right    Gait abnormality 06/08/2021   GERD (gastroesophageal reflux disease)    History of exercise intolerance    09-02-2017  ETT,  negative for ischemia   History of pericarditis 1985   HTN (hypertension)    Hypercholesteremia 12/31/2015   Iritis    IT band syndrome 05/06/2014   Lower urinary tract symptoms (LUTS)    Neuropathy    bilateral arms with tingling and numbness at times due to cervical neck   OA (osteoarthritis)    Polyarthralgia    Prostate cancer [facility])    urologist-  dr Rozanne Corners--- Stage T1c, Gleason 3+4, 06/08/21 recurrent prostate cancer   Rheumatoid arthritis [facility])    rheumatologist-- dr Ebbie Goldmann--- seropositive,multiple sites   Tendon disorder 2022   torn tendon, right arm   Wears glasses    Past Surgical History:  Procedure Laterality Date   ANTERIOR CERVICAL DECOMP/DISCECTOMY FUSION  34   dr Cipriano Creeks   C5 -- 7  (per pt fusion failed)   CARPAL TUNNEL RELEASE Right done at  same time of CMC fusion   CARPOMETACARPAL (CMC) FUSION OF THUMB Bilateral 2015 and 2014  approx.   CATARACT EXTRACTION W/ INTRAOCULAR LENS  IMPLANT, BILATERAL  2013   LYMPHADENECTOMY Bilateral 11/16/2018   Procedure: LYMPHADENECTOMY, PELVIC;  Surgeon: Florencio Hunting, MD;  Location: [facility];  Service: Urology;  Laterality: Bilateral;   ROBOT ASSISTED LAPAROSCOPIC RADICAL PROSTATECTOMY N/A 11/16/2018   Procedure: XI ROBOTIC ASSISTED LAPAROSCOPIC RADICAL PROSTATECTOMY LEVEL 2;  Surgeon: Florencio Hunting, MD;  Location: [facility];  Service: Urology;  Laterality: N/A;   SHOULDER SURGERY Left age 32   TONSILLECTOMY  child   Patient Active Problem List   Diagnosis Date Noted   Spinal stenosis of lumbar region 01/11/2024   Dyslipidemia 10/02/2023   Aortic root enlargement (HCC) 10/02/2023   Elevated coronary artery calcium score 10/02/2023   Sciatic pain, right 09/07/2023   Palpitations 09/27/2021   Cervicogenic headache 06/08/2021   Gait abnormality 06/08/2021   Prostate cancer (HCC) 11/16/2018   Malignant neoplasm of prostate (HCC) 09/18/2018   Daytime somnolence 08/04/2017   Dyspnea on exertion 08/04/2017   Exercise intolerance 08/04/2017   Need for influenza vaccination 07/01/2017   Ulnar neuropathy at elbow, 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   Essential hypertension 01/12/2017   Groin  strain 11/18/2016   Irregular bowel habits 11/18/2016   Rheumatoid arthritis (HCC) 01/22/2016   Chronic insomnia 12/31/2015   Chronic pain 12/31/2015   Esophageal reflux 12/31/2015   Hypercholesteremia 12/31/2015   Chronic arthritis 12/31/2015   Wrist pain, right 12/01/2015   IT band syndrome 05/06/2014   Cervical radiculopathy 07/14/2011   Arthritis pain, hand 06/02/2011   Osteoarthritis of CMC joint of thumb 06/02/2011    PCP: Tura Gaines, MD  REFERRING PROVIDER: Claybon Cuna, MD  REFERRING DIAG:  Diagnosis  M43.16 (ICD-10-CM) - Spondylolisthesis at L4-L5 level   THERAPY DIAG:  Muscle weakness  (generalized)  Pain in right hip  Radiculopathy, lumbosacral region  Difficulty in walking, not elsewhere classified  Cramp and spasm  Abnormal posture  Other low back pain  Piriformis syndrome, left  Rationale for Evaluation and Treatment: Rehabilitation  ONSET DATE: October 2024  SUBJECTIVE:   SUBJECTIVE STATEMENT Patient arrives 17 min late for appt.  He got his times mixed up. He is very Counselling psychologist.  He states sports medicine provider started him on dose pack of prednisone and he started this yesterday.  He doesn't feel much improvement yet.    PERTINENT HISTORY: RA, IT band syndrome, ACDF of C5-7 in 1991, Lumbar Laminectomy L5-S1 October 2023  PAIN: 02/02/24 Are you having pain? Yes: NPRS scale: 2/10 Pain location: low back Pain description: shooting, sharp, sometimes aching and radiating down Rt LE Aggravating factors: standing and walking Relieving factors: sitting  PRECAUTIONS: None  RED FLAGS: None   WEIGHT BEARING RESTRICTIONS: No  FALLS:  Has patient fallen in last 6 months? No  LIVING ENVIRONMENT: Lives with: lives with their spouse Lives in: House/apartment Stairs:  one story home Has following equipment at home: Grab bars  OCCUPATION: retired, formerly a Designer, television/film set  PLOF: Independent and Leisure: yard work Standing: 3-5 minutes static, moving around 5-8 min   The Patient-Specific Functional Scale  Initial:  I am going to ask you to identify up to 3 important activities that you are unable to do or are having difficulty with as a result of this problem.  Today are there any activities that you are unable to do or having difficulty with because of this?  (Patient shown scale and patient rated each activity)  Follow up: When you first came in you had difficulty performing these activities.  Today do you still have difficulty?  Patient-Specific activity scoring scheme (Point to one number): PSFS  0 1 2 3 4 5 6 7 8 9  10 Unable                                                                                                           Able to perform To perform  activity at the same Activity         Level as before                                                                                                                       Injury or problem  Activity  Unloading dishwasher Initial: 5/10   follow up on 01/26/24: 6-7/10  2.   Standing for  shower  Initial:  3-4/10                     follow up on 01/26/24:  7/10   PATIENT GOALS: To be able to walk/stand without hurting, avoid surgery for now.   NEXT MD VISIT: Pt returns to Dr Dannis Dy on 02/01/24  OBJECTIVE:  Note: Objective measures were completed at Evaluation unless otherwise noted.  DIAGNOSTIC FINDINGS:  Right Hip Radiograph on 10/06/2023: FINDINGS: There is no evidence of hip fracture or dislocation. No significant arthropathy. No bony lesions identified. The bony pelvis is unremarkable.  Lumbar MRI on 11/23/23:  1. 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 2. Interval left laminectomy at L5-S1 without residual lateral recess stenosis. Unchanged severe left neural foraminal stenosis. 3. 3.2 cm abdominal aortic aneurysm. Recommend surveillance ultrasound in 3 years.  PATIENT SURVEYS:  Eval: Modified Oswestry: 19/50= 38% limitation  01/26/2024:  Modified Oswestry Low Back Pain Disability Questionnaire: 18 / 50 = 36.0 %  COGNITION: Overall cognitive status: Within functional limits for tasks assessed     SENSATION: Reports some numbness and tingling down his right leg, especially with prolonged standing.  MUSCLE LENGTH: Hamstrings: tightness noted bilat  POSTURE: rounded shoulders, forward head, and flexed trunk   PALPATION: Tenderness to palpation Rt gluteals and Lt lumbar spine  LOWER EXTREMITY ROM: Lumbar A/ROM: flexion full without  pain, extension to neutral with Rt LE pain, Lt sidebending full without pain, Rt sidebending to neutral with pain  Rt hip flexibility: unable to perform figure 4 on Rt in sitting  LOWER EXTREMITY MMT:  Eval:   Right hip strength grossly 4-/5 Right hamstring strength is 4/5 Right quad strength is 5-/5 Left LE strength is WFL   FUNCTIONAL ASSESSMENTS:  01/05/2024: 6 minute walk test:  929 ft with pain of 6/10 (patient requires standing recovery periods)  GAIT: Distance walked: >500 ft Assistive device utilized: None Level of assistance: Complete Independence Comments: Patient with antalgic gait with decreased stance time on right LE  TODAY'S TREATMENT  02/02/2024: (patient 17 min late for appt- got appt time mixed up) Standing hamstring stretch 2 x 30 sec Standing quad/hip flexor stretch 2 x 30 sec  Seated piriformis stretch 2 x 30 sec Quadruped with 8 lb weights for wrist support cat/camel x 10 Quadruped with alternating arms x 10 each arm (20 alternating) Quadruped with alternating legs x 10 each leg (20 alternating)  01/30/2024: Recumbent bike level 3 x6 min with PT present to discuss status Standing shoulder rows with blue tband 2x10 Standing shoulder extension with blue tband 2x10 Standing paloff press with blue tband x20 bilat Hip Matrix with 25#:  hip abduction and hip extension.  2x10 each bilat Supine hamstring stretch with strap 2x20 sec bilat Supine IT band stretch with strap 2x20 sec bilat Supine clamshell with yellow loop 2x10 Supine marching with transversus abdominus contraction and yellow loop around knees 2x10 Prone hip extension 2x5 bilat   01/26/2024: Recumbent bike level 3 x6 min with PT present to discuss status Modified Oswestry and PSFS Standing shoulder rows with blue tband 2x10 Standing shoulder extension with blue tband  2x10 Standing paloff press with blue tband x20 bilat Hip Matrix with 25#:  hip abduction and hip extension.  2x10 each bilat Supine hamstring stretch with strap 2x20 sec bilat Supine IT band stretch with strap 2x20 sec bilat Supine marching with transversus abdominus contraction 2x10   01/23/2024: Recumbent bike level 3 x6 min with PT present to discuss status Standing shoulder rows with blue tband 2x10 Standing shoulder extension with blue tband 2x10 Standing paloff press with blue tband x20 bilat Supine hamstring stretch with strap 2x20 sec bilat Supine IT band stretch with strap 2x20 sec bilat Supine hip adduction stretch with strap 2x20 sec bilat Supine marching with transversus abdominus contraction 2x10 Supine 90/90 heel tap down x10 Supine 90/90 with leg extension x10 bilat Bridge with alt knee extension x 10 bilat Supine straight leg raise with transversus abdominus contraction x10 bilat Supine piriformis stretch x30 sec bilat Prone hip extension 2x10 bilat Open book x 5 B then 5 breath hold at end    PATIENT EDUCATION:  Education details: Research scientist (medical) Person educated: Patient Education method: Explanation, Facilities manager, and Handouts Education comprehension: verbalized understanding  HOME EXERCISE PROGRAM: Access Code: [email or web address] URL: [email or web address] Date: 01/09/2024 Prepared by: Raynelle Fanning  Exercises - Seated Scapular Retraction  - 1 x daily - 7 x weekly - 2 sets - 10 reps - Seated Hamstring Stretch  - 1 x daily - 7 x weekly - 2 reps - 20 sec hold - Clamshell  - 1 x daily - 7 x weekly - 2 sets - 10 reps - Seated Figure 4 Piriformis Stretch  - 1 x daily - 7 x weekly - 3 sets - 10 reps - Supine Figure 4 Piriformis Stretch  - 3 x daily - 7 x weekly - 1 sets - 3 reps - 20 hold - Hooklying Single Knee to Chest Stretch  - 3 x daily - 7 x weekly - 1 sets - 3 reps - 20 hold - Supine Double Knee to Chest  - 3 x daily - 7 x weekly - 1 sets - 3 reps - 20 hold -  Hip Hiking on Step  - 1 x daily - 3 x weekly - 2 sets - 10 reps - Standing Isometric Hip Abduction with Knee at 90 at Wall  - 1 x daily - 3 x weekly - 2 sets - 10 reps - 5 sec  hold  ASSESSMENT:  CLINICAL IMPRESSION: Mr Strege was late for appt today as he got the times mixed up.  He was able to complete the above exercises with minimal pain.  We added quadruped activity and he was able to tolerate this as long as he kept the core engaged.  Patient continues to progress with strengthening during session as well as core stability.  Patient continues to require skilled PT to progress towards goal related activities.  OBJECTIVE IMPAIRMENTS: decreased balance, difficulty walking, decreased strength, increased muscle spasms, impaired flexibility, postural dysfunction, and pain.     GOALS: Goals reviewed with patient? Yes  SHORT TERM GOALS: Target date: 01/18/24 Patient will be independent with initial HEP. Baseline: Goal status: Met on 01/05/24  2.  Patient will report at least a 25% improvement in Rt LE and low back pain with standing  Baseline:  Goal status: Met on 01/05/24  3.  Stand for > or = to 5-7 minutes for self-care tasks without limitation  Baseline: 3-5 minutes  Goal status: Met on 01/26/24   LONG TERM GOALS: Target date: 02/14/2024   Patient will be independent with advanced HEP to allow for self progression after discharge. Baseline:  Goal status: Ongoing  2.  Modified Oswestry to < or = to 10/50 (20%) for improved function Baseline: 19/50 (38%) Goal status: Ongoing (see above)  3.  Improve PSFS to > or = to 7/10 for unloading the dishwasher due to reduced pain Baseline: 5/10 Goal status: Met on 01/26/24  4.  Improve PSFS to > or = to 5-6/10 for taking a shower due to reduced pain Baseline: 3-4/10 Goal status: Met on 01/26/24  5. Report > or = to 40% reduction in low back and Rt LE symptoms with standing and walking   Baseline: 7-8/10  Goal Status:  Ongoing   PLAN:  PT FREQUENCY: 2x/week  PT DURATION: 8 weeks  PLANNED INTERVENTIONS: 97164- PT Re-evaluation, 97110-Therapeutic exercises, 97530- Therapeutic activity, 97112- Neuromuscular re-education, 97535- Self Care, [id number]- Manual therapy, [id number]- Gait training, [phone]- Canalith repositioning, [id number]- Aquatic Therapy, 97014- Electrical stimulation (unattended), [id number]- Electrical stimulation (manual), 97016- Vasopneumatic device, [id number]- Ultrasound, [id number]- Traction (mechanical), Patient/Family education, Balance training, Stair training, Taping, Dry Needling, Joint mobilization, Joint manipulation, Spinal manipulation, Spinal mobilization, Cryotherapy, Moist heat, and Biofeedback  PLAN FOR NEXT SESSION: Resume more standing exercises as pain allows, flexion based exercise, LE strength in sitting or supine/side lying as tolerated, Glute med strength, standing balance when able to progress    [place] B. Ajene Carchi, PT 02/02/24 4:23 PM [facility] [address] [place], [place] [address] Phone # [phone] Fax [phone]

## 2024-02-06 ENCOUNTER — Encounter: Payer: Self-pay | Admitting: Rehabilitative and Restorative Service Providers"

## 2024-02-06 ENCOUNTER — Ambulatory Visit: Admitting: Rehabilitative and Restorative Service Providers"

## 2024-02-06 DIAGNOSIS — R252 Cramp and spasm: Secondary | ICD-10-CM

## 2024-02-06 DIAGNOSIS — M5459 Other low back pain: Secondary | ICD-10-CM

## 2024-02-06 DIAGNOSIS — M6281 Muscle weakness (generalized): Secondary | ICD-10-CM

## 2024-02-06 DIAGNOSIS — M25551 Pain in right hip: Secondary | ICD-10-CM

## 2024-02-06 DIAGNOSIS — M5417 Radiculopathy, lumbosacral region: Secondary | ICD-10-CM

## 2024-02-06 NOTE — Therapy (Signed)
 OUTPATIENT PHYSICAL THERAPY LUMBAR TREATMENT   Patient Name: Isaiah Price MRN: [id number] DOB:26-Oct-1950, 73 y.o., male Today's Date: 02/06/2024     END OF SESSION:  PT End of Session - 02/06/24 1152     Visit Number 13    Date for PT Re-Evaluation 02/15/24    Authorization Type Medicare/ BCBC- KX Modifier Needed    Progress Note Due on Visit 20    PT Start Time 1147    PT Stop Time 1227    PT Time Calculation (min) 40 min    Activity Tolerance Patient tolerated treatment well    Behavior During Therapy WFL for tasks assessed/performed               Past Medical History:  Diagnosis Date   Achilles tendinitis, left leg    At risk for sleep apnea    STOP--BANG SCORE= 5  (routed to pt's pcp in epic 11-13-2018)   Cervicogenic headache 06/08/2021   Chronic insomnia 12/31/2015   Chronic neck pain    Congenital pectus excavatum    per pt had pulmonary test ,  60% restrictive by states asymptomatic   DDD (degenerative disc disease), cervical    Epicondylitis, lateral, right    Gait abnormality 06/08/2021   GERD (gastroesophageal reflux disease)    History of exercise intolerance    09-02-2017  ETT,  negative for ischemia   History of pericarditis 1985   HTN (hypertension)    Hypercholesteremia 12/31/2015   Iritis    IT band syndrome 05/06/2014   Lower urinary tract symptoms (LUTS)    Neuropathy    bilateral arms with tingling and numbness at times due to cervical neck   OA (osteoarthritis)    Polyarthralgia    Prostate cancer [facility])    urologist-  dr Rozanne Corners--- Stage T1c, Gleason 3+4, 06/08/21 recurrent prostate cancer   Rheumatoid arthritis [facility])    rheumatologist-- dr Ebbie Goldmann--- seropositive,multiple sites   Tendon disorder 2022   torn tendon, right arm   Wears glasses    Past Surgical History:  Procedure Laterality Date   ANTERIOR CERVICAL DECOMP/DISCECTOMY FUSION  1991   dr Cipriano Creeks   C5 -- 7  (per pt fusion failed)   CARPAL TUNNEL RELEASE Right done at  same time of CMC fusion   CARPOMETACARPAL (CMC) FUSION OF THUMB Bilateral 2015 and 2014  approx.   CATARACT EXTRACTION W/ INTRAOCULAR LENS  IMPLANT, BILATERAL  2013   LYMPHADENECTOMY Bilateral 11/16/2018   Procedure: LYMPHADENECTOMY, PELVIC;  Surgeon: Florencio Hunting, MD;  Location: [facility];  Service: Urology;  Laterality: Bilateral;   ROBOT ASSISTED LAPAROSCOPIC RADICAL PROSTATECTOMY N/A 11/16/2018   Procedure: XI ROBOTIC ASSISTED LAPAROSCOPIC RADICAL PROSTATECTOMY LEVEL 2;  Surgeon: Florencio Hunting, MD;  Location: [facility];  Service: Urology;  Laterality: N/A;   SHOULDER SURGERY Left age 89   TONSILLECTOMY  child   Patient Active Problem List   Diagnosis Date Noted   Spinal stenosis of lumbar region 01/11/2024   Dyslipidemia 10/02/2023   Aortic root enlargement (HCC) 10/02/2023   Elevated coronary artery calcium  score 10/02/2023   Sciatic pain, right 09/07/2023   Palpitations 09/27/2021   Cervicogenic headache 06/08/2021   Gait abnormality 06/08/2021   Prostate cancer (HCC) 11/16/2018   Malignant neoplasm of prostate (HCC) 09/18/2018   Daytime somnolence 08/04/2017   Dyspnea on exertion 08/04/2017   Exercise intolerance 08/04/2017   Need for influenza vaccination 07/01/2017   Ulnar neuropathy at elbow, 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   Essential hypertension 01/12/2017   Groin  strain 11/18/2016   Irregular bowel habits 11/18/2016   Rheumatoid arthritis (HCC) 01/22/2016   Chronic insomnia 12/31/2015   Chronic pain 12/31/2015   Esophageal reflux 12/31/2015   Hypercholesteremia 12/31/2015   Chronic arthritis 12/31/2015   Wrist pain, right 12/01/2015   IT band syndrome 05/06/2014   Cervical radiculopathy 07/14/2011   Arthritis pain, hand 06/02/2011   Osteoarthritis of CMC joint of thumb 06/02/2011    PCP: Tura Gaines, MD  REFERRING PROVIDER: Claybon Cuna, MD  REFERRING DIAG:  Diagnosis  M43.16 (ICD-10-CM) - Spondylolisthesis at L4-L5 level   THERAPY DIAG:  Muscle weakness  (generalized)  Pain in right hip  Radiculopathy, lumbosacral region  Cramp and spasm  Other low back pain  Rationale for Evaluation and Treatment: Rehabilitation  ONSET DATE: October 2024  SUBJECTIVE:   SUBJECTIVE STATEMENT Patient reports some improvement since the dose of oral steroids.  PERTINENT HISTORY: RA, IT band syndrome, ACDF of C5-7 in 1991, Lumbar Laminectomy L5-S1 October 2023  PAIN:  Are you having pain? Yes: NPRS scale: 2-4/10 Pain location: low back Pain description: shooting, sharp, sometimes aching and radiating down Rt LE Aggravating factors: standing and walking Relieving factors: sitting  PRECAUTIONS: None  RED FLAGS: None   WEIGHT BEARING RESTRICTIONS: No  FALLS:  Has patient fallen in last 6 months? No  LIVING ENVIRONMENT: Lives with: lives with their spouse Lives in: House/apartment Stairs:  one story home Has following equipment at home: Grab bars  OCCUPATION: retired, formerly a Designer, television/film set  PLOF: Independent and Leisure: yard work Standing: 3-5 minutes static, moving around 5-8 min   The Patient-Specific Functional Scale  Initial:  I am going to ask you to identify up to 3 important activities that you are unable to do or are having difficulty with as a result of this problem.  Today are there any activities that you are unable to do or having difficulty with because of this?  (Patient shown scale and patient rated each activity)  Follow up: When you first came in you had difficulty performing these activities.  Today do you still have difficulty?  Patient-Specific activity scoring scheme (Point to one number): PSFS  0 1 2 3 4 5 6 7 8 9  10 Unable                                                                                                          Able to perform To perform                                                                                                    activity at the same Activity  Level as  before                                                                                                                       Injury or problem  Activity  Unloading dishwasher Initial: 5/10   follow up on 01/26/24: 6-7/10  2.   Standing for  shower  Initial:  3-4/10                     follow up on 01/26/24:  7/10   PATIENT GOALS: To be able to walk/stand without hurting, avoid surgery for now.   NEXT MD VISIT: Pt returns to Dr Dannis Dy on 02/01/24  OBJECTIVE:  Note: Objective measures were completed at Evaluation unless otherwise noted.  DIAGNOSTIC FINDINGS:  Right Hip Radiograph on 10/06/2023: FINDINGS: There is no evidence of hip fracture or dislocation. No significant arthropathy. No bony lesions identified. The bony pelvis is unremarkable.  Lumbar MRI on 11/23/23:  1. 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 2. Interval left laminectomy at L5-S1 without residual lateral recess stenosis. Unchanged severe left neural foraminal stenosis. 3. 3.2 cm abdominal aortic aneurysm. Recommend surveillance ultrasound in 3 years.  PATIENT SURVEYS:  Eval: Modified Oswestry: 19/50= 38% limitation  01/26/2024:  Modified Oswestry Low Back Pain Disability Questionnaire: 18 / 50 = 36.0 %  COGNITION: Overall cognitive status: Within functional limits for tasks assessed     SENSATION: Reports some numbness and tingling down his right leg, especially with prolonged standing.  MUSCLE LENGTH: Hamstrings: tightness noted bilat  POSTURE: rounded shoulders, forward head, and flexed trunk   PALPATION: Tenderness to palpation Rt gluteals and Lt lumbar spine  LOWER EXTREMITY ROM: Lumbar A/ROM: flexion full without pain, extension to neutral with Rt LE pain, Lt sidebending full without pain, Rt sidebending to neutral with pain  Rt hip flexibility: unable to perform figure 4 on Rt in sitting  LOWER EXTREMITY MMT:  Eval:   Right hip strength grossly 4-/5 Right hamstring  strength is 4/5 Right quad strength is 5-/5 Left LE strength is WFL   FUNCTIONAL ASSESSMENTS:  01/05/2024: 6 minute walk test:  929 ft with pain of 6/10 (patient requires standing recovery periods)  GAIT: Distance walked: >500 ft Assistive device utilized: None Level of assistance: Complete Independence Comments: Patient with antalgic gait with decreased stance time on right LE  TODAY'S TREATMENT  02/06/2024: Recumbent bike level 3 x6 min with PT present to discuss status Standing shoulder rows with blue tband 2x10 Standing shoulder extension with blue tband 2x10 Standing paloff press with blue tband x20 bilat Hip Matrix with 25#:  hip abduction and hip extension.  1x10 each bilat Supine hamstring stretch with strap 2x20 sec bilat Supine IT band stretch with strap 2x20 sec bilat Supine hip adduction stretch with strap 2x20 sec bilat Quadruped with 8 lb weights for wrist support cat/camel x 10 Quadruped with alternating arms x 10 each arm (20 alternating) Quadruped with alternating legs x 10 each leg (20 alternating) Quadruped fire hydrant x10 bilat Supine clamshell with yellow loop 2x10   02/02/2024: (patient 17 min late for appt- got appt time mixed up) Standing hamstring stretch 2 x 30 sec Standing quad/hip flexor stretch 2 x 30 sec  Seated piriformis stretch 2 x 30 sec Quadruped with 8 lb weights for wrist support cat/camel x 10 Quadruped with alternating arms x 10 each arm (20 alternating) Quadruped with alternating legs x 10 each leg (20 alternating)  01/30/2024: Recumbent bike level 3 x6 min with PT present to discuss status Standing shoulder rows with blue tband 2x10 Standing shoulder extension with blue tband 2x10 Standing paloff press with blue tband x20 bilat Hip Matrix with 25#:  hip abduction and hip extension.  2x10 each bilat Supine  hamstring stretch with strap 2x20 sec bilat Supine IT band stretch with strap 2x20 sec bilat Supine clamshell with yellow loop 2x10 Supine marching with transversus abdominus contraction and yellow loop around knees 2x10 Prone hip extension 2x5 bilat   PATIENT EDUCATION:  Education details: Research scientist (medical) Person educated: Patient Education method: Explanation, Facilities manager, and Handouts Education comprehension: verbalized understanding  HOME EXERCISE PROGRAM: Access Code: [email or web address] URL: [email or web address] Date: 01/09/2024 Prepared by: Concha Deed  Exercises - Seated Scapular Retraction  - 1 x daily - 7 x weekly - 2 sets - 10 reps - Seated Hamstring Stretch  - 1 x daily - 7 x weekly - 2 reps - 20 sec hold - Clamshell  - 1 x daily - 7 x weekly - 2 sets - 10 reps - Seated Figure 4 Piriformis Stretch  - 1 x daily - 7 x weekly - 3 sets - 10 reps - Supine Figure 4 Piriformis Stretch  - 3 x daily - 7 x weekly - 1 sets - 3 reps - 20 hold - Hooklying Single Knee to Chest Stretch  - 3 x daily - 7 x weekly - 1 sets - 3 reps - 20 hold - Supine Double Knee to Chest  - 3 x daily - 7 x weekly - 1 sets - 3 reps - 20 hold - Hip Hiking on Step  - 1 x daily - 3 x weekly - 2 sets - 10 reps - Standing Isometric Hip Abduction with Knee at 90 at Wall  - 1 x daily - 3 x weekly - 2 sets - 10 reps - 5 sec hold  ASSESSMENT:  CLINICAL IMPRESSION: Mr Pichardo presents to skilled PT reporting that the steroids have helped a little.  Patient able to progress through session.  He continues to have weakness with glute med/max.  Patient continues to have most pain when standing and weight bearing through his right leg.  Patient continues to require only minimal cuing with exercises for improved technique.  Patient continues to require skilled PT to progress towards goal related activities.  OBJECTIVE IMPAIRMENTS: decreased balance, difficulty walking,  decreased strength, increased muscle spasms, impaired  flexibility, postural dysfunction, and pain.     GOALS: Goals reviewed with patient? Yes  SHORT TERM GOALS: Target date: 01/18/24 Patient will be independent with initial HEP. Baseline: Goal status: Met on 01/05/24  2.  Patient will report at least a 25% improvement in Rt LE and low back pain with standing  Baseline:  Goal status: Met on 01/05/24  3.  Stand for > or = to 5-7 minutes for self-care tasks without limitation  Baseline: 3-5 minutes  Goal status: Met on 01/26/24   LONG TERM GOALS: Target date: 02/14/2024   Patient will be independent with advanced HEP to allow for self progression after discharge. Baseline:  Goal status: Ongoing  2.  Modified Oswestry to < or = to 10/50 (20%) for improved function Baseline: 19/50 (38%) Goal status: Ongoing (see above)  3.  Improve PSFS to > or = to 7/10 for unloading the dishwasher due to reduced pain Baseline: 5/10 Goal status: Met on 01/26/24  4.  Improve PSFS to > or = to 5-6/10 for taking a shower due to reduced pain Baseline: 3-4/10 Goal status: Met on 01/26/24  5. Report > or = to 40% reduction in low back and Rt LE symptoms with standing and walking   Baseline: 7-8/10  Goal Status: Ongoing   PLAN:  PT FREQUENCY: 2x/week  PT DURATION: 8 weeks  PLANNED INTERVENTIONS: 97164- PT Re-evaluation, 97110-Therapeutic exercises, 97530- Therapeutic activity, 97112- Neuromuscular re-education, 97535- Self Care, 16109- Manual therapy, [id number]- Gait training, [phone]- Canalith repositioning, [id number]- Aquatic Therapy, 97014- Electrical stimulation (unattended), [id number]- Electrical stimulation (manual), 97016- Vasopneumatic device, [id number]- Ultrasound, [id number]- Traction (mechanical), Patient/Family education, Balance training, Stair training, Taping, Dry Needling, Joint mobilization, Joint manipulation, Spinal manipulation, Spinal mobilization, Cryotherapy, Moist heat, and Biofeedback  PLAN FOR NEXT SESSION: Resume more standing exercises as pain  allows, flexion based exercise, LE strength in sitting or supine/side lying as tolerated, Glute med strength, standing balance when able to progress    Robyne Christen, PT, DPT 02/06/24, 12:35 PM  [facility] [address] [place], [place] [address] Phone # [phone] Fax [phone]

## 2024-02-08 ENCOUNTER — Encounter: Payer: Self-pay | Admitting: Family Medicine

## 2024-02-08 NOTE — Addendum Note (Signed)
 Addended by: Rodgers Clack on: 02/08/2024 10:43 AM   Modules accepted: Level of Service

## 2024-02-09 ENCOUNTER — Encounter: Payer: Self-pay | Admitting: Rehabilitative and Restorative Service Providers"

## 2024-02-09 ENCOUNTER — Ambulatory Visit: Admitting: Rehabilitative and Restorative Service Providers"

## 2024-02-09 DIAGNOSIS — M5417 Radiculopathy, lumbosacral region: Secondary | ICD-10-CM

## 2024-02-09 DIAGNOSIS — M25551 Pain in right hip: Secondary | ICD-10-CM

## 2024-02-09 DIAGNOSIS — R252 Cramp and spasm: Secondary | ICD-10-CM

## 2024-02-09 DIAGNOSIS — M6281 Muscle weakness (generalized): Secondary | ICD-10-CM | POA: Diagnosis not present

## 2024-02-09 DIAGNOSIS — M5459 Other low back pain: Secondary | ICD-10-CM

## 2024-02-09 NOTE — Therapy (Signed)
 OUTPATIENT PHYSICAL THERAPY LUMBAR TREATMENT   Patient Name: Isaiah Price MRN: [id number] DOB:1951-07-25, 73 y.o., male Today's Date: 02/09/2024     END OF SESSION:  PT End of Session - 02/09/24 1236     Visit Number 14    Date for PT Re-Evaluation 02/15/24    Authorization Type Medicare/ BCBC- KX Modifier Needed    Progress Note Due on Visit 20    PT Start Time 1230    PT Stop Time 1310    PT Time Calculation (min) 40 min    Activity Tolerance Patient tolerated treatment well    Behavior During Therapy WFL for tasks assessed/performed               Past Medical History:  Diagnosis Date   Achilles tendinitis, left leg    At risk for sleep apnea    STOP--BANG SCORE= 5  (routed to pt's pcp in epic 11-13-2018)   Cervicogenic headache 06/08/2021   Chronic insomnia 12/31/2015   Chronic neck pain    Congenital pectus excavatum    per pt had pulmonary test ,  60% restrictive by states asymptomatic   DDD (degenerative disc disease), cervical    Epicondylitis, lateral, right    Gait abnormality 06/08/2021   GERD (gastroesophageal reflux disease)    History of exercise intolerance    09-02-2017  ETT,  negative for ischemia   History of pericarditis 1985   HTN (hypertension)    Hypercholesteremia 12/31/2015   Iritis    IT band syndrome 05/06/2014   Lower urinary tract symptoms (LUTS)    Neuropathy    bilateral arms with tingling and numbness at times due to cervical neck   OA (osteoarthritis)    Polyarthralgia    Prostate cancer [facility])    urologist-  dr Rozanne Corners--- Stage T1c, Gleason 3+4, 06/08/21 recurrent prostate cancer   Rheumatoid arthritis [facility])    rheumatologist-- dr Ebbie Goldmann--- seropositive,multiple sites   Tendon disorder 2022   torn tendon, right arm   Wears glasses    Past Surgical History:  Procedure Laterality Date   ANTERIOR CERVICAL DECOMP/DISCECTOMY FUSION  9   dr Cipriano Creeks   C5 -- 7  (per pt fusion failed)   CARPAL TUNNEL RELEASE Right done at  same time of CMC fusion   CARPOMETACARPAL (CMC) FUSION OF THUMB Bilateral 2015 and 2014  approx.   CATARACT EXTRACTION W/ INTRAOCULAR LENS  IMPLANT, BILATERAL  2013   LYMPHADENECTOMY Bilateral 11/16/2018   Procedure: LYMPHADENECTOMY, PELVIC;  Surgeon: Florencio Hunting, MD;  Location: [facility];  Service: Urology;  Laterality: Bilateral;   ROBOT ASSISTED LAPAROSCOPIC RADICAL PROSTATECTOMY N/A 11/16/2018   Procedure: XI ROBOTIC ASSISTED LAPAROSCOPIC RADICAL PROSTATECTOMY LEVEL 2;  Surgeon: Florencio Hunting, MD;  Location: [facility];  Service: Urology;  Laterality: N/A;   SHOULDER SURGERY Left age 67   TONSILLECTOMY  child   Patient Active Problem List   Diagnosis Date Noted   Spinal stenosis of lumbar region 01/11/2024   Dyslipidemia 10/02/2023   Aortic root enlargement (HCC) 10/02/2023   Elevated coronary artery calcium  score 10/02/2023   Sciatic pain, right 09/07/2023   Palpitations 09/27/2021   Cervicogenic headache 06/08/2021   Gait abnormality 06/08/2021   Prostate cancer (HCC) 11/16/2018   Malignant neoplasm of prostate (HCC) 09/18/2018   Daytime somnolence 08/04/2017   Dyspnea on exertion 08/04/2017   Exercise intolerance 08/04/2017   Need for influenza vaccination 07/01/2017   Ulnar neuropathy at elbow, 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   Essential hypertension 01/12/2017   Groin  strain 11/18/2016   Irregular bowel habits 11/18/2016   Rheumatoid arthritis (HCC) 01/22/2016   Chronic insomnia 12/31/2015   Chronic pain 12/31/2015   Esophageal reflux 12/31/2015   Hypercholesteremia 12/31/2015   Chronic arthritis 12/31/2015   Wrist pain, right 12/01/2015   IT band syndrome 05/06/2014   Cervical radiculopathy 07/14/2011   Arthritis pain, hand 06/02/2011   Osteoarthritis of CMC joint of thumb 06/02/2011    PCP: Tura Gaines, MD  REFERRING PROVIDER: Claybon Cuna, MD  REFERRING DIAG:  Diagnosis  M43.16 (ICD-10-CM) - Spondylolisthesis at L4-L5 level   THERAPY DIAG:  Muscle weakness  (generalized)  Pain in right hip  Radiculopathy, lumbosacral region  Cramp and spasm  Other low back pain  Rationale for Evaluation and Treatment: Rehabilitation  ONSET DATE: October 2024  SUBJECTIVE:   SUBJECTIVE STATEMENT Patient reports over the weekend, he started having pain again.  PERTINENT HISTORY: RA, IT band syndrome, ACDF of C5-7 in 1991, Lumbar Laminectomy L5-S1 October 2023  PAIN:  Are you having pain? Yes: NPRS scale: 4-5/10 Pain location: low back Pain description: shooting, sharp, sometimes aching and radiating down Rt LE Aggravating factors: standing and walking Relieving factors: sitting  PRECAUTIONS: None  RED FLAGS: None   WEIGHT BEARING RESTRICTIONS: No  FALLS:  Has patient fallen in last 6 months? No  LIVING ENVIRONMENT: Lives with: lives with their spouse Lives in: House/apartment Stairs:  one story home Has following equipment at home: Grab bars  OCCUPATION: retired, formerly a Designer, television/film set  PLOF: Independent and Leisure: yard work Standing: 3-5 minutes static, moving around 5-8 min   The Patient-Specific Functional Scale  Initial:  I am going to ask you to identify up to 3 important activities that you are unable to do or are having difficulty with as a result of this problem.  Today are there any activities that you are unable to do or having difficulty with because of this?  (Patient shown scale and patient rated each activity)  Follow up: When you first came in you had difficulty performing these activities.  Today do you still have difficulty?  Patient-Specific activity scoring scheme (Point to one number): PSFS  0 1 2 3 4 5 6 7 8 9  10 Unable                                                                                                          Able to perform To perform                                                                                                    activity at the same Activity  Level as  before                                                                                                                       Injury or problem  Activity  Unloading dishwasher Initial: 5/10   follow up on 01/26/24: 6-7/10  2.   Standing for  shower  Initial:  3-4/10                     follow up on 01/26/24:  7/10   PATIENT GOALS: To be able to walk/stand without hurting, avoid surgery for now.   NEXT MD VISIT: Pt returns to Dr Dannis Dy on 02/01/24  OBJECTIVE:  Note: Objective measures were completed at Evaluation unless otherwise noted.  DIAGNOSTIC FINDINGS:  Right Hip Radiograph on 10/06/2023: FINDINGS: There is no evidence of hip fracture or dislocation. No significant arthropathy. No bony lesions identified. The bony pelvis is unremarkable.  Lumbar MRI on 11/23/23:  1. 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 2. Interval left laminectomy at L5-S1 without residual lateral recess stenosis. Unchanged severe left neural foraminal stenosis. 3. 3.2 cm abdominal aortic aneurysm. Recommend surveillance ultrasound in 3 years.  PATIENT SURVEYS:  Eval: Modified Oswestry: 19/50= 38% limitation  01/26/2024:  Modified Oswestry Low Back Pain Disability Questionnaire: 18 / 50 = 36.0 %  COGNITION: Overall cognitive status: Within functional limits for tasks assessed     SENSATION: Reports some numbness and tingling down his right leg, especially with prolonged standing.  MUSCLE LENGTH: Hamstrings: tightness noted bilat  POSTURE: rounded shoulders, forward head, and flexed trunk   PALPATION: Tenderness to palpation Rt gluteals and Lt lumbar spine  LOWER EXTREMITY ROM: Lumbar A/ROM: flexion full without pain, extension to neutral with Rt LE pain, Lt sidebending full without pain, Rt sidebending to neutral with pain  Rt hip flexibility: unable to perform figure 4 on Rt in sitting  LOWER EXTREMITY MMT:  Eval:   Right hip strength grossly 4-/5 Right hamstring  strength is 4/5 Right quad strength is 5-/5 Left LE strength is WFL   FUNCTIONAL ASSESSMENTS:  01/05/2024: 6 minute walk test:  929 ft with pain of 6/10 (patient requires standing recovery periods)  GAIT: Distance walked: >500 ft Assistive device utilized: None Level of assistance: Complete Independence Comments: Patient with antalgic gait with decreased stance time on right LE  TODAY'S TREATMENT  02/09/2024: Nustep level 5 (LE only) x7 min with PT present to discuss status Seated 3 way blue pball rollout x10 each Seated hamstring stretch 2x20 sec bilat Seated piriformis stretch 2x20 sec bilat Standing shoulder rows with blue tband 2x10 Standing shoulder extension with blue tband 2x10 Standing paloff press with blue tband x20 bilat Quadruped with 8 lb weights for wrist support cat/camel x 10 Quadruped with alternating arms x 10 each arm (20 alternating) Quadruped with alternating legs x 10 each leg (20 alternating) Quadruped fire hydrant 2x10 bilat Prone hip extension 2x10 bilat Supine clamshell with red loop 2x10   02/06/2024: Recumbent bike level 3 x6 min with PT present to discuss status Standing shoulder rows with blue tband 2x10 Standing shoulder extension with blue tband 2x10 Standing paloff press with blue tband x20 bilat Hip Matrix with 25#:  hip abduction and hip extension.  1x10 each bilat Supine hamstring stretch with strap 2x20 sec bilat Supine IT band stretch with strap 2x20 sec bilat Supine hip adduction stretch with strap 2x20 sec bilat Quadruped with 8 lb weights for wrist support cat/camel x 10 Quadruped with alternating arms x 10 each arm (20 alternating) Quadruped with alternating legs x 10 each leg (20 alternating) Quadruped fire hydrant x10 bilat Supine clamshell with yellow loop 2x10   02/02/2024: (patient 17 min late for  appt- got appt time mixed up) Standing hamstring stretch 2 x 30 sec Standing quad/hip flexor stretch 2 x 30 sec  Seated piriformis stretch 2 x 30 sec Quadruped with 8 lb weights for wrist support cat/camel x 10 Quadruped with alternating arms x 10 each arm (20 alternating) Quadruped with alternating legs x 10 each leg (20 alternating)    PATIENT EDUCATION:  Education details: Research scientist (medical) Person educated: Patient Education method: Explanation, Demonstration, and Handouts Education comprehension: verbalized understanding  HOME EXERCISE PROGRAM: Access Code: [email or web address] URL: [email or web address] Date: 01/09/2024 Prepared by: Concha Deed  Exercises - Seated Scapular Retraction  - 1 x daily - 7 x weekly - 2 sets - 10 reps - Seated Hamstring Stretch  - 1 x daily - 7 x weekly - 2 reps - 20 sec hold - Clamshell  - 1 x daily - 7 x weekly - 2 sets - 10 reps - Seated Figure 4 Piriformis Stretch  - 1 x daily - 7 x weekly - 3 sets - 10 reps - Supine Figure 4 Piriformis Stretch  - 3 x daily - 7 x weekly - 1 sets - 3 reps - 20 hold - Hooklying Single Knee to Chest Stretch  - 3 x daily - 7 x weekly - 1 sets - 3 reps - 20 hold - Supine Double Knee to Chest  - 3 x daily - 7 x weekly - 1 sets - 3 reps - 20 hold - Hip Hiking on Step  - 1 x daily - 3 x weekly - 2 sets - 10 reps - Standing Isometric Hip Abduction with Knee at 90 at Wall  - 1 x daily - 3 x weekly - 2 sets - 10 reps - 5 sec hold  ASSESSMENT:  CLINICAL IMPRESSION: Mr Meader presents to skilled PT reporting he continues to have increased pain and is waiting to hear from Dr Ellery Guthrie.  Patient states that he is good with discharge next week, when the current certification expires until he can follow back up with Dr Ellery Guthrie to see if a shot or surgery is required.  Patient continues to progress with strengthening and  core stability during session.  Patient only requires minimal cuing during session for improved technique, especially with newer  exercises.  Patient on track for discharge next week at end of cert.  OBJECTIVE IMPAIRMENTS: decreased balance, difficulty walking, decreased strength, increased muscle spasms, impaired flexibility, postural dysfunction, and pain.     GOALS: Goals reviewed with patient? Yes  SHORT TERM GOALS: Target date: 01/18/24 Patient will be independent with initial HEP. Baseline: Goal status: Met on 01/05/24  2.  Patient will report at least a 25% improvement in Rt LE and low back pain with standing  Baseline:  Goal status: Met on 01/05/24  3.  Stand for > or = to 5-7 minutes for self-care tasks without limitation  Baseline: 3-5 minutes  Goal status: Met on 01/26/24   LONG TERM GOALS: Target date: 02/14/2024   Patient will be independent with advanced HEP to allow for self progression after discharge. Baseline:  Goal status: Ongoing  2.  Modified Oswestry to < or = to 10/50 (20%) for improved function Baseline: 19/50 (38%) Goal status: Ongoing (see above)  3.  Improve PSFS to > or = to 7/10 for unloading the dishwasher due to reduced pain Baseline: 5/10 Goal status: Met on 01/26/24  4.  Improve PSFS to > or = to 5-6/10 for taking a shower due to reduced pain Baseline: 3-4/10 Goal status: Met on 01/26/24  5. Report > or = to 40% reduction in low back and Rt LE symptoms with standing and walking   Baseline: 7-8/10  Goal Status: Ongoing   PLAN:  PT FREQUENCY: 2x/week  PT DURATION: 8 weeks  PLANNED INTERVENTIONS: 97164- PT Re-evaluation, 97110-Therapeutic exercises, 97530- Therapeutic activity, 97112- Neuromuscular re-education, 97535- Self Care, 86578- Manual therapy, [phone]- Gait training, [phone]- Canalith repositioning, [id number]- Aquatic Therapy, 97014- Electrical stimulation (unattended), [id number]- Electrical stimulation (manual), 97016- Vasopneumatic device, [id number]- Ultrasound, [id number]- Traction (mechanical), Patient/Family education, Balance training, Stair training, Taping, Dry Needling,  Joint mobilization, Joint manipulation, Spinal manipulation, Spinal mobilization, Cryotherapy, Moist heat, and Biofeedback  PLAN FOR NEXT SESSION: Plan for upcoming discharge    Arine Foley, PT, DPT 02/09/24, 1:27 PM  [facility] [address] [place], [place] [address] Phone # [phone] Fax [phone]

## 2024-02-13 ENCOUNTER — Ambulatory Visit: Admitting: Rehabilitative and Restorative Service Providers"

## 2024-02-13 ENCOUNTER — Encounter: Payer: Self-pay | Admitting: Rehabilitative and Restorative Service Providers"

## 2024-02-13 DIAGNOSIS — M6281 Muscle weakness (generalized): Secondary | ICD-10-CM | POA: Diagnosis not present

## 2024-02-13 DIAGNOSIS — R252 Cramp and spasm: Secondary | ICD-10-CM

## 2024-02-13 DIAGNOSIS — M5417 Radiculopathy, lumbosacral region: Secondary | ICD-10-CM

## 2024-02-13 DIAGNOSIS — M25551 Pain in right hip: Secondary | ICD-10-CM

## 2024-02-13 NOTE — Therapy (Signed)
 OUTPATIENT PHYSICAL THERAPY LUMBAR TREATMENT   Patient Name: Isaiah Price MRN: [id number] DOB:08-Jun-1951, 73 y.o., male Today's Date: 02/13/2024     END OF SESSION:  PT End of Session - 02/13/24 1230     Visit Number 15    Date for PT Re-Evaluation 02/15/24    Authorization Type Medicare/ BCBC- KX Modifier Needed    Progress Note Due on Visit 20    PT Start Time 1227    PT Stop Time 1305    PT Time Calculation (min) 38 min    Activity Tolerance Patient tolerated treatment well    Behavior During Therapy WFL for tasks assessed/performed               Past Medical History:  Diagnosis Date   Achilles tendinitis, left leg    At risk for sleep apnea    STOP--BANG SCORE= 5  (routed to pt's pcp in epic 11-13-2018)   Cervicogenic headache 06/08/2021   Chronic insomnia 12/31/2015   Chronic neck pain    Congenital pectus excavatum    per pt had pulmonary test ,  60% restrictive by states asymptomatic   DDD (degenerative disc disease), cervical    Epicondylitis, lateral, right    Gait abnormality 06/08/2021   GERD (gastroesophageal reflux disease)    History of exercise intolerance    09-02-2017  ETT,  negative for ischemia   History of pericarditis 1985   HTN (hypertension)    Hypercholesteremia 12/31/2015   Iritis    IT band syndrome 05/06/2014   Lower urinary tract symptoms (LUTS)    Neuropathy    bilateral arms with tingling and numbness at times due to cervical neck   OA (osteoarthritis)    Polyarthralgia    Prostate cancer [facility])    urologist-  dr Rozanne Corners--- Stage T1c, Gleason 3+4, 06/08/21 recurrent prostate cancer   Rheumatoid arthritis [facility])    rheumatologist-- dr Ebbie Goldmann--- seropositive,multiple sites   Tendon disorder 2022   torn tendon, right arm   Wears glasses    Past Surgical History:  Procedure Laterality Date   ANTERIOR CERVICAL DECOMP/DISCECTOMY FUSION  70   dr Cipriano Creeks   C5 -- 7  (per pt fusion failed)   CARPAL TUNNEL RELEASE Right done at  same time of CMC fusion   CARPOMETACARPAL (CMC) FUSION OF THUMB Bilateral 2015 and 2014  approx.   CATARACT EXTRACTION W/ INTRAOCULAR LENS  IMPLANT, BILATERAL  2013   LYMPHADENECTOMY Bilateral 11/16/2018   Procedure: LYMPHADENECTOMY, PELVIC;  Surgeon: Florencio Hunting, MD;  Location: [facility];  Service: Urology;  Laterality: Bilateral;   ROBOT ASSISTED LAPAROSCOPIC RADICAL PROSTATECTOMY N/A 11/16/2018   Procedure: XI ROBOTIC ASSISTED LAPAROSCOPIC RADICAL PROSTATECTOMY LEVEL 2;  Surgeon: Florencio Hunting, MD;  Location: [facility];  Service: Urology;  Laterality: N/A;   SHOULDER SURGERY Left age 27   TONSILLECTOMY  child   Patient Active Problem List   Diagnosis Date Noted   Spinal stenosis of lumbar region 01/11/2024   Dyslipidemia 10/02/2023   Aortic root enlargement (HCC) 10/02/2023   Elevated coronary artery calcium  score 10/02/2023   Sciatic pain, right 09/07/2023   Palpitations 09/27/2021   Cervicogenic headache 06/08/2021   Gait abnormality 06/08/2021   Prostate cancer (HCC) 11/16/2018   Malignant neoplasm of prostate (HCC) 09/18/2018   Daytime somnolence 08/04/2017   Dyspnea on exertion 08/04/2017   Exercise intolerance 08/04/2017   Need for influenza vaccination 07/01/2017   Ulnar neuropathy at elbow, 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   Essential hypertension 01/12/2017   Groin  strain 11/18/2016   Irregular bowel habits 11/18/2016   Rheumatoid arthritis (HCC) 01/22/2016   Chronic insomnia 12/31/2015   Chronic pain 12/31/2015   Esophageal reflux 12/31/2015   Hypercholesteremia 12/31/2015   Chronic arthritis 12/31/2015   Wrist pain, right 12/01/2015   IT band syndrome 05/06/2014   Cervical radiculopathy 07/14/2011   Arthritis pain, hand 06/02/2011   Osteoarthritis of CMC joint of thumb 06/02/2011    PCP: Tura Gaines, MD  REFERRING PROVIDER: Claybon Cuna, MD  REFERRING DIAG:  Diagnosis  M43.16 (ICD-10-CM) - Spondylolisthesis at L4-L5 level   THERAPY DIAG:  Muscle weakness  (generalized)  Pain in right hip  Radiculopathy, lumbosacral region  Cramp and spasm  Rationale for Evaluation and Treatment: Rehabilitation  ONSET DATE: October 2024  SUBJECTIVE:   SUBJECTIVE STATEMENT Patient states that he is still having pain of 4/10 and has not yet heard from Dr Ellery Guthrie for a follow up yet.  PERTINENT HISTORY: RA, IT band syndrome, ACDF of C5-7 in 1991, Lumbar Laminectomy L5-S1 October 2023  PAIN:  Are you having pain? Yes: NPRS scale: 4/10 Pain location: low back Pain description: shooting, sharp, sometimes aching and radiating down Rt LE Aggravating factors: standing and walking Relieving factors: sitting  PRECAUTIONS: None  RED FLAGS: None   WEIGHT BEARING RESTRICTIONS: No  FALLS:  Has patient fallen in last 6 months? No  LIVING ENVIRONMENT: Lives with: lives with their spouse Lives in: House/apartment Stairs:  one story home Has following equipment at home: Grab bars  OCCUPATION: retired, formerly a Designer, television/film set  PLOF: Independent and Leisure: yard work Standing: 3-5 minutes static, moving around 5-8 min   The Patient-Specific Functional Scale  Initial:  I am going to ask you to identify up to 3 important activities that you are unable to do or are having difficulty with as a result of this problem.  Today are there any activities that you are unable to do or having difficulty with because of this?  (Patient shown scale and patient rated each activity)  Follow up: When you first came in you had difficulty performing these activities.  Today do you still have difficulty?  Patient-Specific activity scoring scheme (Point to one number): PSFS  0 1 2 3 4 5 6 7 8 9  10 Unable                                                                                                          Able to perform To perform  activity at the  same Activity         Level as before                                                                                                                       Injury or problem  Activity  Unloading dishwasher Initial: 5/10   follow up on 01/26/24: 6-7/10  2.   Standing for  shower  Initial:  3-4/10                     follow up on 01/26/24:  7/10   PATIENT GOALS: To be able to walk/stand without hurting, avoid surgery for now.   NEXT MD VISIT: Pt returns to Dr Dannis Dy on 02/01/24  OBJECTIVE:  Note: Objective measures were completed at Evaluation unless otherwise noted.  DIAGNOSTIC FINDINGS:  Right Hip Radiograph on 10/06/2023: FINDINGS: There is no evidence of hip fracture or dislocation. No significant arthropathy. No bony lesions identified. The bony pelvis is unremarkable.  Lumbar MRI on 11/23/23:  1. 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 2. Interval left laminectomy at L5-S1 without residual lateral recess stenosis. Unchanged severe left neural foraminal stenosis. 3. 3.2 cm abdominal aortic aneurysm. Recommend surveillance ultrasound in 3 years.  PATIENT SURVEYS:  Eval: Modified Oswestry: 19/50= 38% limitation  01/26/2024:  Modified Oswestry Low Back Pain Disability Questionnaire: 18 / 50 = 36.0 %  COGNITION: Overall cognitive status: Within functional limits for tasks assessed     SENSATION: Reports some numbness and tingling down his right leg, especially with prolonged standing.  MUSCLE LENGTH: Hamstrings: tightness noted bilat  POSTURE: rounded shoulders, forward head, and flexed trunk   PALPATION: Tenderness to palpation Rt gluteals and Lt lumbar spine  LOWER EXTREMITY ROM: Lumbar A/ROM: flexion full without pain, extension to neutral with Rt LE pain, Lt sidebending full without pain, Rt sidebending to neutral with pain  Rt hip flexibility: unable to perform figure 4 on Rt in sitting  LOWER EXTREMITY MMT:  Eval:   Right hip strength  grossly 4-/5 Right hamstring strength is 4/5 Right quad strength is 5-/5 Left LE strength is WFL   FUNCTIONAL ASSESSMENTS:  01/05/2024: 6 minute walk test:  929 ft with pain of 6/10 (patient requires standing recovery periods)  GAIT: Distance walked: >500 ft Assistive device utilized: None Level of assistance: Complete Independence Comments: Patient with antalgic gait with decreased stance time on right LE  TODAY'S TREATMENT  02/13/2024: Recumbent bike level 3 x 6 min with PT present to discuss status Seated 3 way blue pball rollout x10 each Standing hamstring stretch at stairs 2x20 sec bilat Standing lat pulldown 40# 2x10  Standing shoulder rows with blue tband 2x10 Standing shoulder extension with blue tband 2x10 Standing paloff press with blue tband x20 bilat Standing 'stir the pot' with blue tband CW/CCW x10 each bilat Quadruped with 8 lb weights for wrist support cat/camel x 10 Quadruped with alternating arms x 10 each arm (20 alternating) Quadruped with alternating legs 2x10 each leg Quadruped fire hydrant 2x10 bilat Standing at barre with yellow loop around ankles:  hip abduction and hip extension.  X10 each bilat   02/09/2024: Nustep level 5 (LE only) x7 min with PT present to discuss status Seated 3 way blue pball rollout x10 each Seated hamstring stretch 2x20 sec bilat Seated piriformis stretch 2x20 sec bilat Standing shoulder rows with blue tband 2x10 Standing shoulder extension with blue tband 2x10 Standing paloff press with blue tband x20 bilat Quadruped with 8 lb weights for wrist support cat/camel x 10 Quadruped with alternating arms x 10 each arm (20 alternating) Quadruped with alternating legs x 10 each leg (20 alternating) Quadruped fire hydrant 2x10 bilat Prone hip extension 2x10 bilat Supine clamshell with red loop  2x10   02/06/2024: Recumbent bike level 3 x6 min with PT present to discuss status Standing shoulder rows with blue tband 2x10 Standing shoulder extension with blue tband 2x10 Standing paloff press with blue tband x20 bilat Hip Matrix with 25#:  hip abduction and hip extension.  1x10 each bilat Supine hamstring stretch with strap 2x20 sec bilat Supine IT band stretch with strap 2x20 sec bilat Supine hip adduction stretch with strap 2x20 sec bilat Quadruped with 8 lb weights for wrist support cat/camel x 10 Quadruped with alternating arms x 10 each arm (20 alternating) Quadruped with alternating legs x 10 each leg (20 alternating) Quadruped fire hydrant x10 bilat Supine clamshell with yellow loop 2x10   PATIENT EDUCATION:  Education details: Research scientist (medical) Person educated: Patient Education method: Explanation, Facilities manager, and Handouts Education comprehension: verbalized understanding  HOME EXERCISE PROGRAM: Access Code: [email or web address] URL: [email or web address] Date: 01/09/2024 Prepared by: Concha Deed  Exercises - Seated Scapular Retraction  - 1 x daily - 7 x weekly - 2 sets - 10 reps - Seated Hamstring Stretch  - 1 x daily - 7 x weekly - 2 reps - 20 sec hold - Clamshell  - 1 x daily - 7 x weekly - 2 sets - 10 reps - Seated Figure 4 Piriformis Stretch  - 1 x daily - 7 x weekly - 3 sets - 10 reps - Supine Figure 4 Piriformis Stretch  - 3 x daily - 7 x weekly - 1 sets - 3 reps - 20 hold - Hooklying Single Knee to Chest Stretch  - 3 x daily - 7 x weekly - 1 sets - 3 reps - 20 hold - Supine Double Knee to Chest  - 3 x daily - 7 x weekly - 1 sets - 3 reps - 20 hold - Hip Hiking on Step  - 1 x daily - 3 x weekly - 2 sets - 10 reps - Standing Isometric Hip Abduction with Knee at 90 at Wall  - 1 x daily - 3 x weekly - 2 sets - 10 reps - 5 sec hold  ASSESSMENT:  CLINICAL IMPRESSION: Mr Zempel presents to skilled PT reporting similar levels of  pain and that he still has not heard  from Dr Ellery Guthrie to schedule a follow up.  Patient able to progress with standing exercises today with resistance band.  Patient will be discharged from skilled PT next visit to follow up with MD.  OBJECTIVE IMPAIRMENTS: decreased balance, difficulty walking, decreased strength, increased muscle spasms, impaired flexibility, postural dysfunction, and pain.     GOALS: Goals reviewed with patient? Yes  SHORT TERM GOALS: Target date: 01/18/24 Patient will be independent with initial HEP. Baseline: Goal status: Met on 01/05/24  2.  Patient will report at least a 25% improvement in Rt LE and low back pain with standing  Baseline:  Goal status: Met on 01/05/24  3.  Stand for > or = to 5-7 minutes for self-care tasks without limitation  Baseline: 3-5 minutes  Goal status: Met on 01/26/24   LONG TERM GOALS: Target date: 02/14/2024   Patient will be independent with advanced HEP to allow for self progression after discharge. Baseline:  Goal status: Ongoing  2.  Modified Oswestry to < or = to 10/50 (20%) for improved function Baseline: 19/50 (38%) Goal status: Ongoing (see above)  3.  Improve PSFS to > or = to 7/10 for unloading the dishwasher due to reduced pain Baseline: 5/10 Goal status: Met on 01/26/24  4.  Improve PSFS to > or = to 5-6/10 for taking a shower due to reduced pain Baseline: 3-4/10 Goal status: Met on 01/26/24  5. Report > or = to 40% reduction in low back and Rt LE symptoms with standing and walking   Baseline: 7-8/10  Goal Status: Ongoing   PLAN:  PT FREQUENCY: 2x/week  PT DURATION: 8 weeks  PLANNED INTERVENTIONS: 97164- PT Re-evaluation, 97110-Therapeutic exercises, 97530- Therapeutic activity, 97112- Neuromuscular re-education, 97535- Self Care, 16109- Manual therapy, [phone]- Gait training, [phone]- Canalith repositioning, [id number]- Aquatic Therapy, 97014- Electrical stimulation (unattended), [id number]- Electrical stimulation (manual), 97016- Vasopneumatic device, [id number]-  Ultrasound, [id number]- Traction (mechanical), Patient/Family education, Balance training, Stair training, Taping, Dry Needling, Joint mobilization, Joint manipulation, Spinal manipulation, Spinal mobilization, Cryotherapy, Moist heat, and Biofeedback  PLAN FOR NEXT SESSION: Plan for discharge    Claudell Wohler, PT, DPT 02/13/24, 1:24 PM  [facility] [address], [address] [place], [place] [address] Phone # [phone] Fax [phone]

## 2024-02-15 ENCOUNTER — Other Ambulatory Visit: Payer: Self-pay | Admitting: Family Medicine

## 2024-02-15 ENCOUNTER — Ambulatory Visit: Payer: Self-pay | Admitting: Rehabilitative and Restorative Service Providers"

## 2024-02-15 ENCOUNTER — Encounter: Payer: Self-pay | Admitting: Family Medicine

## 2024-02-15 ENCOUNTER — Encounter: Payer: Self-pay | Admitting: Rehabilitative and Restorative Service Providers"

## 2024-02-15 DIAGNOSIS — M5417 Radiculopathy, lumbosacral region: Secondary | ICD-10-CM

## 2024-02-15 DIAGNOSIS — M6281 Muscle weakness (generalized): Secondary | ICD-10-CM

## 2024-02-15 DIAGNOSIS — M25551 Pain in right hip: Secondary | ICD-10-CM

## 2024-02-15 DIAGNOSIS — R252 Cramp and spasm: Secondary | ICD-10-CM

## 2024-02-15 DIAGNOSIS — M48061 Spinal stenosis, lumbar region without neurogenic claudication: Secondary | ICD-10-CM

## 2024-02-15 NOTE — Therapy (Signed)
 OUTPATIENT PHYSICAL THERAPY LUMBAR TREATMENT AND DISCHARGE SUMMARY   Patient Name: Isaiah Price MRN: [id number] DOB:1951/08/29, 73 y.o., male Today's Date: 02/15/2024     END OF SESSION:  PT End of Session - 02/15/24 1149     Visit Number 16    Date for PT Re-Evaluation 02/15/24    Authorization Type Medicare/ BCBC- KX Modifier Needed    Progress Note Due on Visit 20    PT Start Time 1145    PT Stop Time 1225    PT Time Calculation (min) 40 min    Activity Tolerance Patient tolerated treatment well    Behavior During Therapy WFL for tasks assessed/performed               Past Medical History:  Diagnosis Date   Achilles tendinitis, left leg    At risk for sleep apnea    STOP--BANG SCORE= 5  (routed to pt's pcp in epic 11-13-2018)   Cervicogenic headache 06/08/2021   Chronic insomnia 12/31/2015   Chronic neck pain    Congenital pectus excavatum    per pt had pulmonary test ,  60% restrictive by states asymptomatic   DDD (degenerative disc disease), cervical    Epicondylitis, lateral, right    Gait abnormality 06/08/2021   GERD (gastroesophageal reflux disease)    History of exercise intolerance    09-02-2017  ETT,  negative for ischemia   History of pericarditis 1985   HTN (hypertension)    Hypercholesteremia 12/31/2015   Iritis    IT band syndrome 05/06/2014   Lower urinary tract symptoms (LUTS)    Neuropathy    bilateral arms with tingling and numbness at times due to cervical neck   OA (osteoarthritis)    Polyarthralgia    Prostate cancer [facility])    urologist-  dr Rozanne Corners--- Stage T1c, Gleason 3+4, 06/08/21 recurrent prostate cancer   Rheumatoid arthritis [facility])    rheumatologist-- dr Ebbie Goldmann--- seropositive,multiple sites   Tendon disorder 2022   torn tendon, right arm   Wears glasses    Past Surgical History:  Procedure Laterality Date   ANTERIOR CERVICAL DECOMP/DISCECTOMY FUSION  45   dr Cipriano Creeks   C5 -- 7  (per pt fusion failed)   CARPAL TUNNEL  RELEASE Right done at same time of CMC fusion   CARPOMETACARPAL (CMC) FUSION OF THUMB Bilateral 2015 and 2014  approx.   CATARACT EXTRACTION W/ INTRAOCULAR LENS  IMPLANT, BILATERAL  2013   LYMPHADENECTOMY Bilateral 11/16/2018   Procedure: LYMPHADENECTOMY, PELVIC;  Surgeon: Florencio Hunting, MD;  Location: [facility];  Service: Urology;  Laterality: Bilateral;   ROBOT ASSISTED LAPAROSCOPIC RADICAL PROSTATECTOMY N/A 11/16/2018   Procedure: XI ROBOTIC ASSISTED LAPAROSCOPIC RADICAL PROSTATECTOMY LEVEL 2;  Surgeon: Florencio Hunting, MD;  Location: [facility];  Service: Urology;  Laterality: N/A;   SHOULDER SURGERY Left age 41   TONSILLECTOMY  child   Patient Active Problem List   Diagnosis Date Noted   Spinal stenosis of lumbar region 01/11/2024   Dyslipidemia 10/02/2023   Aortic root enlargement (HCC) 10/02/2023   Elevated coronary artery calcium  score 10/02/2023   Sciatic pain, right 09/07/2023   Palpitations 09/27/2021   Cervicogenic headache 06/08/2021   Gait abnormality 06/08/2021   Prostate cancer (HCC) 11/16/2018   Malignant neoplasm of prostate (HCC) 09/18/2018   Daytime somnolence 08/04/2017   Dyspnea on exertion 08/04/2017   Exercise intolerance 08/04/2017   Need for influenza vaccination 07/01/2017   Ulnar neuropathy at elbow, 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   Essential hypertension 01/12/2017  Groin strain 11/18/2016   Irregular bowel habits 11/18/2016   Rheumatoid arthritis (HCC) 01/22/2016   Chronic insomnia 12/31/2015   Chronic pain 12/31/2015   Esophageal reflux 12/31/2015   Hypercholesteremia 12/31/2015   Chronic arthritis 12/31/2015   Wrist pain, right 12/01/2015   IT band syndrome 05/06/2014   Cervical radiculopathy 07/14/2011   Arthritis pain, hand 06/02/2011   Osteoarthritis of CMC joint of thumb 06/02/2011    PCP: Tura Gaines, MD  REFERRING PROVIDER: Claybon Cuna, MD  REFERRING DIAG:  Diagnosis  M43.16 (ICD-10-CM) - Spondylolisthesis at L4-L5 level   THERAPY DIAG:   Muscle weakness (generalized)  Pain in right hip  Radiculopathy, lumbosacral region  Cramp and spasm  Rationale for Evaluation and Treatment: Rehabilitation  ONSET DATE: October 2024  SUBJECTIVE:   SUBJECTIVE STATEMENT Patient reports that Dr Ruthann Cover CMA advised him that he won't be able to receive another injection for 40 days.  He states that he is trying to schedule a follow up appointment. States pain is still a 4-5/10 and it wakes him up at night.  PERTINENT HISTORY: RA, IT band syndrome, ACDF of C5-7 in 1991, Lumbar Laminectomy L5-S1 October 2023  PAIN:  Are you having pain? Yes: NPRS scale: 4-5/10 Pain location: low back Pain description: shooting, sharp, sometimes aching and radiating down Rt LE Aggravating factors: standing and walking Relieving factors: sitting  PRECAUTIONS: None  RED FLAGS: None   WEIGHT BEARING RESTRICTIONS: No  FALLS:  Has patient fallen in last 6 months? No  LIVING ENVIRONMENT: Lives with: lives with their spouse Lives in: House/apartment Stairs:  one story home Has following equipment at home: Grab bars  OCCUPATION: retired, formerly a Designer, television/film set  PLOF: Independent and Leisure: yard work Standing: 3-5 minutes static, moving around 5-8 min   The Patient-Specific Functional Scale  Initial:  I am going to ask you to identify up to 3 important activities that you are unable to do or are having difficulty with as a result of this problem.  Today are there any activities that you are unable to do or having difficulty with because of this?  (Patient shown scale and patient rated each activity)  Follow up: When you first came in you had difficulty performing these activities.  Today do you still have difficulty?  Patient-Specific activity scoring scheme (Point to one number): PSFS  0 1 2 3 4 5 6 7 8 9  10 Unable                                                                                                          Able to  perform To perform  activity at the same Activity         Level as before                                                                                                                       Injury or problem  Activity  Unloading dishwasher Initial: 5/10   follow up on 01/26/24: 6-7/10  2.   Standing for  shower  Initial:  3-4/10                     follow up on 01/26/24:  7/10   PATIENT GOALS: To be able to walk/stand without hurting, avoid surgery for now.   NEXT MD VISIT: Pt returns to Dr Dannis Dy on 02/01/24  OBJECTIVE:  Note: Objective measures were completed at Evaluation unless otherwise noted.  DIAGNOSTIC FINDINGS:  Right Hip Radiograph on 10/06/2023: FINDINGS: There is no evidence of hip fracture or dislocation. No significant arthropathy. No bony lesions identified. The bony pelvis is unremarkable.  Lumbar MRI on 11/23/23:  1. 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 2. Interval left laminectomy at L5-S1 without residual lateral recess stenosis. Unchanged severe left neural foraminal stenosis. 3. 3.2 cm abdominal aortic aneurysm. Recommend surveillance ultrasound in 3 years.  PATIENT SURVEYS:  Eval: Modified Oswestry: 19/50= 38% limitation  01/26/2024:  Modified Oswestry Low Back Pain Disability Questionnaire: 18 / 50 = 36.0 %  02/15/2024:  Modified Oswestry Low Back Pain Disability Questionnaire: 23 / 50 = 46.0 %  COGNITION: Overall cognitive status: Within functional limits for tasks assessed     SENSATION: Reports some numbness and tingling down his right leg, especially with prolonged standing.  MUSCLE LENGTH: Hamstrings: tightness noted bilat  POSTURE: rounded shoulders, forward head, and flexed trunk   PALPATION: Tenderness to palpation Rt gluteals and Lt lumbar spine  LOWER EXTREMITY ROM: Lumbar A/ROM: flexion full without pain,  extension to neutral with Rt LE pain, Lt sidebending full without pain, Rt sidebending to neutral with pain  Rt hip flexibility: unable to perform figure 4 on Rt in sitting  LOWER EXTREMITY MMT:  Eval:   Right hip strength grossly 4-/5 Right hamstring strength is 4/5 Right quad strength is 5-/5 Left LE strength is WFL   FUNCTIONAL ASSESSMENTS:  01/05/2024: 6 minute walk test:  929 ft with pain of 6/10 (patient requires standing recovery periods)  02/15/2024: 6 minute walk test: 1,043 ft with pain of 5/10  GAIT: Distance walked: >500 ft Assistive device utilized: None Level of assistance: Complete Independence Comments: Patient with antalgic gait with decreased stance time on right LE  TODAY'S TREATMENT  Recumbent bike level 3 x 6 min with PT present to discuss status 6 minute walk test: 1,043 ft with pain of 5/10 Seated 3 way blue pball rollout x10 each Standing hamstring stretch at stairs 2x20 sec bilat Standing lat pulldown 40# 2x10  Standing shoulder rows with blue tband 2x10 Standing shoulder extension with blue tband 2x10 Standing paloff press with blue tband x20 bilat Standing 'stir the pot' with blue tband CW/CCW x10 each bilat   02/13/2024: Recumbent bike level 3 x 6 min with PT present to discuss status Seated 3 way blue pball rollout x10 each Standing hamstring stretch at stairs 2x20 sec bilat Standing lat pulldown 40# 2x10  Standing shoulder rows with blue tband 2x10 Standing shoulder extension with blue tband 2x10 Standing paloff press with blue tband x20 bilat Standing 'stir the pot' with blue tband CW/CCW x10 each bilat Quadruped with 8 lb weights for wrist support cat/camel x 10 Quadruped with alternating arms x 10 each arm (20 alternating) Quadruped with alternating legs 2x10 each leg Quadruped fire hydrant 2x10 bilat Standing  at barre with yellow loop around ankles:  hip abduction and hip extension.  X10 each bilat   02/09/2024: Nustep level 5 (LE only) x7 min with PT present to discuss status Seated 3 way blue pball rollout x10 each Seated hamstring stretch 2x20 sec bilat Seated piriformis stretch 2x20 sec bilat Standing shoulder rows with blue tband 2x10 Standing shoulder extension with blue tband 2x10 Standing paloff press with blue tband x20 bilat Quadruped with 8 lb weights for wrist support cat/camel x 10 Quadruped with alternating arms x 10 each arm (20 alternating) Quadruped with alternating legs x 10 each leg (20 alternating) Quadruped fire hydrant 2x10 bilat Prone hip extension 2x10 bilat Supine clamshell with red loop 2x10   PATIENT EDUCATION:  Education details: Research scientist (medical) Person educated: Patient Education method: Explanation, Facilities manager, and Handouts Education comprehension: verbalized understanding  HOME EXERCISE PROGRAM: Access Code: [email or web address] URL: [email or web address] Date: 01/09/2024 Prepared by: Concha Deed  Exercises - Seated Scapular Retraction  - 1 x daily - 7 x weekly - 2 sets - 10 reps - Seated Hamstring Stretch  - 1 x daily - 7 x weekly - 2 reps - 20 sec hold - Clamshell  - 1 x daily - 7 x weekly - 2 sets - 10 reps - Seated Figure 4 Piriformis Stretch  - 1 x daily - 7 x weekly - 3 sets - 10 reps - Supine Figure 4 Piriformis Stretch  - 3 x daily - 7 x weekly - 1 sets - 3 reps - 20 hold - Hooklying Single Knee to Chest Stretch  - 3 x daily - 7 x weekly - 1 sets - 3 reps - 20 hold - Supine Double Knee to Chest  - 3 x daily - 7 x weekly - 1 sets - 3 reps - 20 hold - Hip Hiking on Step  - 1 x daily - 3 x weekly - 2 sets - 10 reps - Standing Isometric Hip Abduction with Knee at 90 at Wall  - 1 x daily - 3 x weekly - 2 sets - 10 reps - 5 sec hold  ASSESSMENT:  CLINICAL IMPRESSION: Mr Blakeman presents to skilled PT reporting that currently, his back pain is 0% better  since initial evaluation.  Patient was progressing well and having less pain until his fall.  Since his fall, he has been limited by increased pain.  Patient recommended to follow back up  with Dr Ellery Guthrie given the increased pain he has had since his fall.  Patient discharged at this time to continue HEP and follow up with MD secondary to increased pain since fall.  OBJECTIVE IMPAIRMENTS: decreased balance, difficulty walking, decreased strength, increased muscle spasms, impaired flexibility, postural dysfunction, and pain.     GOALS: Goals reviewed with patient? Yes  SHORT TERM GOALS: Target date: 01/18/24 Patient will be independent with initial HEP. Baseline: Goal status: Met on 01/05/24  2.  Patient will report at least a 25% improvement in Rt LE and low back pain with standing  Baseline:  Goal status: Met on 01/05/24  3.  Stand for > or = to 5-7 minutes for self-care tasks without limitation  Baseline: 3-5 minutes  Goal status: Met on 01/26/24   LONG TERM GOALS: Target date: 02/14/2024   Patient will be independent with advanced HEP to allow for self progression after discharge. Baseline:  Goal status: Met  2.  Modified Oswestry to < or = to 10/50 (20%) for improved function Baseline: 19/50 (38%) Goal status: Ongoing (see above)  3.  Improve PSFS to > or = to 7/10 for unloading the dishwasher due to reduced pain Baseline: 5/10 Goal status: Met on 01/26/24  4.  Improve PSFS to > or = to 5-6/10 for taking a shower due to reduced pain Baseline: 3-4/10 Goal status: Met on 01/26/24  5. Report > or = to 40% reduction in low back and Rt LE symptoms with standing and walking   Baseline: 7-8/10  Goal Status: Ongoing   PLAN:  PT FREQUENCY: 2x/week  PT DURATION: 8 weeks  PLANNED INTERVENTIONS: 97164- PT Re-evaluation, 97110-Therapeutic exercises, 97530- Therapeutic activity, [id number]- Neuromuscular re-education, 97535- Self Care, [id number]- Manual therapy, [id number]- Gait training, [phone]-  Canalith repositioning, [id number]- Aquatic Therapy, 97014- Electrical stimulation (unattended), [id number]- Electrical stimulation (manual), [id number]- Vasopneumatic device, [id number]- Ultrasound, [id number]- Traction (mechanical), Patient/Family education, Balance training, Stair training, Taping, Dry Needling, Joint mobilization, Joint manipulation, Spinal manipulation, Spinal mobilization, Cryotherapy, Moist heat, and Biofeedback   PHYSICAL THERAPY DISCHARGE SUMMARY  See above for progress through plan of care.  Patient agrees to discharge. Patient goals were partially met. Patient is being discharged due to  maximized rehab potential at this time; has had increased pain since recent fall.  Recommend patient follow up with MD given increased pain since fall.     Robyne Christen, PT, DPT 02/15/24, 12:28 PM  [facility] [address] [place], [place] [address] Phone # [phone] Fax [phone]

## 2024-02-16 ENCOUNTER — Encounter: Admitting: Rehabilitative and Restorative Service Providers"

## 2024-02-17 ENCOUNTER — Encounter: Payer: Self-pay | Admitting: Family Medicine

## 2024-02-17 ENCOUNTER — Other Ambulatory Visit: Payer: Self-pay | Admitting: Family Medicine

## 2024-02-17 DIAGNOSIS — M5431 Sciatica, right side: Secondary | ICD-10-CM

## 2024-02-17 DIAGNOSIS — M48061 Spinal stenosis, lumbar region without neurogenic claudication: Secondary | ICD-10-CM

## 2024-02-17 MED ORDER — HYDROCODONE-ACETAMINOPHEN 10-325 MG PO TABS: ORAL_TABLET | ORAL | 0 refills | Status: DC

## 2024-02-17 NOTE — Progress Notes (Signed)
 Patient has been using medication sparingly and pursuing further treatment from surgery clinic. Refill is reasonable at this point.

## 2024-02-23 ENCOUNTER — Other Ambulatory Visit: Payer: Self-pay | Admitting: Nurse Practitioner

## 2024-02-23 DIAGNOSIS — K7460 Unspecified cirrhosis of liver: Secondary | ICD-10-CM

## 2024-03-02 ENCOUNTER — Ambulatory Visit
Admission: RE | Admit: 2024-03-02 | Discharge: 2024-03-02 | Disposition: A | Discharge status: Home / Self Care | Source: Ambulatory Visit | Attending: Nurse Practitioner | Admitting: Nurse Practitioner

## 2024-03-02 DIAGNOSIS — K7460 Unspecified cirrhosis of liver: Secondary | ICD-10-CM

## 2024-03-05 ENCOUNTER — Encounter: Payer: Self-pay | Admitting: Family Medicine

## 2024-03-06 ENCOUNTER — Other Ambulatory Visit: Payer: Self-pay | Admitting: Neurology

## 2024-03-06 ENCOUNTER — Encounter: Payer: Self-pay | Admitting: Family Medicine

## 2024-03-07 ENCOUNTER — Other Ambulatory Visit: Payer: Self-pay | Admitting: Family Medicine

## 2024-03-07 DIAGNOSIS — M48061 Spinal stenosis, lumbar region without neurogenic claudication: Secondary | ICD-10-CM

## 2024-03-07 MED ORDER — TRAMADOL HCL 50 MG PO TABS: 50.0000 mg | ORAL_TABLET | Freq: Three times a day (TID) | ORAL | 0 refills | Status: DC | PRN

## 2024-03-07 NOTE — Progress Notes (Unsigned)
 The patient requests refill on tramadol .  He has been taking this consistently without side effects and tolerates well.  He has been compliant with all other therapies and I believe this is currently appropriate.

## 2024-03-16 ENCOUNTER — Other Ambulatory Visit (HOSPITAL_COMMUNITY): Payer: Self-pay | Admitting: Urology

## 2024-03-16 DIAGNOSIS — R310 Gross hematuria: Secondary | ICD-10-CM

## 2024-03-28 ENCOUNTER — Ambulatory Visit (INDEPENDENT_AMBULATORY_CARE_PROVIDER_SITE_OTHER): Admitting: Family Medicine

## 2024-03-28 ENCOUNTER — Ambulatory Visit (HOSPITAL_COMMUNITY)
Admission: RE | Admit: 2024-03-28 | Discharge: 2024-03-28 | Disposition: A | Discharge status: Home / Self Care | Source: Ambulatory Visit | Attending: Urology | Admitting: Urology

## 2024-03-28 ENCOUNTER — Encounter: Payer: Self-pay | Admitting: Family Medicine

## 2024-03-28 VITALS — BP 129/97 | Ht 76.0 in | Wt 212.0 lb

## 2024-03-28 DIAGNOSIS — M5412 Radiculopathy, cervical region: Secondary | ICD-10-CM

## 2024-03-28 DIAGNOSIS — M48061 Spinal stenosis, lumbar region without neurogenic claudication: Secondary | ICD-10-CM

## 2024-03-28 DIAGNOSIS — R310 Gross hematuria: Secondary | ICD-10-CM | POA: Diagnosis present

## 2024-03-28 MED ORDER — IOHEXOL 300 MG/ML  SOLN
100.0000 mL | Freq: Once | INTRAMUSCULAR | Status: AC | PRN
  Administered 2024-03-28: 100 mL via INTRAVENOUS

## 2024-03-28 MED ORDER — NORTRIPTYLINE HCL 10 MG PO CAPS: ORAL_CAPSULE | ORAL | 0 refills | Status: DC

## 2024-03-28 NOTE — Assessment & Plan Note (Signed)
-   I discussed the treatment options with the patient.  - At this time he has failed conservative management and his pain is interfering with sleep and all of his daily activities.  - I do think he would be a candidate for decompression surgery. He will meet with his spinal surgeon about this when he is ready.  - We will see him back as needed

## 2024-03-28 NOTE — Assessment & Plan Note (Signed)
-   The patient was previously prescribed Nortriptyline  by his neurologist who has since retired.  - We will refill this currently until he establishes with a new PCP in the next week or two

## 2024-03-28 NOTE — Progress Notes (Addendum)
 Isaiah Price - 73 y.o. male MRN [id number]  Date of birth: 14-Dec-1950  PCP: Tura Gaines, MD  Subjective:  CC:  Spinal stenosis  HPI: Past Medical, Surgical, Social, and Family History Reviewed & Updated per EMR.   Patient is a 73 y.o. male here for follow up on his pain from spinal stenosis. He got benefit from the previous injections until he had a fall and has had recurrent pain since which interferes with sleep and all movement. He denies any new numbness, weakness, LOBB, fevers or chills.   Past Medical History:  Diagnosis Date   Achilles tendinitis, left leg    At risk for sleep apnea    STOP--BANG SCORE= 5  (routed to pt's pcp in epic 11-13-2018)   Cervicogenic headache 06/08/2021   Chronic insomnia 12/31/2015   Chronic neck pain    Congenital pectus excavatum    per pt had pulmonary test ,  60% restrictive by states asymptomatic   DDD (degenerative disc disease), cervical    Epicondylitis, lateral, right    Gait abnormality 06/08/2021   GERD (gastroesophageal reflux disease)    History of exercise intolerance    09-02-2017  ETT,  negative for ischemia   History of pericarditis 1985   HTN (hypertension)    Hypercholesteremia 12/31/2015   Iritis    IT band syndrome 05/06/2014   Lower urinary tract symptoms (LUTS)    Neuropathy    bilateral arms with tingling and numbness at times due to cervical neck   OA (osteoarthritis)    Polyarthralgia    Prostate cancer [facility])    urologist-  dr Rozanne Corners--- Stage T1c, Gleason 3+4, 06/08/21 recurrent prostate cancer   Rheumatoid arthritis [facility])    rheumatologist-- dr Ebbie Goldmann--- seropositive,multiple sites   Tendon disorder 2022   torn tendon, right arm   Wears glasses     Current Outpatient Medications on File Prior to Visit  Medication Sig Dispense Refill   allopurinol (ZYLOPRIM) 100 MG tablet Take 200 mg by mouth daily.     diltiazem  (CARDIZEM ) 90 MG tablet Take 90 mg by mouth at bedtime.      fluticasone  (FLONASE ) 50 MCG/ACT  nasal spray Place 1 spray into the nose daily as needed for allergies.      HYDROcodone -acetaminophen  (NORCO) 10-325 MG tablet TAKE 1 TABLET EVERY 6 HOURS AS NEEDED FOR MODERATE TO SEVERE PAIN. Use sparingly 20 tablet 0   omeprazole (PRILOSEC) 40 MG capsule Take 40 mg by mouth daily.      Polyvinyl Alcohol-Povidone (REFRESH OP) Place 1 drop into both eyes 2 (two) times daily as needed (dry eyes).      predniSONE  (DELTASONE ) 10 MG tablet 6 tabs by mouth day 1, 5 tabs by mouth day 2, 4 tabs by mouth day 3, 3 tabs by mouth day 4, 2 tabs by mouth day 5, 1 tab by mouth day 6 21 tablet 0   pregabalin  (LYRICA ) 25 MG capsule Take 2 capsules (50 mg total) by mouth 2 (two) times daily. 120 capsule 1   rosuvastatin  (CRESTOR ) 40 MG tablet TAKE 1 TABLET BY MOUTH DAILY 90 tablet 3   traMADol  (ULTRAM ) 50 MG tablet Take 1 tablet (50 mg total) by mouth every 8 (eight) hours as needed (pain). TAKE (1) TABLET EVERY EIGHT HOURS AS NEEDED FOR PAIN. 60 tablet 0   tretinoin (RETIN-A) 0.05 % cream Apply a thin layer to face nightly     zolpidem  (AMBIEN ) 10 MG tablet Take 10 mg by mouth at  bedtime as needed for sleep.     Current Facility-Administered Medications on File Prior to Visit  Medication Dose Route Frequency Provider Last Rate Last Admin   hydrocortisone  sodium succinate (SOLU-CORTEF ) injection 100 mg  100 mg Intravenous Once Florencio Hunting, MD        Past Surgical History:  Procedure Laterality Date   ANTERIOR CERVICAL DECOMP/DISCECTOMY FUSION  32   dr Cipriano Creeks   C5 -- 7  (per pt fusion failed)   CARPAL TUNNEL RELEASE Right done at same time of CMC fusion   CARPOMETACARPAL (CMC) FUSION OF THUMB Bilateral 2015 and 2014  approx.   CATARACT EXTRACTION W/ INTRAOCULAR LENS  IMPLANT, BILATERAL  2013   LYMPHADENECTOMY Bilateral 11/16/2018   Procedure: LYMPHADENECTOMY, PELVIC;  Surgeon: Florencio Hunting, MD;  Location: [facility];  Service: Urology;  Laterality: Bilateral;   ROBOT ASSISTED LAPAROSCOPIC RADICAL  PROSTATECTOMY N/A 11/16/2018   Procedure: XI ROBOTIC ASSISTED LAPAROSCOPIC RADICAL PROSTATECTOMY LEVEL 2;  Surgeon: Florencio Hunting, MD;  Location: [facility];  Service: Urology;  Laterality: N/A;   SHOULDER SURGERY Left age 69   TONSILLECTOMY  child    Allergies  Allergen Reactions   Nsaids Other (See Comments)    Causes GI bleeds (numerous times)   Brimonidine Itching and Other (See Comments)    redness Abdominal Pain   Gabapentin  Other (See Comments)    Couldn't talk   Meloxicam Other (See Comments)   Promethazine  Hcl     Vision changes   Trazodone     Other reaction(s): Other (See Comments) Bladder incontinence        Objective:  Physical Exam: VS: BP:(!) 129/97  HR: bpm  TEMP: ( )  RESP:   HT:6' 4 (193 cm)   WT:212 lb (96.2 kg)  BMI:25.82  Gen: NAD, speaks clearly, comfortable in exam room Respiratory: Normal respiratory effort on room air. No signs of distress Skin: No rashes, abrasions, or ecchymosis MSK:  The neck has full ROM w/ mild ttp and spasticity over the paraspinal muscles TTP over the lumbar paraspinal muscles Decreased bulk of the gluteus muscles Slow gait with decreased stride length bilat    Assessment & Plan:   Spinal stenosis of lumbar region - I discussed the treatment options with the patient.  - At this time he has failed conservative management and his pain is interfering with sleep and all of his daily activities.  - I do think he would be a candidate for decompression surgery. He will meet with his spinal surgeon about this when he is ready.  - We will see him back as needed  Cervical radiculopathy  - The patient was previously prescribed Nortriptyline  by his neurologist who has since retired.  - We will refill this currently until he establishes with a new PCP in the next week or two     Berneda Bridges MD [facility] Sports Medicine Fellow

## 2024-04-11 ENCOUNTER — Other Ambulatory Visit: Payer: Self-pay | Admitting: Neurological Surgery

## 2024-04-18 ENCOUNTER — Encounter: Payer: Self-pay | Admitting: Sports Medicine

## 2024-04-19 ENCOUNTER — Other Ambulatory Visit: Payer: Self-pay | Admitting: Sports Medicine

## 2024-04-19 DIAGNOSIS — M48061 Spinal stenosis, lumbar region without neurogenic claudication: Secondary | ICD-10-CM

## 2024-04-19 MED ORDER — HYDROCODONE-ACETAMINOPHEN 10-325 MG PO TABS: ORAL_TABLET | ORAL | 0 refills | Status: DC

## 2024-05-01 ENCOUNTER — Other Ambulatory Visit: Payer: Self-pay | Admitting: *Deleted

## 2024-05-01 MED ORDER — NORTRIPTYLINE HCL 10 MG PO CAPS: ORAL_CAPSULE | ORAL | 0 refills | Status: DC

## 2024-05-10 NOTE — Progress Notes (Signed)
 Surgical Instructions   Your procedure is scheduled on Thursday July 31. Report to [facility] Main Entrance A at 10:20 A.M., then check in with the Admitting office. Any questions or running late day of surgery: call [phone]  Questions prior to your surgery date: call [phone], Monday-Friday, 8am-4pm. If you experience any cold or flu symptoms such as cough, fever, chills, shortness of breath, etc. between now and your scheduled surgery, please notify us  at the above number.     Remember:  Do not or drink anything eat after midnight the night before your surgery     Take these medicines the morning of surgery with A SIP OF WATER  allopurinol (ZYLOPRIM  omeprazole (PRILOSEC)  rosuvastatin  (CRESTOR )   May take these medicines IF NEEDED: acetaminophen  (TYLENOL )  fluticasone  (FLONASE )  HYDROcodone -acetaminophen  (NORCO)  Polyvinyl Alcohol-Povidone (REFRESH OP)  traMADol  (ULTRAM )   One week prior to surgery, STOP taking any Aspirin (unless otherwise instructed by your surgeon) Aleve, Naproxen, Ibuprofen, Motrin, Advil, Goody's, BC's, all herbal medications, fish oil, and non-prescription vitamins.                     Do NOT Smoke (Tobacco/Vaping) for 24 hours prior to your procedure.  If you use a CPAP at night, you may bring your mask/headgear for your overnight stay.   You will be asked to remove any contacts, glasses, piercing's, hearing aid's, dentures/partials prior to surgery. Please bring cases for these items if needed.    Patients discharged the day of surgery will not be allowed to drive home, and someone needs to stay with them for 24 hours.  SURGICAL WAITING ROOM VISITATION Patients may have no more than 2 support people in the waiting area - these visitors may rotate.   Pre-op nurse will coordinate an appropriate time for 1 ADULT support person, who may not rotate, to accompany patient in pre-op.  Children under the age of 73 must have an adult with them  who is not the patient and must remain in the main waiting area with an adult.  If the patient needs to stay at the hospital during part of their recovery, the visitor guidelines for inpatient rooms apply.  Please refer to the [facility] website for the visitor guidelines for any additional information.   If you received a COVID test during your pre-op visit  it is requested that you wear a mask when out in public, stay away from anyone that may not be feeling well and notify your surgeon if you develop symptoms. If you have been in contact with anyone that has tested positive in the last 10 days please notify you surgeon.      Pre-operative 5 CHG Bathing Instructions   You can play a key role in reducing the risk of infection after surgery. Your skin needs to be as free of germs as possible. You can reduce the number of germs on your skin by washing with CHG (chlorhexidine  gluconate) soap before surgery. CHG is an antiseptic soap that kills germs and continues to kill germs even after washing.   DO NOT use if you have an allergy to chlorhexidine /CHG or antibacterial soaps. If your skin becomes reddened or irritated, stop using the CHG and notify one of our RNs at [phone].   Please shower with the CHG soap starting 4 days before surgery using the following schedule:     Please keep in mind the following:  DO NOT shave, including legs and underarms, starting  the day of your first shower.   You may shave your face at any point before/day of surgery.  Place clean sheets on your bed the day you start using CHG soap. Use a clean washcloth (not used since being washed) for each shower. DO NOT sleep with pets once you start using the CHG.   CHG Shower Instructions:  Wash your face and private area with normal soap. If you choose to wash your hair, wash first with your normal shampoo.  After you use shampoo/soap, rinse your hair and body thoroughly to remove shampoo/soap residue.  Turn  the water OFF and apply about 3 tablespoons (45 ml) of CHG soap to a CLEAN washcloth.  Apply CHG soap ONLY FROM YOUR NECK DOWN TO YOUR TOES (washing for 3-5 minutes)  DO NOT use CHG soap on face, private areas, open wounds, or sores.  Pay special attention to the area where your surgery is being performed.  If you are having back surgery, having someone wash your back for you may be helpful. Wait 2 minutes after CHG soap is applied, then you may rinse off the CHG soap.  Pat dry with a clean towel  Put on clean clothes/pajamas   If you choose to wear lotion, please use ONLY the CHG-compatible lotions that are listed below.  Additional instructions for the day of surgery: DO NOT APPLY any lotions, deodorants, cologne, or perfumes.   Do not bring valuables to the hospital. [facility] is not responsible for any belongings/valuables. Do not wear nail polish, gel polish, artificial nails, or any other type of covering on natural nails (fingers and toes) Do not wear jewelry or makeup Put on clean/comfortable clothes.  Please brush your teeth.  Ask your nurse before applying any prescription medications to the skin.     CHG Compatible Lotions   Aveeno Moisturizing lotion  Cetaphil Moisturizing Cream  Cetaphil Moisturizing Lotion  Clairol Herbal Essence Moisturizing Lotion, Dry Skin  Clairol Herbal Essence Moisturizing Lotion, Extra Dry Skin  Clairol Herbal Essence Moisturizing Lotion, Normal Skin  Curel Age Defying Therapeutic Moisturizing Lotion with Alpha Hydroxy  Curel Extreme Care Body Lotion  Curel Soothing Hands Moisturizing Hand Lotion  Curel Therapeutic Moisturizing Cream, Fragrance-Free  Curel Therapeutic Moisturizing Lotion, Fragrance-Free  Curel Therapeutic Moisturizing Lotion, Original Formula  Eucerin Daily Replenishing Lotion  Eucerin Dry Skin Therapy Plus Alpha Hydroxy Crme  Eucerin Dry Skin Therapy Plus Alpha Hydroxy Lotion  Eucerin Original Crme  Eucerin Original  Lotion  Eucerin Plus Crme Eucerin Plus Lotion  Eucerin TriLipid Replenishing Lotion  Keri Anti-Bacterial Hand Lotion  Keri Deep Conditioning Original Lotion Dry Skin Formula Softly Scented  Keri Deep Conditioning Original Lotion, Fragrance Free Sensitive Skin Formula  Keri Lotion Fast Absorbing Fragrance Free Sensitive Skin Formula  Keri Lotion Fast Absorbing Softly Scented Dry Skin Formula  Keri Original Lotion  Keri Skin Renewal Lotion Keri Silky Smooth Lotion  Keri Silky Smooth Sensitive Skin Lotion  Nivea Body Creamy Conditioning Oil  Nivea Body Extra Enriched Lotion  Nivea Body Original Lotion  Nivea Body Sheer Moisturizing Lotion Nivea Crme  Nivea Skin Firming Lotion  NutraDerm 30 Skin Lotion  NutraDerm Skin Lotion  NutraDerm Therapeutic Skin Cream  NutraDerm Therapeutic Skin Lotion  ProShield Protective Hand Cream  Provon moisturizing lotion  Please read over the following fact sheets that you were given.

## 2024-05-11 ENCOUNTER — Inpatient Hospital Stay (HOSPITAL_COMMUNITY)
Admission: RE | Admit: 2024-05-11 | Discharge: 2024-05-11 | Disposition: A | Discharge status: Home / Self Care | Source: Ambulatory Visit

## 2024-05-17 ENCOUNTER — Encounter (HOSPITAL_COMMUNITY): Admission: RE | Payer: Self-pay | Source: Home / Self Care

## 2024-05-17 ENCOUNTER — Encounter: Payer: Self-pay | Admitting: Sports Medicine

## 2024-05-17 ENCOUNTER — Ambulatory Visit (HOSPITAL_COMMUNITY): Admission: RE | Admit: 2024-05-17 | Source: Home / Self Care | Admitting: Neurological Surgery

## 2024-05-17 DIAGNOSIS — M48061 Spinal stenosis, lumbar region without neurogenic claudication: Secondary | ICD-10-CM

## 2024-05-17 SURGERY — POSTERIOR LUMBAR FUSION 1 LEVEL
Anesthesia: General | Site: Back

## 2024-05-19 MED ORDER — HYDROCODONE-ACETAMINOPHEN 10-325 MG PO TABS: ORAL_TABLET | ORAL | 0 refills | Status: DC

## 2024-05-28 ENCOUNTER — Other Ambulatory Visit: Payer: Self-pay | Admitting: Family Medicine

## 2024-05-28 MED ORDER — NORTRIPTYLINE HCL 10 MG PO CAPS: ORAL_CAPSULE | ORAL | 1 refills | Status: AC

## 2024-06-07 ENCOUNTER — Ambulatory Visit: Admitting: Family Medicine

## 2024-06-07 ENCOUNTER — Encounter: Payer: Self-pay | Admitting: Family Medicine

## 2024-06-07 VITALS — BP 120/70 | Ht 76.0 in | Wt 212.0 lb

## 2024-06-07 DIAGNOSIS — M48061 Spinal stenosis, lumbar region without neurogenic claudication: Secondary | ICD-10-CM

## 2024-06-07 DIAGNOSIS — B029 Zoster without complications: Secondary | ICD-10-CM | POA: Diagnosis not present

## 2024-06-07 MED ORDER — VALACYCLOVIR HCL 1 G PO TABS: 1000.0000 mg | ORAL_TABLET | Freq: Three times a day (TID) | ORAL | 0 refills | Status: AC

## 2024-06-07 MED ORDER — HYDROCODONE-ACETAMINOPHEN 10-325 MG PO TABS: ORAL_TABLET | ORAL | 0 refills | Status: DC

## 2024-06-07 MED ORDER — TIZANIDINE HCL 4 MG PO TABS: 4.0000 mg | ORAL_TABLET | Freq: Three times a day (TID) | ORAL | 1 refills | Status: AC | PRN

## 2024-06-07 NOTE — Assessment & Plan Note (Signed)
-   Refilled Vicodin 10-325 mg -Prescribed tizanidine  4 mg every 8h as needed, advised starting this prescription with a dose at bedtime and increasing as needed not to exceed 3 times a day -Advised patient to follow-up with his spinal surgeon to discuss postop requirements

## 2024-06-07 NOTE — Progress Notes (Addendum)
    SUBJECTIVE:   CHIEF COMPLAINT / HPI:   Patient presenting for follow up of spinal stenosis Scheduled with surgery for fusion of the lumbar spine on 06/28/24  Patient reports continued burning, shooting pain, primarily on the left with standing, lying down, and when going from standing to sitting and vice versa to a lesser extent. 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  His wife was recently hospitalized in the ICU for perforated bowel and now has a colostomy.  He is concerned about his ability to comply with recovery instructions for his spinal fusion surgery due to his wife's care needs at this time.  He is requesting refills of medications.  Incidentally he also reports new onset of linear, painful itchy rash across the chest and into his underarm.  It appeared yesterday and has blistered since then.  He does have a history of having had chickenpox as a child.  He is concerned it is shingles.  PERTINENT  PMH / PSH: spinal stenosis of the lumbar region, cervical radiculopathy  OBJECTIVE:   BP 120/70 (BP Location: Left Arm, Patient Position: Sitting)   Ht 6' 4 (1.93 m)   Wt 212 lb (96.2 kg)   BMI 25.81 kg/m   General: Well-appearing elderly gentleman, no distress Skin: Red raised vesicular rash along the left chest wall along T5/T6 dermatome.  Some vesicles closer to the midline have crusted over and begin to heal, vesicles located closer to the axilla have yet to burst.  Some new forming vesicles present on the back. MSK: Antalgic gait, some hesitancy when going from sitting to standing standing to sitting.  Tender over the right SI joint and into the lumbar spine.  ASSESSMENT/PLAN:   Assessment & Plan Spinal stenosis of lumbar region, unspecified whether neurogenic claudication present - Refilled Vicodin 10-325 mg -Prescribed tizanidine  4 mg every 8h as needed, advised starting this prescription  with a dose at bedtime and increasing as needed not to exceed 3 times a day -Advised patient to follow-up with his spinal surgeon to discuss postop requirements Herpes zoster without complication - Onset within 48 hours, characteristic rash present -Prescribed valacyclovir  1000 mg 3 times daily -Advised patient to avoid direct lesion contact with susceptible individuals until lesions have completely crusted over   Lucie Pinal, DO [facility]

## 2024-06-28 ENCOUNTER — Ambulatory Visit (HOSPITAL_COMMUNITY): Admit: 2024-06-28 | Admitting: Neurological Surgery

## 2024-06-28 ENCOUNTER — Encounter: Payer: Self-pay | Admitting: Family Medicine

## 2024-06-28 DIAGNOSIS — M48061 Spinal stenosis, lumbar region without neurogenic claudication: Secondary | ICD-10-CM

## 2024-06-28 SURGERY — POSTERIOR LUMBAR FUSION 1 LEVEL
Anesthesia: General | Site: Back

## 2024-07-02 MED ORDER — HYDROCODONE-ACETAMINOPHEN 10-325 MG PO TABS
ORAL_TABLET | ORAL | 0 refills | Status: AC
Start: 2024-07-02 — End: ?

## 2024-07-04 ENCOUNTER — Encounter: Payer: Self-pay | Admitting: Family Medicine

## 2024-07-13 NOTE — Progress Notes (Addendum)
 Surgical Instructions   Your procedure is scheduled on Thursday, October 2nd, 2025. Report to [facility] Main Entrance A at 8:00 A.M., then check in with the Admitting office. Any questions or running late day of surgery: call [phone]  Questions prior to your surgery date: call [phone], Monday-Friday, 8am-4pm. If you experience any cold or flu symptoms such as cough, fever, chills, shortness of breath, etc. between now and your scheduled surgery, please notify us  at the above number.     Remember:  Do not eat after midnight the night before your surgery  You may drink clear liquids until 7:00 the morning of your surgery.   Clear liquids allowed are: Water, Non-Citrus Juices (without pulp), Carbonated Beverages, Clear Tea (no milk, honey, etc.), Black Coffee Only (NO MILK, CREAM OR POWDERED CREAMER of any kind), and Gatorade.    Take these medicines the morning of surgery with A SIP OF WATER: Allopurinol (Zyloprim) Omeprazole (Prilosec)   May take these medicines IF NEEDED: Acetaminophen  (Tylenol ) Fluticasone  (Flonase ) Hydrocodone -acetaminophen  (Norco) Refresh Eye Drop Tizanidine  (Zanaflex )    One week prior to surgery, STOP taking any Aspirin (unless otherwise instructed by your surgeon) Aleve, Naproxen, Ibuprofen, Motrin, Advil, Goody's, BC's, all herbal medications, fish oil, and non-prescription vitamins.                     Do NOT Smoke (Tobacco/Vaping) for 24 hours prior to your procedure.  If you use a CPAP at night, you may bring your mask/headgear for your overnight stay.   You will be asked to remove any contacts, glasses, piercing's, hearing aid's, dentures/partials prior to surgery. Please bring cases for these items if needed.    Patients discharged the day of surgery will not be allowed to drive home, and someone needs to stay with them for 24 hours.  SURGICAL WAITING ROOM VISITATION Patients may have no more than 2 support people in the waiting  area - these visitors may rotate.   Pre-op nurse will coordinate an appropriate time for 1 ADULT support person, who may not rotate, to accompany patient in pre-op.  Children under the age of 45 must have an adult with them who is not the patient and must remain in the main waiting area with an adult.  If the patient needs to stay at the hospital during part of their recovery, the visitor guidelines for inpatient rooms apply.  Please refer to the [facility] website for the visitor guidelines for any additional information.   If you received a COVID test during your pre-op visit  it is requested that you wear a mask when out in public, stay away from anyone that may not be feeling well and notify your surgeon if you develop symptoms. If you have been in contact with anyone that has tested positive in the last 10 days please notify you surgeon.      Pre-operative 5 CHG Bathing Instructions   You can play a key role in reducing the risk of infection after surgery. Your skin needs to be as free of germs as possible. You can reduce the number of germs on your skin by washing with CHG (chlorhexidine  gluconate) soap before surgery. CHG is an antiseptic soap that kills germs and continues to kill germs even after washing.   DO NOT use if you have an allergy to chlorhexidine /CHG or antibacterial soaps. If your skin becomes reddened or irritated, stop using the CHG and notify one of our RNs at [phone].  Please shower with the CHG soap starting 4 days before surgery using the following schedule:     Please keep in mind the following:  DO NOT shave, including legs and underarms, starting the day of your first shower.   You may shave your face at any point before/day of surgery.  Place clean sheets on your bed the day you start using CHG soap. Use a clean washcloth (not used since being washed) for each shower. DO NOT sleep with pets once you start using the CHG.   CHG Shower Instructions:   Wash your face and private area with normal soap. If you choose to wash your hair, wash first with your normal shampoo.  After you use shampoo/soap, rinse your hair and body thoroughly to remove shampoo/soap residue.  Turn the water OFF and apply about 3 tablespoons (45 ml) of CHG soap to a CLEAN washcloth.  Apply CHG soap ONLY FROM YOUR NECK DOWN TO YOUR TOES (washing for 3-5 minutes)  DO NOT use CHG soap on face, private areas, open wounds, or sores.  Pay special attention to the area where your surgery is being performed.  If you are having back surgery, having someone wash your back for you may be helpful. Wait 2 minutes after CHG soap is applied, then you may rinse off the CHG soap.  Pat dry with a clean towel  Put on clean clothes/pajamas   If you choose to wear lotion, please use ONLY the CHG-compatible lotions that are listed below.  Additional instructions for the day of surgery: DO NOT APPLY any lotions, deodorants, cologne, or perfumes.   Do not bring valuables to the hospital. [facility] is not responsible for any belongings/valuables. Do not wear nail polish, gel polish, artificial nails, or any other type of covering on natural nails (fingers and toes) Do not wear jewelry or makeup Put on clean/comfortable clothes.  Please brush your teeth.  Ask your nurse before applying any prescription medications to the skin.     CHG Compatible Lotions   Aveeno Moisturizing lotion  Cetaphil Moisturizing Cream  Cetaphil Moisturizing Lotion  Clairol Herbal Essence Moisturizing Lotion, Dry Skin  Clairol Herbal Essence Moisturizing Lotion, Extra Dry Skin  Clairol Herbal Essence Moisturizing Lotion, Normal Skin  Curel Age Defying Therapeutic Moisturizing Lotion with Alpha Hydroxy  Curel Extreme Care Body Lotion  Curel Soothing Hands Moisturizing Hand Lotion  Curel Therapeutic Moisturizing Cream, Fragrance-Free  Curel Therapeutic Moisturizing Lotion, Fragrance-Free  Curel  Therapeutic Moisturizing Lotion, Original Formula  Eucerin Daily Replenishing Lotion  Eucerin Dry Skin Therapy Plus Alpha Hydroxy Crme  Eucerin Dry Skin Therapy Plus Alpha Hydroxy Lotion  Eucerin Original Crme  Eucerin Original Lotion  Eucerin Plus Crme Eucerin Plus Lotion  Eucerin TriLipid Replenishing Lotion  Keri Anti-Bacterial Hand Lotion  Keri Deep Conditioning Original Lotion Dry Skin Formula Softly Scented  Keri Deep Conditioning Original Lotion, Fragrance Free Sensitive Skin Formula  Keri Lotion Fast Absorbing Fragrance Free Sensitive Skin Formula  Keri Lotion Fast Absorbing Softly Scented Dry Skin Formula  Keri Original Lotion  Keri Skin Renewal Lotion Keri Silky Smooth Lotion  Keri Silky Smooth Sensitive Skin Lotion  Nivea Body Creamy Conditioning Oil  Nivea Body Extra Enriched Teacher, adult education Moisturizing Lotion Nivea Crme  Nivea Skin Firming Lotion  NutraDerm 30 Skin Lotion  NutraDerm Skin Lotion  NutraDerm Therapeutic Skin Cream  NutraDerm Therapeutic Skin Lotion  ProShield Protective Hand Cream  Provon moisturizing lotion  Please read  over the following fact sheets that you were given.

## 2024-07-16 ENCOUNTER — Encounter (HOSPITAL_COMMUNITY)
Admission: RE | Admit: 2024-07-16 | Discharge: 2024-07-16 | Disposition: A | Discharge status: Home / Self Care | Source: Ambulatory Visit | Attending: Neurological Surgery | Admitting: Neurological Surgery

## 2024-07-16 ENCOUNTER — Encounter (HOSPITAL_COMMUNITY): Payer: Self-pay

## 2024-07-16 ENCOUNTER — Other Ambulatory Visit: Payer: Self-pay

## 2024-07-16 VITALS — BP 123/81 | HR 102 | Temp 98.3°F | Resp 18 | Ht 76.0 in | Wt 210.3 lb

## 2024-07-16 DIAGNOSIS — K219 Gastro-esophageal reflux disease without esophagitis: Secondary | ICD-10-CM | POA: Diagnosis not present

## 2024-07-16 DIAGNOSIS — Z01812 Encounter for preprocedural laboratory examination: Secondary | ICD-10-CM | POA: Diagnosis present

## 2024-07-16 DIAGNOSIS — I10 Essential (primary) hypertension: Secondary | ICD-10-CM | POA: Insufficient documentation

## 2024-07-16 DIAGNOSIS — I714 Abdominal aortic aneurysm, without rupture, unspecified: Secondary | ICD-10-CM | POA: Insufficient documentation

## 2024-07-16 DIAGNOSIS — M069 Rheumatoid arthritis, unspecified: Secondary | ICD-10-CM | POA: Insufficient documentation

## 2024-07-16 DIAGNOSIS — K760 Fatty (change of) liver, not elsewhere classified: Secondary | ICD-10-CM | POA: Insufficient documentation

## 2024-07-16 DIAGNOSIS — Z01818 Encounter for other preprocedural examination: Secondary | ICD-10-CM

## 2024-07-16 DIAGNOSIS — Z79899 Other long term (current) drug therapy: Secondary | ICD-10-CM | POA: Diagnosis not present

## 2024-07-16 HISTORY — DX: Pneumonia, unspecified organism: J18.9

## 2024-07-16 HISTORY — DX: Dyspnea, unspecified: R06.00

## 2024-07-16 LAB — COMPREHENSIVE METABOLIC PANEL WITH GFR
ALT: 22 U/L (ref 0–44)
AST: 20 U/L (ref 15–41)
Albumin: 3.6 g/dL (ref 3.5–5.0)
Alkaline Phosphatase: 71 U/L (ref 38–126)
Anion gap: 10 (ref 5–15)
BUN: 20 mg/dL (ref 8–23)
CO2: 21 mmol/L — ABNORMAL LOW (ref 22–32)
Calcium: 9.3 mg/dL (ref 8.9–10.3)
Chloride: 108 mmol/L (ref 98–111)
Creatinine, Ser: 1.13 mg/dL (ref 0.61–1.24)
GFR, Estimated: >60 mL/min (ref >60)
Glucose, Bld: 109 mg/dL — ABNORMAL HIGH (ref 70–99)
Potassium: 4.4 mmol/L (ref 3.5–5.1)
Sodium: 139 mmol/L (ref 135–145)
Total Bilirubin: 0.7 mg/dL (ref 0.0–1.2)
Total Protein: 6.5 g/dL (ref 6.5–8.1)

## 2024-07-16 LAB — CBC
HCT: 43.7 % (ref 39.0–52.0)
Hemoglobin: 14.6 g/dL (ref 13.0–17.0)
MCH: 30.7 pg (ref 26.0–34.0)
MCHC: 33.4 g/dL (ref 30.0–36.0)
MCV: 91.8 fL (ref 80.0–100.0)
Platelets: 203 K/uL (ref 150–400)
RBC: 4.76 MIL/uL (ref 4.22–5.81)
RDW: 12.5 % (ref 11.5–15.5)
WBC: 6.4 K/uL (ref 4.0–10.5)
nRBC: 0 % (ref 0.0–0.2)

## 2024-07-16 LAB — TYPE AND SCREEN
ABO/RH(D): A POS
Antibody Screen: NEGATIVE

## 2024-07-16 LAB — SURGICAL PCR SCREEN
MRSA, PCR: NEGATIVE
Staphylococcus aureus: POSITIVE — AB

## 2024-07-16 NOTE — Progress Notes (Signed)
 PCP - Dr. Prentice Blush Cardiologist - Dr. Lavona - LOV - 10/04/23 and follow up in 2 years  PPM/ICD - denies   Chest x-ray - denies EKG - 10/04/23 Stress Test - 09/02/17 ECHO - denies Cardiac Cath - denies  Sleep Study - denies  No DM  Last dose of GLP1 agonist-  n/a GLP1 instructions:  n/a  Blood Thinner Instructions:  n/a Aspirin Instructions: n/a  ERAS Protcol - clears until 0700 PRE-SURGERY Ensure or G2- n/a  COVID TEST- no   Anesthesia review:  yes - AAA  Patient denies shortness of breath, fever, cough and chest pain at PAT appointment   All instructions explained to the patient, with a verbal understanding of the material. Patient agrees to go over the instructions while at home for a better understanding. Patient also instructed to self quarantine after being tested for COVID-19. The opportunity to ask questions was provided.

## 2024-07-18 NOTE — Anesthesia Preprocedure Evaluation (Signed)
 Anesthesia Evaluation  Patient identified by MRN, date of birth, ID band Patient awake    Reviewed: Allergy & Precautions, NPO status , Patient's Chart, lab work & pertinent test results, reviewed documented beta blocker date and time   History of Anesthesia Complications (+) AWARENESS UNDER ANESTHESIA and history of anesthetic complications  Airway Mallampati: III  TM Distance: >3 FB   Mouth opening: Limited Mouth Opening  Dental no notable dental hx.    Pulmonary shortness of breath, neg sleep apnea, pneumonia, resolved, neg COPD, former smoker, neg PE   breath sounds clear to auscultation       Cardiovascular hypertension, + CAD  (-) Past MI, (-) Cardiac Stents and (-) CABG  Rhythm:Regular Rate:Normal     Neuro/Psych  Headaches, neg Seizures  Neuromuscular disease    GI/Hepatic ,GERD  Medicated and Controlled,,(+) neg Cirrhosis        Endo/Other    Renal/GU Renal disease     Musculoskeletal  (+) Arthritis , Rheumatoid disorders,    Abdominal   Peds  Hematology   Anesthesia Other Findings   Reproductive/Obstetrics                              Anesthesia Physical Anesthesia Plan  ASA: 2  Anesthesia Plan: General   Post-op Pain Management:    Induction: Intravenous  PONV Risk Score and Plan: 2 and Ondansetron  and Dexamethasone   Airway Management Planned: Video Laryngoscope Planned and Oral ETT  Additional Equipment:   Intra-op Plan:   Post-operative Plan: Extubation in OR  Informed Consent: I have reviewed the patients History and Physical, chart, labs and discussed the procedure including the risks, benefits and alternatives for the proposed anesthesia with the patient or authorized representative who has indicated his/her understanding and acceptance.     Dental advisory given  Plan Discussed with: CRNA  Anesthesia Plan Comments: (PAT note by Lynwood Hope,  PA-C: 73 year old male with pertinent history including rheumatoid arthritis on Orencia, GERD on PPI, fatty liver, congenital pectus excavatum, prostate cancer s/p radical prostatectomy and radiation therapy, HTN, AAA and TAA (4.1 cm ascending thoracic aortic aneurysm by CTA 09/22/2023; distal abdominal aortic aneurysm 4.1 x 3.4 cm by ultrasound 03/02/2024).  Exercise tolerance test 09/02/2017 negative for ischemia.  Preop labs reviewed, unremarkable.  EKG 10/04/2023: Normal sinus rhythm. Rate 88. Left anterior fascicular block. RSR' or QR pattern in V1 suggests right ventricular conduction delay  Exercise tolerance test 09/02/2017:  Blood pressure demonstrated a normal response to exercise.  There was no ST segment deviation noted during stress.   Normal ECG stress test.   )         Anesthesia Quick Evaluation

## 2024-07-18 NOTE — Progress Notes (Signed)
 Anesthesia Chart Review:  73 year old male with pertinent history including rheumatoid arthritis on Orencia, GERD on PPI, fatty liver, congenital pectus excavatum, prostate cancer s/p radical prostatectomy and radiation therapy, HTN, AAA and TAA (4.1 cm ascending thoracic aortic aneurysm by CTA 09/22/2023; distal abdominal aortic aneurysm 4.1 x 3.4 cm by ultrasound 03/02/2024).  Exercise tolerance test 09/02/2017 negative for ischemia.  Preop labs reviewed, unremarkable.  EKG 10/04/2023: Normal sinus rhythm. Rate 88. Left anterior fascicular block. RSR' or QR pattern in V1 suggests right ventricular conduction delay  Exercise tolerance test 09/02/2017: Blood pressure demonstrated a normal response to exercise. There was no ST segment deviation noted during stress.   Normal ECG stress test.    Lynwood Hope, PA-C [facility] Short Stay Center/Anesthesiology Phone [phone] 07/18/2024 1:39 PM

## 2024-07-19 ENCOUNTER — Other Ambulatory Visit: Payer: Self-pay

## 2024-07-19 ENCOUNTER — Ambulatory Visit (HOSPITAL_COMMUNITY)

## 2024-07-19 ENCOUNTER — Ambulatory Visit (HOSPITAL_COMMUNITY): Admitting: Anesthesiology

## 2024-07-19 ENCOUNTER — Observation Stay (HOSPITAL_COMMUNITY)
Admission: RE | Admit: 2024-07-19 | Discharge: 2024-07-21 | Disposition: A | Discharge status: Home / Self Care | Attending: Neurological Surgery | Admitting: Neurological Surgery

## 2024-07-19 ENCOUNTER — Ambulatory Visit (HOSPITAL_COMMUNITY): Payer: Self-pay | Admitting: Physician Assistant

## 2024-07-19 ENCOUNTER — Encounter (HOSPITAL_COMMUNITY)
Admission: RE | Disposition: A | Discharge status: Home / Self Care | Payer: Self-pay | Source: Home / Self Care | Attending: Neurological Surgery

## 2024-07-19 ENCOUNTER — Encounter (HOSPITAL_COMMUNITY): Payer: Self-pay | Admitting: Neurological Surgery

## 2024-07-19 DIAGNOSIS — Z87891 Personal history of nicotine dependence: Secondary | ICD-10-CM | POA: Diagnosis not present

## 2024-07-19 DIAGNOSIS — M4316 Spondylolisthesis, lumbar region: Secondary | ICD-10-CM | POA: Diagnosis present

## 2024-07-19 DIAGNOSIS — Z8546 Personal history of malignant neoplasm of prostate: Secondary | ICD-10-CM | POA: Diagnosis not present

## 2024-07-19 DIAGNOSIS — M4726 Other spondylosis with radiculopathy, lumbar region: Secondary | ICD-10-CM | POA: Diagnosis not present

## 2024-07-19 DIAGNOSIS — Z79899 Other long term (current) drug therapy: Secondary | ICD-10-CM | POA: Diagnosis not present

## 2024-07-19 DIAGNOSIS — M48062 Spinal stenosis, lumbar region with neurogenic claudication: Secondary | ICD-10-CM | POA: Insufficient documentation

## 2024-07-19 DIAGNOSIS — M5416 Radiculopathy, lumbar region: Secondary | ICD-10-CM | POA: Diagnosis not present

## 2024-07-19 DIAGNOSIS — I10 Essential (primary) hypertension: Secondary | ICD-10-CM | POA: Diagnosis not present

## 2024-07-19 LAB — ABO/RH: ABO/RH(D): A POS

## 2024-07-19 SURGERY — POSTERIOR LUMBAR FUSION 1 LEVEL
Anesthesia: General | Site: Back

## 2024-07-19 MED ORDER — PHENOL 1.4 % MT LIQD: 1.0000 | OROMUCOSAL | Status: DC | PRN

## 2024-07-19 MED ORDER — ONDANSETRON HCL 4 MG/2ML IJ SOLN: 4.0000 mg | Freq: Once | INTRAMUSCULAR | Status: DC | PRN

## 2024-07-19 MED ORDER — LIDOCAINE 2% (20 MG/ML) 5 ML SYRINGE
INTRAMUSCULAR | Status: DC | PRN
  Administered 2024-07-19: 100 mg via INTRAVENOUS

## 2024-07-19 MED ORDER — GLYCOPYRROLATE PF 0.2 MG/ML IJ SOSY
PREFILLED_SYRINGE | INTRAMUSCULAR | Status: DC | PRN
  Administered 2024-07-19: .2 mg via INTRAVENOUS

## 2024-07-19 MED ORDER — PHENYLEPHRINE HCL-NACL 20-0.9 MG/250ML-% IV SOLN
INTRAVENOUS | Status: DC | PRN
  Administered 2024-07-19: 25 ug/min via INTRAVENOUS

## 2024-07-19 MED ORDER — ALBUMIN HUMAN 5 % IV SOLN: INTRAVENOUS | Status: DC | PRN

## 2024-07-19 MED ORDER — DOCUSATE SODIUM 100 MG PO CAPS
100.0000 mg | ORAL_CAPSULE | Freq: Two times a day (BID) | ORAL | Status: DC
  Administered 2024-07-19 – 2024-07-21 (×4): 100 mg via ORAL
  Filled 2024-07-19 (×4): qty 1

## 2024-07-19 MED ORDER — DILTIAZEM HCL 90 MG PO TABS
90.0000 mg | ORAL_TABLET | Freq: Every day | ORAL | Status: DC
  Administered 2024-07-19 – 2024-07-20 (×2): 90 mg via ORAL
  Filled 2024-07-19 (×4): qty 1

## 2024-07-19 MED ORDER — TRETINOIN 0.05 % EX CREA: 1.0000 | TOPICAL_CREAM | Freq: Every evening | CUTANEOUS | Status: DC | PRN

## 2024-07-19 MED ORDER — PROPOFOL 10 MG/ML IV BOLUS
INTRAVENOUS | Status: DC | PRN
  Administered 2024-07-19: 120 mg via INTRAVENOUS
  Administered 2024-07-19: 100 ug/kg/min via INTRAVENOUS

## 2024-07-19 MED ORDER — THROMBIN 5000 UNITS EX SOLR
OROMUCOSAL | Status: DC | PRN
  Administered 2024-07-19: 5 mL via TOPICAL

## 2024-07-19 MED ORDER — ONDANSETRON HCL 4 MG/2ML IJ SOLN: 4.0000 mg | Freq: Four times a day (QID) | INTRAMUSCULAR | Status: DC | PRN

## 2024-07-19 MED ORDER — ACETAMINOPHEN 10 MG/ML IV SOLN: 1000.0000 mg | Freq: Once | INTRAVENOUS | Status: DC | PRN

## 2024-07-19 MED ORDER — NORTRIPTYLINE HCL 10 MG PO CAPS
10.0000 mg | ORAL_CAPSULE | Freq: Every day | ORAL | Status: DC
  Administered 2024-07-19 – 2024-07-20 (×2): 10 mg via ORAL
  Filled 2024-07-19 (×4): qty 1

## 2024-07-19 MED ORDER — CHLORHEXIDINE GLUCONATE 0.12 % MT SOLN
15.0000 mL | Freq: Once | OROMUCOSAL | Status: AC
  Administered 2024-07-19: 15 mL via OROMUCOSAL
  Filled 2024-07-19: qty 15

## 2024-07-19 MED ORDER — CEFAZOLIN SODIUM-DEXTROSE 2-4 GM/100ML-% IV SOLN
INTRAVENOUS | Status: AC
  Filled 2024-07-19: qty 100

## 2024-07-19 MED ORDER — FENTANYL CITRATE (PF) 100 MCG/2ML IJ SOLN
INTRAMUSCULAR | Status: AC
  Filled 2024-07-19: qty 2

## 2024-07-19 MED ORDER — FENTANYL CITRATE (PF) 100 MCG/2ML IJ SOLN
25.0000 ug | INTRAMUSCULAR | Status: DC | PRN
  Administered 2024-07-19 (×2): 50 ug via INTRAVENOUS

## 2024-07-19 MED ORDER — ROSUVASTATIN CALCIUM 20 MG PO TABS
40.0000 mg | ORAL_TABLET | Freq: Every day | ORAL | Status: DC
  Administered 2024-07-19 – 2024-07-21 (×3): 40 mg via ORAL
  Filled 2024-07-19 (×3): qty 2

## 2024-07-19 MED ORDER — ACETAMINOPHEN 650 MG RE SUPP: 650.0000 mg | RECTAL | Status: DC | PRN

## 2024-07-19 MED ORDER — SILVER SULFADIAZINE 1 % EX CREA
TOPICAL_CREAM | Freq: Two times a day (BID) | CUTANEOUS | Status: DC
  Administered 2024-07-19: 1 via TOPICAL
  Filled 2024-07-19: qty 85

## 2024-07-19 MED ORDER — SODIUM CHLORIDE 0.9% FLUSH: 3.0000 mL | INTRAVENOUS | Status: DC | PRN

## 2024-07-19 MED ORDER — FENTANYL CITRATE (PF) 250 MCG/5ML IJ SOLN
INTRAMUSCULAR | Status: DC | PRN
  Administered 2024-07-19: 50 ug via INTRAVENOUS
  Administered 2024-07-19: 100 ug via INTRAVENOUS

## 2024-07-19 MED ORDER — SODIUM CHLORIDE 0.9% FLUSH
3.0000 mL | Freq: Two times a day (BID) | INTRAVENOUS | Status: DC
  Administered 2024-07-19 – 2024-07-21 (×4): 3 mL via INTRAVENOUS

## 2024-07-19 MED ORDER — HYDROCODONE-ACETAMINOPHEN 5-325 MG PO TABS
1.0000 | ORAL_TABLET | ORAL | Status: DC | PRN
  Administered 2024-07-19 – 2024-07-20 (×4): 2 via ORAL
  Filled 2024-07-19 (×4): qty 2

## 2024-07-19 MED ORDER — BUPIVACAINE HCL (PF) 0.5 % IJ SOLN
INTRAMUSCULAR | Status: AC
  Filled 2024-07-19: qty 30

## 2024-07-19 MED ORDER — ROCURONIUM BROMIDE 100 MG/10ML IV SOLN
INTRAVENOUS | Status: DC | PRN
  Administered 2024-07-19: 20 mg via INTRAVENOUS
  Administered 2024-07-19: 80 mg via INTRAVENOUS
  Administered 2024-07-19 (×3): 10 mg via INTRAVENOUS

## 2024-07-19 MED ORDER — BUPIVACAINE HCL (PF) 0.5 % IJ SOLN
INTRAMUSCULAR | Status: DC | PRN
  Administered 2024-07-19: 5 mL
  Administered 2024-07-19: 25 mL

## 2024-07-19 MED ORDER — MENTHOL 3 MG MT LOZG: 1.0000 | LOZENGE | OROMUCOSAL | Status: DC | PRN

## 2024-07-19 MED ORDER — LIDOCAINE-EPINEPHRINE 1 %-1:100000 IJ SOLN
INTRAMUSCULAR | Status: AC
Start: 2024-07-19 — End: 2024-07-19
  Filled 2024-07-19: qty 1

## 2024-07-19 MED ORDER — ZOLPIDEM TARTRATE 5 MG PO TABS
5.0000 mg | ORAL_TABLET | Freq: Every evening | ORAL | Status: DC | PRN
  Administered 2024-07-20: 10 mg via ORAL
  Filled 2024-07-19: qty 1
  Filled 2024-07-19: qty 2

## 2024-07-19 MED ORDER — FENTANYL CITRATE (PF) 250 MCG/5ML IJ SOLN
INTRAMUSCULAR | Status: AC
  Filled 2024-07-19: qty 5

## 2024-07-19 MED ORDER — ALUM & MAG HYDROXIDE-SIMETH 200-200-20 MG/5ML PO SUSP: 30.0000 mL | Freq: Four times a day (QID) | ORAL | Status: DC | PRN

## 2024-07-19 MED ORDER — ORAL CARE MOUTH RINSE: 15.0000 mL | Freq: Once | OROMUCOSAL | Status: AC

## 2024-07-19 MED ORDER — POLYETHYLENE GLYCOL 3350 17 G PO PACK: 17.0000 g | PACK | Freq: Every day | ORAL | Status: DC | PRN

## 2024-07-19 MED ORDER — 0.9 % SODIUM CHLORIDE (POUR BTL) OPTIME
TOPICAL | Status: DC | PRN
  Administered 2024-07-19: 1000 mL

## 2024-07-19 MED ORDER — LACTATED RINGERS IV SOLN: INTRAVENOUS | Status: DC

## 2024-07-19 MED ORDER — PHENYLEPHRINE 80 MCG/ML (10ML) SYRINGE FOR IV PUSH (FOR BLOOD PRESSURE SUPPORT)
PREFILLED_SYRINGE | INTRAVENOUS | Status: DC | PRN
  Administered 2024-07-19 (×2): 80 ug via INTRAVENOUS

## 2024-07-19 MED ORDER — FLUTICASONE PROPIONATE 50 MCG/ACT NA SUSP: 1.0000 | Freq: Every day | NASAL | Status: DC | PRN

## 2024-07-19 MED ORDER — FLEET ENEMA RE ENEM: 1.0000 | ENEMA | Freq: Once | RECTAL | Status: DC | PRN

## 2024-07-19 MED ORDER — BISACODYL 10 MG RE SUPP: 10.0000 mg | Freq: Every day | RECTAL | Status: DC | PRN

## 2024-07-19 MED ORDER — THROMBIN 5000 UNITS EX KIT
PACK | CUTANEOUS | Status: AC
  Filled 2024-07-19: qty 1

## 2024-07-19 MED ORDER — CEFAZOLIN SODIUM-DEXTROSE 2-4 GM/100ML-% IV SOLN
2.0000 g | Freq: Once | INTRAVENOUS | Status: AC
  Administered 2024-07-19: 2 g via INTRAVENOUS

## 2024-07-19 MED ORDER — CEFAZOLIN SODIUM-DEXTROSE 2-4 GM/100ML-% IV SOLN
2.0000 g | Freq: Three times a day (TID) | INTRAVENOUS | Status: AC
  Administered 2024-07-19 – 2024-07-20 (×2): 2 g via INTRAVENOUS
  Filled 2024-07-19 (×2): qty 100

## 2024-07-19 MED ORDER — CEFAZOLIN SODIUM-DEXTROSE 2-4 GM/100ML-% IV SOLN: 2.0000 g | Freq: Three times a day (TID) | INTRAVENOUS | Status: DC

## 2024-07-19 MED ORDER — ALLOPURINOL 100 MG PO TABS
200.0000 mg | ORAL_TABLET | Freq: Every day | ORAL | Status: DC
  Administered 2024-07-20 – 2024-07-21 (×2): 200 mg via ORAL
  Filled 2024-07-19 (×2): qty 2

## 2024-07-19 MED ORDER — PROPOFOL 10 MG/ML IV BOLUS
INTRAVENOUS | Status: AC
  Filled 2024-07-19: qty 20

## 2024-07-19 MED ORDER — ACETAMINOPHEN 10 MG/ML IV SOLN
INTRAVENOUS | Status: AC
  Filled 2024-07-19: qty 100

## 2024-07-19 MED ORDER — SENNA 8.6 MG PO TABS
1.0000 | ORAL_TABLET | Freq: Two times a day (BID) | ORAL | Status: DC
Start: 2024-07-19 — End: 2024-07-21
  Administered 2024-07-19 – 2024-07-21 (×4): 8.6 mg via ORAL
  Filled 2024-07-19 (×4): qty 1

## 2024-07-19 MED ORDER — HYDROMORPHONE HCL 1 MG/ML IJ SOLN
1.0000 mg | INTRAMUSCULAR | Status: DC | PRN
  Administered 2024-07-19 – 2024-07-21 (×5): 1 mg via INTRAVENOUS
  Filled 2024-07-19 (×5): qty 1

## 2024-07-19 MED ORDER — MUPIROCIN 2 % EX OINT: 1.0000 | TOPICAL_OINTMENT | Freq: Two times a day (BID) | CUTANEOUS | 0 refills | Status: AC

## 2024-07-19 MED ORDER — LIDOCAINE-EPINEPHRINE 1 %-1:100000 IJ SOLN
INTRAMUSCULAR | Status: DC | PRN
  Administered 2024-07-19: 5 mL

## 2024-07-19 MED ORDER — MIDAZOLAM HCL 2 MG/2ML IJ SOLN
INTRAMUSCULAR | Status: DC | PRN
  Administered 2024-07-19: 2 mg via INTRAVENOUS

## 2024-07-19 MED ORDER — SUGAMMADEX SODIUM 200 MG/2ML IV SOLN
INTRAVENOUS | Status: DC | PRN
  Administered 2024-07-19: 200 mg via INTRAVENOUS
  Administered 2024-07-19: 100 mg via INTRAVENOUS

## 2024-07-19 MED ORDER — OXYCODONE HCL 5 MG PO TABS
5.0000 mg | ORAL_TABLET | Freq: Once | ORAL | Status: AC | PRN
  Administered 2024-07-19: 5 mg via ORAL

## 2024-07-19 MED ORDER — ONDANSETRON HCL 4 MG/2ML IJ SOLN
INTRAMUSCULAR | Status: DC | PRN
  Administered 2024-07-19: 4 mg via INTRAVENOUS

## 2024-07-19 MED ORDER — ACETAMINOPHEN 10 MG/ML IV SOLN
INTRAVENOUS | Status: DC | PRN
  Administered 2024-07-19: 1000 mg via INTRAVENOUS

## 2024-07-19 MED ORDER — ACETAMINOPHEN 325 MG PO TABS
650.0000 mg | ORAL_TABLET | ORAL | Status: DC | PRN
  Filled 2024-07-19: qty 2

## 2024-07-19 MED ORDER — METHOCARBAMOL 500 MG PO TABS
500.0000 mg | ORAL_TABLET | Freq: Four times a day (QID) | ORAL | Status: DC | PRN
  Administered 2024-07-19 – 2024-07-21 (×5): 500 mg via ORAL
  Filled 2024-07-19 (×5): qty 1

## 2024-07-19 MED ORDER — SODIUM CHLORIDE 0.9 % IV SOLN: 250.0000 mL | INTRAVENOUS | Status: AC

## 2024-07-19 MED ORDER — MIDAZOLAM HCL 2 MG/2ML IJ SOLN
INTRAMUSCULAR | Status: AC
Start: 2024-07-19 — End: 2024-07-19
  Filled 2024-07-19: qty 2

## 2024-07-19 MED ORDER — PANTOPRAZOLE SODIUM 40 MG PO TBEC
80.0000 mg | DELAYED_RELEASE_TABLET | Freq: Every day | ORAL | Status: DC
  Administered 2024-07-20 – 2024-07-21 (×2): 80 mg via ORAL
  Filled 2024-07-19 (×2): qty 2

## 2024-07-19 MED ORDER — OXYCODONE HCL 5 MG/5ML PO SOLN: 5.0000 mg | Freq: Once | ORAL | Status: AC | PRN

## 2024-07-19 MED ORDER — CHLORHEXIDINE GLUCONATE 4 % EX SOLN: 1.0000 | CUTANEOUS | 1 refills | Status: AC

## 2024-07-19 MED ORDER — LACTATED RINGERS IV SOLN: INTRAVENOUS | Status: DC | PRN

## 2024-07-19 MED ORDER — OXYCODONE HCL 5 MG PO TABS
ORAL_TABLET | ORAL | Status: AC
  Filled 2024-07-19: qty 1

## 2024-07-19 MED ORDER — DEXAMETHASONE SODIUM PHOSPHATE 10 MG/ML IJ SOLN
INTRAMUSCULAR | Status: DC | PRN
Start: 2024-07-19 — End: 2024-07-19
  Administered 2024-07-19: 10 mg via INTRAVENOUS

## 2024-07-19 MED ORDER — ONDANSETRON HCL 4 MG PO TABS: 4.0000 mg | ORAL_TABLET | Freq: Four times a day (QID) | ORAL | Status: DC | PRN

## 2024-07-19 MED ORDER — METHOCARBAMOL 1000 MG/10ML IJ SOLN: 500.0000 mg | Freq: Four times a day (QID) | INTRAMUSCULAR | Status: DC | PRN

## 2024-07-19 SURGICAL SUPPLY — 59 items
BAG COUNTER SPONGE SURGICOUNT (BAG) ×1 IMPLANT
BASKET BONE COLLECTION (BASKET) ×1 IMPLANT
BLADE BONE MILL MEDIUM (MISCELLANEOUS) ×1 IMPLANT
BLADE CLIPPER SURG (BLADE) IMPLANT
BUR MATCHSTICK NEURO 3.0 LAGG (BURR) ×1 IMPLANT
CAGE COROENT LG 10X9X23-12 (Cage) IMPLANT
CANISTER SUCTION 3000ML PPV (SUCTIONS) ×1 IMPLANT
CNTNR URN SCR LID CUP LEK RST (MISCELLANEOUS) ×1 IMPLANT
COVER BACK TABLE 60X90IN (DRAPES) ×1 IMPLANT
DERMABOND ADVANCED .7 DNX12 (GAUZE/BANDAGES/DRESSINGS) ×1 IMPLANT
DEVICE DISSECT PLASMABLAD 3.0S (MISCELLANEOUS) ×1 IMPLANT
DRAPE C-ARM 42X72 X-RAY (DRAPES) ×2 IMPLANT
DRAPE HALF SHEET 40X57 (DRAPES) IMPLANT
DRAPE LAPAROTOMY 100X72X124 (DRAPES) ×1 IMPLANT
DRSG OPSITE POSTOP 4X6 (GAUZE/BANDAGES/DRESSINGS) IMPLANT
DURAPREP 26ML APPLICATOR (WOUND CARE) ×1 IMPLANT
DURASEAL APPLICATOR TIP (TIP) IMPLANT
DURASEAL SPINE SEALANT 3ML (MISCELLANEOUS) IMPLANT
ELECTRODE REM PT RTRN 9FT ADLT (ELECTROSURGICAL) ×1 IMPLANT
GAUZE 4X4 16PLY ~~LOC~~+RFID DBL (SPONGE) IMPLANT
GAUZE SPONGE 4X4 12PLY STRL (GAUZE/BANDAGES/DRESSINGS) ×1 IMPLANT
GLOVE BIOGEL PI IND STRL 6.5 (GLOVE) IMPLANT
GLOVE BIOGEL PI IND STRL 8.5 (GLOVE) ×2 IMPLANT
GLOVE ECLIPSE 8.5 STRL (GLOVE) ×2 IMPLANT
GLOVE SURG SS PI 6.5 STRL IVOR (GLOVE) IMPLANT
GOWN STRL REUS W/ TWL LRG LVL3 (GOWN DISPOSABLE) IMPLANT
GOWN STRL REUS W/ TWL XL LVL3 (GOWN DISPOSABLE) IMPLANT
GOWN STRL REUS W/TWL 2XL LVL3 (GOWN DISPOSABLE) ×2 IMPLANT
GRAFT BNE CANC CHIPS 1-8 20CC (Bone Implant) IMPLANT
GRAFT BONE PROTEIOS MED 2.5CC (Orthopedic Implant) IMPLANT
HEMOSTAT POWDER KIT SURGIFOAM (HEMOSTASIS) ×1 IMPLANT
KIT BASIN OR (CUSTOM PROCEDURE TRAY) ×1 IMPLANT
KIT GRAFTMAG DEL NEURO DISP (NEUROSURGERY SUPPLIES) IMPLANT
KIT TURNOVER KIT B (KITS) ×1 IMPLANT
MILL BONE PREP (MISCELLANEOUS) ×1 IMPLANT
NDL HYPO 22X1.5 SAFETY MO (MISCELLANEOUS) ×1 IMPLANT
NEEDLE HYPO 22X1.5 SAFETY MO (MISCELLANEOUS) ×1 IMPLANT
PACK LAMINECTOMY NEURO (CUSTOM PROCEDURE TRAY) ×1 IMPLANT
PAD ARMBOARD POSITIONER FOAM (MISCELLANEOUS) ×3 IMPLANT
PATTIES SURGICAL .5 X1 (DISPOSABLE) ×1 IMPLANT
ROD RELINE-O LORD 5.5X40 (Rod) IMPLANT
SCREW LOCK RELINE 5.5 TULIP (Screw) IMPLANT
SCREW RELINE 2FS POLY 6.5X45 (Screw) IMPLANT
SOLN 0.9% NACL 1000 ML (IV SOLUTION) ×1 IMPLANT
SOLN 0.9% NACL POUR BTL 1000ML (IV SOLUTION) ×1 IMPLANT
SOLN STERILE WATER 1000 ML (IV SOLUTION) ×1 IMPLANT
SOLN STERILE WATER BTL 1000 ML (IV SOLUTION) ×1 IMPLANT
SPIKE FLUID TRANSFER (MISCELLANEOUS) ×1 IMPLANT
SPONGE SURGIFOAM ABS GEL 100 (HEMOSTASIS) IMPLANT
SPONGE T-LAP 4X18 ~~LOC~~+RFID (SPONGE) IMPLANT
SUT PROLENE 6 0 BV (SUTURE) IMPLANT
SUT VIC AB 1 CT1 18XBRD ANBCTR (SUTURE) ×1 IMPLANT
SUT VIC AB 2-0 CP2 18 (SUTURE) ×1 IMPLANT
SUT VIC AB 3-0 SH 8-18 (SUTURE) ×1 IMPLANT
SUT VIC AB 4-0 RB1 18 (SUTURE) ×1 IMPLANT
SYR 3ML LL SCALE MARK (SYRINGE) ×4 IMPLANT
TOWEL GREEN STERILE (TOWEL DISPOSABLE) ×1 IMPLANT
TOWEL GREEN STERILE FF (TOWEL DISPOSABLE) ×1 IMPLANT
TRAY FOLEY MTR SLVR 16FR STAT (SET/KITS/TRAYS/PACK) ×1 IMPLANT

## 2024-07-19 NOTE — Anesthesia Procedure Notes (Signed)
 Procedure Name: Intubation Date/Time: 07/19/2024 11:04 AM  Performed by: Bess Josette ORN, CRNAPre-anesthesia Checklist: Patient identified, Emergency Drugs available, Suction available, Patient being monitored and Timeout performed Patient Re-evaluated:Patient Re-evaluated prior to induction Oxygen Delivery Method: Circle system utilized Preoxygenation: Pre-oxygenation with 100% oxygen Induction Type: IV induction Ventilation: Mask ventilation without difficulty Laryngoscope Size: Glidescope and 3 Grade View: Grade I Tube type: Oral Tube size: 7.5 mm Number of attempts: 1 Airway Equipment and Method: Stylet Placement Confirmation: ETT inserted through vocal cords under direct vision, CO2 detector, positive ETCO2 and breath sounds checked- equal and bilateral Secured at: 22 cm Tube secured with: Tape Dental Injury: Teeth and Oropharynx as per pre-operative assessment  Comments: Neck left in neutral postion per pt comfort

## 2024-07-19 NOTE — H&P (Signed)
 Isaiah Price is an 73 y.o. male.   Chief Complaint: back and bilateral leg HPI: the patient is a 73 year old individual who has been a long-standing patient of our practice.  He's had anterior cervical decompression as early as 1991.  He's had a lumbar laminectomy by me in 2023 at the L5-S1 level using a Metrix technique.  The patient has since developed a spondylolisthesis at L4-L5 with moderately severe stenosis.  He has had epidural steroid injections and conservative management but has failed that now and is requiring surgical decompression and stabilization via lumbar interbody technique.  Past Medical History:  Diagnosis Date   Achilles tendinitis, left leg    At risk for sleep apnea    STOP--BANG SCORE= 5  (routed to pt's pcp in epic 11-13-2018)   Cervicogenic headache 06/08/2021   Chronic insomnia 12/31/2015   Chronic neck pain    Congenital pectus excavatum    per pt had pulmonary test ,  60% restrictive by states asymptomatic   DDD (degenerative disc disease), cervical    Dyspnea    Epicondylitis, lateral, right    Gait abnormality 06/08/2021   GERD (gastroesophageal reflux disease)    History of exercise intolerance    09-02-2017  ETT,  negative for ischemia   History of pericarditis 1985   HTN (hypertension)    Hypercholesteremia 12/31/2015   Iritis    IT band syndrome 05/06/2014   Lower urinary tract symptoms (LUTS)    Neuropathy    bilateral arms with tingling and numbness at times due to cervical neck   OA (osteoarthritis)    Pneumonia    approximately 2015   Polyarthralgia    Prostate cancer [facility])    urologist-  dr renda--- Stage T1c, Gleason 3+4, 06/08/21 recurrent prostate cancer   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   CATARACT EXTRACTION W/ INTRAOCULAR LENS  IMPLANT, BILATERAL  2013   LAMINECTOMY  2023   LYMPHADENECTOMY Bilateral 11/16/2018   Procedure: LYMPHADENECTOMY, PELVIC;  Surgeon: renda Glance, MD;  Location: [facility];  Service: Urology;  Laterality: Bilateral;   ROBOT ASSISTED LAPAROSCOPIC RADICAL PROSTATECTOMY N/A 11/16/2018   Procedure: XI ROBOTIC ASSISTED LAPAROSCOPIC RADICAL PROSTATECTOMY LEVEL 2;  Surgeon: renda Glance, MD;  Location: [facility];  Service: Urology;  Laterality: N/A;   SHOULDER SURGERY Left age 53   TONSILLECTOMY  child    Family History  Problem Relation Age of Onset   Heart disease Father        Aortic Valve Stenosis, CABG 48   Hypertension Father    Macular degeneration Mother    Heart disease Mother        MVR   Hypertension Mother    Osteosarcoma Paternal Uncle    Osteosarcoma Son    Social History:  reports that he quit smoking about 12 years ago. His smoking use included cigarettes. He started smoking about 37 years ago. He has a 25 pack-year smoking history. He has never used smokeless tobacco. He reports current alcohol use. He reports that he does not  use drugs.  Allergies:  Allergies  Allergen Reactions   Nsaids Other (See Comments)    Causes GI bleeds (numerous times)   Brimonidine Itching and Other (See Comments)    redness Abdominal Pain   Gabapentin  Other (See Comments)    Couldn't talk   Meloxicam Other (See Comments)   Promethazine  Hcl     Vision changes   Trazodone     Bladder incontinence    Medications Prior to Admission  Medication Sig Dispense Refill   acetaminophen  (TYLENOL ) 500 MG tablet Take 1,000 mg by mouth every 6 (six) hours as needed for moderate pain (pain score 4-6).     allopurinol (ZYLOPRIM) 100 MG tablet Take 200 mg by mouth daily.     diltiazem  (CARDIZEM ) 90  MG tablet Take 90 mg by mouth at bedtime.      fluticasone  (FLONASE ) 50 MCG/ACT nasal spray Place 1 spray into the nose daily as needed for allergies.      HYDROcodone -acetaminophen  (NORCO) 10-325 MG tablet TAKE 1 TABLET EVERY 6 HOURS AS NEEDED FOR MODERATE TO SEVERE PAIN. Use sparingly (Patient taking differently: Take 0.5-1 tablets by mouth every 6 (six) hours as needed for severe pain (pain score 7-10).) 20 tablet 0   nortriptyline  (PAMELOR ) 10 MG capsule Take 2 capsules at night 180 capsule 1   omeprazole (PRILOSEC) 40 MG capsule Take 40 mg by mouth daily.      rosuvastatin  (CRESTOR ) 40 MG tablet TAKE 1 TABLET BY MOUTH DAILY (Patient taking differently: Take 40 mg by mouth at bedtime.) 90 tablet 3   tiZANidine  (ZANAFLEX ) 4 MG tablet Take 1 tablet (4 mg total) by mouth every 8 (eight) hours as needed for muscle spasms. 30 tablet 1   tretinoin (RETIN-A) 0.05 % cream Apply 1 Application topically at bedtime as needed (irritation).     zolpidem  (AMBIEN ) 10 MG tablet Take 5-10 mg by mouth at bedtime as needed for sleep.     Polyvinyl Alcohol-Povidone (REFRESH OP) Place 1 drop into both eyes 2 (two) times daily as needed (dry eyes).       Results for orders placed or performed during the hospital encounter of 07/19/24 (from the past 48 hours)  ABO/Rh     Status: None   Collection Time: 07/19/24  8:37 AM  Result Value Ref Range   ABO/RH(D)      A POS Performed at [facility], [address] [place], [place] [address]    No results found.  Review of Systems  Constitutional:  Positive for activity change.  Musculoskeletal:  Positive for back pain, gait problem, myalgias and neck stiffness.  Neurological:  Positive for weakness and numbness.  All other systems reviewed and are negative.   Blood pressure (!) 141/80, pulse 98, temperature 98.4 F (36.9 C), temperature source Oral, resp. rate 18, height 6' 4 (1.93 m), weight 95.3 kg, SpO2 96%. Physical Exam Constitutional:       Appearance: Normal appearance.  HENT:     Head: Normocephalic and atraumatic.     Right Ear: Tympanic membrane, ear canal and external ear normal.     Left Ear: Tympanic membrane, ear canal and external ear normal.     Nose: Nose normal.     Mouth/Throat:     Mouth: Mucous membranes are moist.     Pharynx: Oropharynx is clear.  Eyes:     Extraocular Movements: Extraocular movements intact.     Conjunctiva/sclera: Conjunctivae normal.     Pupils: Pupils are equal,  round, and reactive to light.  Cardiovascular:     Rate and Rhythm: Normal rate and regular rhythm.     Pulses: Normal pulses.     Heart sounds: Normal heart sounds.  Pulmonary:     Effort: Pulmonary effort is normal.     Breath sounds: Normal breath sounds.  Abdominal:     General: Abdomen is flat.     Palpations: Abdomen is soft.  Musculoskeletal:     Cervical back: Normal range of motion.     Comments: Straight leg raising is +45.  Skin:    General: Skin is warm and dry.     Capillary Refill: Capillary refill takes less than 2 seconds.  Neurological:     Mental Status: He is alert.     Comments: Mild weakness and tibialis anterior 4 out of 5 is noted.  Tone and bulk in the major muscle groups is intact.  No fasciculations are noted.  Straight leg raising is +45.  Deep tendon reflexes are absent in the patellae and Achilles.  Upper extremity strength and reflexes are normal.  Cranial nerve examination is normal.  Psychiatric:        Mood and Affect: Mood normal.        Behavior: Behavior normal.        Thought Content: Thought content normal.        Judgment: Judgment normal.      Assessment/Plan Spondylolisthesis L4-L5 with stenosis neurogenic claudication, lumbar radiculopathy.  Plan: Posterior lumbar interbody arthrodesis L4-L5.  Victory JINNY Gens, MD 07/19/2024, 10:05 AM

## 2024-07-19 NOTE — Op Note (Signed)
 Date of surgery: 07/19/2024 Preoperative diagnosis: Spondylolisthesis L4-L5 with lumbar radiculopathy, neurogenic claudication. Postoperative diagnosis: Same Procedure: Bilateral laminectomy decompression of L4 and L5 with more worked and required for simple interbody technique.  Posterior lumbar interbody arthrodesis L4-L5 posterolateral arthrodesis with local autograft and allograft and Proteus.  Pedicle screw fixation L4-L5 with fluoroscopic guidance. Surgeon: Victory Gens First Assistant: Dorn Glade, MD Anesthesia: General endotracheal Indications: swan zayed is a 73 year old individual whose had a spondylolisthesis evolved over the last several years.  He has severe pain in the back and legs he has been advised regarding the need for surgical decompression and stabilization.  Procedure: Patient was brought to the operating room supine on the stretcher.  After the smooth induction of general tracheal anesthesia he was carefully turned prone.  The back was prepped with alcohol DuraPrep and draped in a sterile fashion.  Midline incision was created in the lower lumbar spine carried down to the lumbodorsal fascia which was opened on either side of midline.  First identifiable disc space was noted to be that of L4-L5.  The dissection was then carried out over the facet joints at L4-L5 into the intertransverse space at L3 and L4.  Laminectomy was then created removing the inferior margin lamina of L4 out to including the entirety of the facet at L4-L5.  The redundant ligament was removed and the common dural tube was explored and decompression of the L3 and the L4 nerve roots was undertaken.  Once this was thoroughly done and the nerve roots were isolated the space was isolated.  Complete discectomy was then performed on both sides at L4 and L5 removing the entirety of the disc at L4-L5 this was done with the help of Dr. Dorn Glade who maintained retraction while I worked from 1 side and conversely I  retracted the common dural tube and the nerve roots while he worked from the opposite side once complete discectomy was completed the interspace was sized for appropriate size spacer and was felt that a 10 mm tall 23 mm long 12 degree lordotic spacer would fit best into this interval.  2 the spacers were filled with autograft allograft and Proteus the allograft was 20 cc of cortical cancellous chips which were ground together with the autograft.  Then approximately 12 cc of bone graft was packed into the interspace along with 2 spacers.  Attention was then turned to the lateral gutters which were decorticated and additional 6 cc of bone graft was packed into each lateral gutter pedicle entry sites were chosen at L4 and L5 and 6.5 mm x 45 mm pedicle screws were placed under fluoroscopic guidance this was also done with the help of Dr. Glade.  He assessed the positioning of the fluoroscopic images while we each placed screws on the ipsilateral side that were standing.  Then 40 mm precontoured rods were used to connect the screws together in a neutral construct.  Final radiographs were obtained in AP and lateral projection and when verified the wound was then checked for hemostasis the pads of the L3 and L4 nerve roots and the common dural tube were checked to make sure they are well decompressed and when verified the retractors were removed the lumbodorsal fascia was closed with #1 Vicryl in interrupted fashion 25 cc of half percent Marcaine  was injected into the paraspinous fascia and tissues and then 2-0 and 3-0 Vicryl was used to close the subcutaneous and subcuticular tissues respectively along with several 4-0 Vicryl's and the final subcuticular  closure Dermabond was placed on the skin.  Blood loss was estimated at 150 cc.

## 2024-07-19 NOTE — Progress Notes (Signed)
 Orthopedic Tech Progress Note Patient Details:  Isaiah Price 09-20-1951 [id number]  Ortho Devices Type of Ortho Device: Lumbar corsett Ortho Device/Splint Location: Back Ortho Device/Splint Interventions: Ordered      [name] A [name] 07/19/2024, 5:07 PM

## 2024-07-19 NOTE — Transfer of Care (Signed)
 Immediate Anesthesia Transfer of Care Note  Patient: Isaiah Price  Procedure(s) Performed: POSTERIOR LUMBAR FUSION LUMBAR FOUR-FIVE (Back)  Patient Location: PACU  Anesthesia Type:General  Level of Consciousness: awake  Airway & Oxygen Therapy: Patient connected to face mask oxygen  Post-op Assessment: Report given to RN  Post vital signs: stable  Last Vitals:  Vitals Value Taken Time  BP 131/86 07/19/24 14:23  Temp    Pulse 85 07/19/24 14:25  Resp 12 07/19/24 14:26  SpO2 99 % 07/19/24 14:25  Vitals shown include unfiled device data.  Last Pain:  Vitals:   07/19/24 0838  TempSrc:   PainSc: 4       Patients Stated Pain Goal: 2 (07/19/24 9161)  Complications: No notable events documented.

## 2024-07-19 NOTE — Anesthesia Postprocedure Evaluation (Signed)
 Anesthesia Post Note  Patient: Isaiah Price  Procedure(s) Performed: POSTERIOR LUMBAR FUSION LUMBAR FOUR-FIVE (Back)     Patient location during evaluation: PACU Anesthesia Type: General Level of consciousness: awake and alert Pain management: pain level controlled Vital Signs Assessment: post-procedure vital signs reviewed and stable Respiratory status: spontaneous breathing, nonlabored ventilation, respiratory function stable and patient connected to nasal cannula oxygen Cardiovascular status: blood pressure returned to baseline and stable Postop Assessment: no apparent nausea or vomiting Anesthetic complications: no   No notable events documented.  Last Vitals:  Vitals:   07/19/24 1545 07/19/24 1605  BP: (!) 140/87 (!) 150/94  Pulse: 88 92  Resp: 13 20  Temp:    SpO2: 99% 98%    Last Pain:  Vitals:   07/19/24 1546  TempSrc:   PainSc: 7                  Lynwood MARLA Cornea

## 2024-07-20 DIAGNOSIS — M4316 Spondylolisthesis, lumbar region: Secondary | ICD-10-CM | POA: Diagnosis not present

## 2024-07-20 MED ORDER — MUPIROCIN 2 % EX OINT
1.0000 | TOPICAL_OINTMENT | Freq: Two times a day (BID) | CUTANEOUS | Status: DC
  Administered 2024-07-20 – 2024-07-21 (×3): 1 via NASAL
  Filled 2024-07-20: qty 22

## 2024-07-20 MED ORDER — DEXAMETHASONE 1 MG PO TABS: ORAL_TABLET | ORAL | 0 refills | Status: AC

## 2024-07-20 MED ORDER — DEXAMETHASONE 4 MG PO TABS
2.0000 mg | ORAL_TABLET | Freq: Two times a day (BID) | ORAL | Status: DC
  Administered 2024-07-20 – 2024-07-21 (×2): 2 mg via ORAL
  Filled 2024-07-20 (×2): qty 1

## 2024-07-20 MED ORDER — OXYCODONE-ACETAMINOPHEN 5-325 MG PO TABS
1.0000 | ORAL_TABLET | ORAL | Status: DC | PRN
  Administered 2024-07-20 (×3): 2 via ORAL
  Administered 2024-07-20: 1 via ORAL
  Administered 2024-07-21 (×2): 2 via ORAL
  Filled 2024-07-20 (×4): qty 2
  Filled 2024-07-20: qty 1
  Filled 2024-07-20: qty 2

## 2024-07-20 MED ORDER — CHLORHEXIDINE GLUCONATE CLOTH 2 % EX PADS
6.0000 | MEDICATED_PAD | Freq: Every day | CUTANEOUS | Status: DC
  Administered 2024-07-20 – 2024-07-21 (×2): 6 via TOPICAL

## 2024-07-20 MED ORDER — OXYCODONE-ACETAMINOPHEN 5-325 MG PO TABS: 1.0000 | ORAL_TABLET | ORAL | 0 refills | Status: AC | PRN

## 2024-07-20 MED ORDER — METHOCARBAMOL 500 MG PO TABS: 500.0000 mg | ORAL_TABLET | Freq: Four times a day (QID) | ORAL | 3 refills | Status: AC | PRN

## 2024-07-20 NOTE — Care Management Obs Status (Signed)
 MEDICARE OBSERVATION STATUS NOTIFICATION   Patient Details  Name: Isaiah Price MRN: [id number] Date of Birth: November 30, 1950   Medicare Observation Status Notification Given:  Yes    Jon Cruel 07/20/2024, 9:22 AM

## 2024-07-20 NOTE — Evaluation (Signed)
 Occupational Therapy Evaluation Patient Details Name: Isaiah Price MRN: [id number] DOB: 03/22/1951 Today's Date: 07/20/2024   History of Present Illness   Pt is a 73yo male who underwent PLIF L4-5 on 10/2. PMH: RA, HTN, GERD,  ACDF, laminectomy.     Clinical Impressions Pt has been limited by back pain, largely NOT using DME, but with decreased mobility and increased need for assist with LB ADL. Today Pt presents with 7/10 pain, able to perform bed mobility with min A, perform LB dressing with AE with GCA after education. Transfers with min A, mobility with RW at GCA/supervision. Pt educated and reviewed handout. While he can repeat back precautions  he requires mod cues for functional maintenance of back precautions during functional tasks. He was able to don/doff his brace. Pt provided with sock donner. Pt will benefit from continued skilled OT in the acute setting as well as afterwards at the [place] level to maximize safety and independence in ADL and functional transfers.      If plan is discharge home, recommend the following:   A little help with bathing/dressing/bathroom;Assistance with cooking/housework;Assist for transportation;Help with stairs or ramp for entrance     Functional Status Assessment   Patient has had a recent decline in their functional status and demonstrates the ability to make significant improvements in function in a reasonable and predictable amount of time.     Equipment Recommendations   BSC/3in1 (provided with sock donner)     Recommendations for Other Services   PT consult     Precautions/Restrictions   Precautions Precautions: Back Precaution Booklet Issued: Yes (comment) Recall of Precautions/Restrictions: Impaired Precaution/Restrictions Comments: verbal understanding but requires cues to adhere functionally Restrictions Weight Bearing Restrictions Per Provider Order: No     Mobility Bed Mobility Overal bed mobility: Needs  Assistance Bed Mobility: Rolling, Sidelying to Sit, Sit to Sidelying Rolling: Min assist Sidelying to sit: Contact guard assist (increased time and effort)     Sit to sidelying: Min assist General bed mobility comments: max directional verbal cues for technique, increased time, minA for achieve full sidelying and return to supine, minA for LE elevation    Transfers Overall transfer level: Needs assistance Equipment used: Rolling walker (2 wheels) Transfers: Sit to/from Stand Sit to Stand: Contact guard assist           General transfer comment: increased time, pt with report of catching in R hip however improved with amb      Balance Overall balance assessment: Needs assistance Sitting-balance support: Feet supported, Bilateral upper extremity supported Sitting balance-Leahy Scale: Fair     Standing balance support: Bilateral upper extremity supported, During functional activity, Reliant on assistive device for balance Standing balance-Leahy Scale: Fair Standing balance comment: reliant on RW for amb                           ADL either performed or assessed with clinical judgement   ADL Overall ADL's : Needs assistance/impaired Eating/Feeding: Independent   Grooming: Supervision/safety;Standing Grooming Details (indicate cue type and reason): educated on compensatory strategies to maintain BLT precautions including cup method and using toilet as seated rest break if needed Upper Body Bathing: Contact guard assist;Cueing for compensatory techniques;With adaptive equipment Upper Body Bathing Details (indicate cue type and reason): Pt has long handle brush that he plans to use. educated to not scrub sx site Lower Body Bathing: Supervison/ safety;Sitting/lateral leans;With adaptive equipment Lower Body Bathing Details (indicate cue type  and reason): Pt has long handle brush that he plans on using Upper Body Dressing : Minimal assistance;Sitting Upper Body  Dressing Details (indicate cue type and reason): Pt able to demonstrate don/doff of brace with cues for sequencing, education for brace Lower Body Dressing: Minimal assistance;With adaptive equipment;With caregiver independent assisting;Sit to/from stand Lower Body Dressing Details (indicate cue type and reason): Pt able to demonstrate use of grabber/reacher and sock donner after education. Toilet Transfer: Minimal assistance;Ambulation;Rolling walker (2 wheels) Toilet Transfer Details (indicate cue type and reason): cues for back precautions with RW during turning Toileting- Clothing Manipulation and Hygiene: Minimal assistance;Sit to/from stand Toileting - Clothing Manipulation Details (indicate cue type and reason): educated on strategies for rear peri care Tub/ Shower Transfer: Walk-in shower;Contact guard assist;Ambulation;Shower seat;Rolling walker (2 wheels) Tub/Shower Transfer Details (indicate cue type and reason): has built in seat and hand held as well as long handle brush/sponge Functional mobility during ADLs: Supervision/safety;Rolling walker (2 wheels)       Vision Ability to See in Adequate Light: 0 Adequate Patient Visual Report: No change from baseline Vision Assessment?: No apparent visual deficits     Perception         Praxis         Pertinent Vitals/Pain Pain Assessment Pain Assessment: 0-10 Pain Score: 7  Pain Location: back, surgical Pain Descriptors / Indicators: Sharp, Spasm, Grimacing Pain Intervention(s): Monitored during session, Repositioned     Extremity/Trunk Assessment Upper Extremity Assessment Upper Extremity Assessment: Overall WFL for tasks assessed;Right hand dominant (RA at baseline with sx treatment in past)   Lower Extremity Assessment Lower Extremity Assessment: Defer to PT evaluation   Cervical / Trunk Assessment Cervical / Trunk Assessment: Back Surgery   Communication Communication Communication: No apparent difficulties    Cognition Arousal: Alert Behavior During Therapy: Anxious Cognition: No family/caregiver present to determine baseline, Cognition impaired     Awareness: Intellectual awareness intact, Online awareness impaired Memory impairment (select all impairments): Short-term memory Attention impairment (select first level of impairment): Selective attention (suspect baseline) Executive functioning impairment (select all impairments): Reasoning, Problem solving OT - Cognition Comments: Pt pleasant and cooperative. Pt requires cues for safety and back precautions. Pt with decreased application of back precautions to functional tasks.                 Following commands: Intact       Cueing  General Comments   Cueing Techniques: Verbal cues;Gestural cues;Tactile cues      Exercises     Shoulder Instructions      Home Living Family/patient expects to be discharged to:: Private residence Living Arrangements: Spouse/significant other Available Help at Discharge: Family;Available PRN/intermittently Type of Home: House Home Access: Stairs to enter [employer] of Steps: 1 (1 platform step in front, 3 steps without handrail in back) Entrance Stairs-Rails: None Home Layout: One level     Bathroom Shower/Tub: Producer, television/film/video: Handicapped height Bathroom Accessibility: Yes   Home Equipment: Hand held shower head;Adaptive equipment;BSC/3in1;Shower seat - built in Lexicographer) Adaptive Equipment: Reacher;Sock aid Additional Comments: wife has rollator      Prior Functioning/Environment Prior Level of Function : Independent/Modified Independent             Mobility Comments: wasn't using AD but not mobilizing well due to pain ADLs Comments: indep with increased time    OT Problem List: Decreased range of motion;Decreased activity tolerance;Impaired balance (sitting and/or standing);Decreased safety awareness;Decreased cognition;Decreased knowledge of  use of DME or  AE;Decreased knowledge of precautions;Pain   OT Treatment/Interventions: Self-care/ADL training;DME and/or AE instruction;Energy conservation;Therapeutic activities;Patient/family education;Balance training      OT Goals(Current goals can be found in the care plan section)   Acute Rehab OT Goals Patient Stated Goal: get back to independent OT Goal Formulation: With patient Time For Goal Achievement: 08/03/24 Potential to Achieve Goals: Good ADL Goals Pt Will Perform Grooming: with modified independence;standing Pt Will Perform Upper Body Dressing: with set-up;sitting Pt Will Perform Lower Body Dressing: with supervision;sit to/from stand;with adaptive equipment Pt Will Transfer to Toilet: with modified independence;ambulating Pt Will Perform Toileting - Clothing Manipulation and hygiene: with supervision;sit to/from stand Additional ADL Goal #1: Pt will perform bed mobility as precursor to ADL with at supervision level maintaining back precautions   OT Frequency:  Min 2X/week    Co-evaluation              AM-PAC OT 6 Clicks Daily Activity     Outcome Measure Help from another person eating meals?: None Help from another person taking care of personal grooming?: A Little Help from another person toileting, which includes using toliet, bedpan, or urinal?: A Little Help from another person bathing (including washing, rinsing, drying)?: A Little Help from another person to put on and taking off regular upper body clothing?: A Little Help from another person to put on and taking off regular lower body clothing?: A Little 6 Click Score: 19   End of Session Equipment Utilized During Treatment: Rolling walker (2 wheels);Back brace Nurse Communication: Mobility status  Activity Tolerance: Patient tolerated treatment well Patient left: in bed;with call bell/phone within reach  OT Visit Diagnosis: Unsteadiness on feet (R26.81);Other abnormalities of gait and  mobility (R26.89);Muscle weakness (generalized) (M62.81);Pain Pain - part of body:  (back - surgical site)                Time: 8968-8890 OT Time Calculation (min): 38 min Charges:  OT General Charges $OT Visit: 1 Visit OT Evaluation $OT Eval Moderate Complexity: 1 Mod OT Treatments $Self Care/Home Management : 8-22 mins  Leita DEL OTR/L Acute Rehabilitation Services Office: [phone]  Leita JINNY Odea 07/20/2024, 1:21 PM

## 2024-07-20 NOTE — Progress Notes (Signed)
 Home health recommended for pt at d/c. CM provided choice and they had no preference. HH arranged with [facility]. Information on the AVS. [facility] will contact him for the first home visit.

## 2024-07-20 NOTE — Progress Notes (Signed)
 Patient ID: Isaiah Price, male   DOB: 1951-02-14, 73 y.o.   MRN: [id number] Patient had challenging night last night.  Hydrocodone  was not covering his pain.  He is doing better on Percocet this morning.  Vital signs have been stable.  Motor function is good although he noted some weakness in the right leg proximally.  At this point he is moving it and flexing it quite well his incision is clean and dry.  Hopeful for discharge tomorrow.  Dr. Lanis will be by in my absence to make that happen.  I have started him on a Decadron  taper and I have sent in his discharge pain meds.

## 2024-07-20 NOTE — Evaluation (Signed)
 Physical Therapy Evaluation Patient Details Name: Isaiah Price MRN: [id number] DOB: 1950/12/19 Today's Date: 07/20/2024  History of Present Illness  Pt is a 73yo male who underwent PLIF L4-5 on 10/2. PMH: RA, HTN, GERD,  ACDF, laminectomy.  Clinical Impression  Pt underwent above surgery. Pt presenting with surgical back pain, anxiety regarding going home and pain, requires RW for safe ambulation, requires minA for transfers, and verbal cues to adhere to back precautions. Pt reports family to be in and out starting tomorrow/Sunday. Pts LSO re-fitted for optimal support. Pt requiring assist for proper donning of LSO. Acute PT to cont to follow. Pt to benefit from RW and HHPT upon d/c to progress towards independent function.        If plan is discharge home, recommend the following: A little help with walking and/or transfers;A little help with bathing/dressing/bathroom;Help with stairs or ramp for entrance;Assist for transportation   Can travel by private vehicle        Equipment Recommendations Rolling walker (2 wheels) (tall, pt 6'4)  Recommendations for Other Services       Functional Status Assessment Patient has had a recent decline in their functional status and demonstrates the ability to make significant improvements in function in a reasonable and predictable amount of time.     Precautions / Restrictions Precautions Precautions: Back Precaution Booklet Issued: Yes (comment) Recall of Precautions/Restrictions: Impaired Precaution/Restrictions Comments: verbal understanding but requires cues to adhere functionally Restrictions Weight Bearing Restrictions Per Provider Order: No      Mobility  Bed Mobility Overal bed mobility: Needs Assistance Bed Mobility: Rolling, Sidelying to Sit, Sit to Sidelying Rolling: Min assist Sidelying to sit: Min assist     Sit to sidelying: Min assist General bed mobility comments: max directional verbal cues for technique, increased  time, minA for achieve full sidelying and return to supine, minA for LE elevation    Transfers Overall transfer level: Needs assistance Equipment used: Rolling walker (2 wheels) Transfers: Sit to/from Stand Sit to Stand: Contact guard assist           General transfer comment: increased time, pt with report of catching in R hip however improved with amb    Ambulation/Gait Ambulation/Gait assistance: Contact guard assist Gait Distance (Feet): 200 Feet Assistive device: Rolling walker (2 wheels) Gait Pattern/deviations: Step-through pattern, Decreased stride length, Antalgic Gait velocity: dec Gait velocity interpretation: 1.31 - 2.62 ft/sec, indicative of limited community ambulator   General Gait Details: verbal cues to stay in walker and stand up right however difficulty maintaining. Pt with improved step length with increased time  Stairs Stairs: Yes Stairs assistance: Contact guard assist Stair Management: Two rails, Step to pattern, Forwards Number of Stairs: 1 General stair comments: to mimic home set up with platform  Wheelchair Mobility     Tilt Bed    Modified Rankin (Stroke Patients Only)       Balance Overall balance assessment: Needs assistance Sitting-balance support: Feet supported, Bilateral upper extremity supported Sitting balance-Leahy Scale: Fair     Standing balance support: Bilateral upper extremity supported, During functional activity, Reliant on assistive device for balance Standing balance-Leahy Scale: Fair Standing balance comment: reliant on RW for amb                             Pertinent Vitals/Pain Pain Assessment Pain Assessment: 0-10 Pain Score: 7  Pain Location: back, surgical Pain Descriptors / Indicators: Sharp Pain Intervention(s): Monitored during  session    Home Living Family/patient expects to be discharged to:: Private residence Living Arrangements: Spouse/significant other Available Help at  Discharge: Family;Available PRN/intermittently Type of Home: House Home Access: Stairs to enter Entrance Stairs-Rails: None Entrance Stairs-Number of Steps: 1 (1 platform step in front, 3 steps without handrail in back)   Home Layout: One level Home Equipment: Shower seat;Hand held shower head;Adaptive equipment;BSC/3in1 Lexicographer) Additional Comments: wife has rollator    Prior Function Prior Level of Function : Independent/Modified Independent             Mobility Comments: wasn't using AD but not mobilizing well due to pain ADLs Comments: indep with increased time     Extremity/Trunk Assessment   Upper Extremity Assessment Upper Extremity Assessment: Defer to OT evaluation    Lower Extremity Assessment Lower Extremity Assessment: RLE deficits/detail;LLE deficits/detail RLE Deficits / Details: generalized weakness with Catching in the hip but worked out during ambulation LLE Deficits / Details: generalized weakness    Cervical / Trunk Assessment Cervical / Trunk Assessment: Back Surgery  Communication   Communication Communication: No apparent difficulties    Cognition Arousal: Alert Behavior During Therapy: Anxious   PT - Cognitive impairments: No family/caregiver present to determine baseline                       PT - Cognition Comments: pt with delayed processing, impaired memory, and hyperfocused on staying one more night. Pt with noted anxiety regarding pain and going home. difficulty understanding how to don/doff lumbar corsett Following commands: Intact       Cueing Cueing Techniques: Verbal cues, Gestural cues, Tactile cues     General Comments General comments (skin integrity, edema, etc.): bilat hand swelling, reports RA    Exercises     Assessment/Plan    PT Assessment Patient needs continued PT services  PT Problem List Decreased strength;Decreased activity tolerance;Decreased balance;Decreased mobility       PT Treatment  Interventions DME instruction;Gait training;Stair training;Functional mobility training;Therapeutic activities;Therapeutic exercise;Balance training;Neuromuscular re-education    PT Goals (Current goals can be found in the Care Plan section)  Acute Rehab PT Goals Patient Stated Goal: pain under control PT Goal Formulation: With patient Time For Goal Achievement: 08/03/24 Potential to Achieve Goals: Good    Frequency Min 5X/week     Co-evaluation               AM-PAC PT 6 Clicks Mobility  Outcome Measure Help needed turning from your back to your side while in a flat bed without using bedrails?: A Little Help needed moving from lying on your back to sitting on the side of a flat bed without using bedrails?: A Little Help needed moving to and from a bed to a chair (including a wheelchair)?: A Little Help needed standing up from a chair using your arms (e.g., wheelchair or bedside chair)?: A Little Help needed to walk in hospital room?: A Little Help needed climbing 3-5 steps with a railing? : A Little 6 Click Score: 18    End of Session Equipment Utilized During Treatment: Gait belt;Back brace Activity Tolerance: Patient limited by pain Patient left: in bed;with call bell/phone within reach;with nursing/sitter in room Nurse Communication: Mobility status;Patient requests pain meds PT Visit Diagnosis: Unsteadiness on feet (R26.81);Pain Pain - Right/Left:  (back) Pain - part of body:  (back)    Time: 9184-9144 PT Time Calculation (min) (ACUTE ONLY): 40 min   Charges:   PT Evaluation $PT Eval  Moderate Complexity: 1 Mod PT Treatments $Gait Training: 8-22 mins $Therapeutic Activity: 8-22 mins PT General Charges $$ ACUTE PT VISIT: 1 Visit         Norene Ames, PT, DPT Acute Rehabilitation Services Secure chat preferred Office #: [phone]   Norene CHRISTELLA Ames 07/20/2024, 9:23 AM

## 2024-07-21 DIAGNOSIS — M4316 Spondylolisthesis, lumbar region: Secondary | ICD-10-CM | POA: Diagnosis not present

## 2024-07-21 NOTE — Progress Notes (Signed)
 [employer] just delivered DMEs.

## 2024-07-21 NOTE — Plan of Care (Signed)
  Problem: Education: Goal: Knowledge of General Education information will improve Description: Including pain rating scale, medication(s)/side effects and non-pharmacologic comfort measures Outcome: Adequate for Discharge   Problem: Activity: Goal: Risk for activity intolerance will decrease Outcome: Adequate for Discharge   Problem: Nutrition: Goal: Adequate nutrition will be maintained Outcome: Adequate for Discharge   Problem: Pain Managment: Goal: General experience of comfort will improve and/or be controlled Outcome: Adequate for Discharge

## 2024-07-21 NOTE — Progress Notes (Signed)
 Pt d/c to home with [facility] as ordered, He remains alert/oriented in no apparent distress, Pt's spouse is responsible for his transport. D/C staff assisting pt's discharges.

## 2024-07-21 NOTE — TOC Transition Note (Addendum)
 Transition of Care [facility]) - Discharge Note   Patient Details  Name: Isaiah Price MRN: [id number] Date of Birth: [birth year]  Transition [facility]) CM/SW Contact:  Corean JAYSON Canary, RN Phone Number: 07/21/2024, 10:16 AM   Clinical Narrative:    Patient will transition home today with home health DME rw and BSC ordered  from [facility]      Final next level of care: Home w Home Health Services     Patient Goals and CMS Choice            Discharge Placement             Home with home health          Discharge Plan and Services Additional resources added to the After Visit Summary for                  DME Arranged: Bedside commode, Walker rolling DME Agency: [employer] Date DME Agency Contacted: 07/21/24 Time DME Agency Contacted: 1001 Representative spoke with at DME Agency: [place]            Social Drivers of Health (SDOH) Interventions SDOH Screenings   Food Insecurity: No Food Insecurity (07/20/2024)  Housing: Low Risk  (07/20/2024)  Transportation Needs: No Transportation Needs (07/20/2024)  Utilities: Not At Risk (07/20/2024)  Financial Resource Strain: Unknown (01/01/2022)   Received from [facility] visits prior to 12/18/2022.  Physical Activity: Unknown (01/01/2022)   [facility] prior to [facility]/18/2022., [facility]  Social Connections: Socially Isolated (07/20/2024)  Stress: No Stress Concern Present (12/30/2020)   Received from [facility] visits prior to 12/18/2022.  Tobacco Use: Medium Risk (07/19/2024)     Readmission Risk Interventions     No data to display

## 2024-07-21 NOTE — Progress Notes (Signed)
 Physical Therapy Treatment Patient Details Name: Isaiah Price MRN: [id number] DOB: 1951/02/14 Today's Date: 07/21/2024   History of Present Illness Pt is a 73yo male who underwent PLIF L4-5 on 10/2. PMH: RA, HTN, GERD,  ACDF, laminectomy.    PT Comments  Pt required min assist bed mobility, CGA transfers, and supervision amb 350' with RW. Reviewed 3/3 back precautions. Cues needed to adhere to precautions during transition OOB. Reviewed brace wear schedule. Plan is for d/c home today. Current discharge recs remains appropriate.     If plan is discharge home, recommend the following: A little help with walking and/or transfers;A little help with bathing/dressing/bathroom;Help with stairs or ramp for entrance;Assist for transportation   Can travel by private vehicle        Equipment Recommendations  Rolling walker (2 wheels) (tall, pt is 6'4)    Recommendations for Other Services       Precautions / Restrictions Precautions Precautions: Back Recall of Precautions/Restrictions: Impaired Precaution/Restrictions Comments: verbal cues to adhere to precautions during transition OOB Required Braces or Orthoses: Spinal Brace Spinal Brace: Lumbar corset;Applied in sitting position     Mobility  Bed Mobility Overal bed mobility: Needs Assistance Bed Mobility: Rolling, Sidelying to Sit Rolling: Used rails, Contact guard assist Sidelying to sit: Contact guard assist, Used rails, HOB elevated       General bed mobility comments: max cues for sequencing    Transfers Overall transfer level: Needs assistance Equipment used: Rolling walker (2 wheels) Transfers: Sit to/from Stand Sit to Stand: Contact guard assist           General transfer comment: cues for hand placement    Ambulation/Gait Ambulation/Gait assistance: Supervision Gait Distance (Feet): 350 Feet Assistive device: Rolling walker (2 wheels) Gait Pattern/deviations: Step-through pattern, Decreased stride  length Gait velocity: decreased Gait velocity interpretation: 1.31 - 2.62 ft/sec, indicative of limited community ambulator   General Gait Details: Cues for posture and proximity to [employer] Mobility     Tilt Bed    Modified Rankin (Stroke Patients Only)       Balance Overall balance assessment: Needs assistance Sitting-balance support: Feet supported, Single extremity supported Sitting balance-Leahy Scale: Fair     Standing balance support: Bilateral upper extremity supported, During functional activity, Reliant on assistive device for balance Standing balance-Leahy Scale: Poor                              Communication Communication Communication: No apparent difficulties  Cognition Arousal: Alert Behavior During Therapy: Anxious   PT - Cognitive impairments: No family/caregiver present to determine baseline, Problem solving                       PT - Cognition Comments: Delayed processing. Difficulty staying on task. Following commands: Intact      Cueing Cueing Techniques: Verbal cues, Tactile cues  Exercises      General Comments        Pertinent Vitals/Pain Pain Assessment Pain Assessment: 0-10 Pain Score: 6  Pain Location: back Pain Descriptors / Indicators: Guarding, Grimacing, Sore Pain Intervention(s): RN gave pain meds during session, Monitored during session, Repositioned    Home Living                          Prior Function  PT Goals (current goals can now be found in the care plan section) Acute Rehab PT Goals Patient Stated Goal: home Progress towards PT goals: Progressing toward goals    Frequency    Min 5X/week      PT Plan      Co-evaluation              AM-PAC PT 6 Clicks Mobility   Outcome Measure  Help needed turning from your back to your side while in a flat bed without using bedrails?: A Little Help needed moving from lying on  your back to sitting on the side of a flat bed without using bedrails?: A Little Help needed moving to and from a bed to a chair (including a wheelchair)?: A Little Help needed standing up from a chair using your arms (e.g., wheelchair or bedside chair)?: A Little Help needed to walk in hospital room?: A Little Help needed climbing 3-5 steps with a railing? : A Little 6 Click Score: 18    End of Session Equipment Utilized During Treatment: Gait belt;Back brace Activity Tolerance: Patient tolerated treatment well Patient left: in chair;with call bell/phone within reach Nurse Communication: Mobility status PT Visit Diagnosis: Unsteadiness on feet (R26.81);Pain     Time: 9081-9057 PT Time Calculation (min) (ACUTE ONLY): 24 min  Charges:    $Gait Training: 23-37 mins PT General Charges $$ ACUTE PT VISIT: 1 Visit                     Sari MATSU., PT  Office # [phone]    Erven Sari Shaker 07/21/2024, 10:42 AM

## 2024-07-21 NOTE — Plan of Care (Signed)

## 2024-07-21 NOTE — Progress Notes (Signed)
 DME arrived to room. Reviewed AVS, patient expressed understanding of medications, MD follow up reviewed.   Patient states all belongings brought to the hospital at time of admission are accounted for and packed to take home.  Patient informed and expressed understanding where to pick up discharge medications.  This DC Nurse and primary tech transported patient to entrance A, where family member was waiting in vehicle to transport home.

## 2024-07-21 NOTE — Discharge Summary (Signed)
 Physician Discharge Summary  Patient ID: Isaiah Price MRN: [id number] DOB/AGE: 01-11-1951 73 y.o.  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 The patient tolerated the procedure well and was taken to the recovery room and then to the floor in stable condition. The hospital course was routine. There were no complications. The wound remained clean dry and intact. Pt had appropriate back soreness. No complaints of leg pain or new N/T/W. The patient remained afebrile with stable vital signs, and tolerated a regular diet. The patient continued to increase activities, and pain was well controlled with oral pain medications.   Consults: None  Significant Diagnostic Studies:  Results for orders placed or performed during the hospital encounter of 07/19/24  ABO/Rh   Collection Time: 07/19/24  8:37 AM  Result Value Ref Range   ABO/RH(D)      A POS Performed at [facility], [address], [place], [place] [address]     DG Lumbar Spine 1 View Result Date: 07/19/2024 CLINICAL DATA:  [id number] Surgery, elective [id number] EXAM: LUMBAR SPINE - 1 VIEW COMPARISON:  December 14, 2023 FINDINGS: Cross-table lateral images were obtained for surgical planning purposes. This demonstrates placement of metallic markers at the height of the superior endplate of L5 on image 1 and at the level of L4-5 on image 2. Osteopenia. Trace retrolisthesis of L5-S1 and L2-3. Intervertebral disc space height loss at L4-5. Atherosclerotic calcifications. Facet arthropathy. Please reference procedure report for further details. IMPRESSION: Cross-table lateral images were obtained for surgical planning purposes. Electronically Signed   By: Corean Salter M.D.   On: 07/19/2024  16:37   DG Lumbar Spine 2-3 Views Result Date: 07/19/2024 CLINICAL DATA:  [id number] Surgery, elective [id number] EXAM: DG LUMBAR SPINE 2-3V COMPARISON:  December 14, 2023 FINDINGS: Spot fluoroscopy images were obtained for surgical planning purposes. This demonstrates placement of posterior fixation with intervertebral spacer of the lower lumbar spine. Time: 9.8 seconds Dose: 5.26 mGy Please reference procedure report for further details. IMPRESSION: Spot fluoroscopy images obtained for surgical planning purposes. Electronically Signed   By: Corean Salter M.D.   On: 07/19/2024 16:34   DG C-Arm 1-60 Min-No Report Result Date: 07/19/2024 Fluoroscopy was utilized by the requesting physician.  No radiographic interpretation.   DG C-Arm 1-60 Min-No Report Result Date: 07/19/2024 Fluoroscopy was utilized by the requesting physician.  No radiographic interpretation.    Antibiotics:  Anti-infectives (From admission, onward)    Start     Dose/Rate Route Frequency Ordered Stop   07/19/24 1900  ceFAZolin  (ANCEF ) IVPB 2g/100 mL premix        2 g 200 mL/hr over 30 Minutes Intravenous Every 8 hours 07/19/24 1614 07/20/24 0749   07/19/24 1700  ceFAZolin  (ANCEF ) IVPB 2g/100 mL premix  Status:  Discontinued        2 g 200 mL/hr over 30 Minutes Intravenous Every 8 hours 07/19/24 1610 07/19/24 1614   07/19/24 1100  ceFAZolin  (ANCEF ) IVPB 2g/100 mL premix        2 g 200 mL/hr over 30 Minutes Intravenous  Once 07/19/24 1045 07/19/24 1110   07/19/24 1053  ceFAZolin  (ANCEF ) 2-4 GM/100ML-% IVPB       Note to Pharmacy: Joli Search N: cabinet override      07/19/24 1053  07/19/24 1124       Discharge Exam: Blood pressure 139/78, pulse 87, temperature 98.5 F (36.9 C), resp. rate 18, height 6' 4 (1.93 m), weight 95.3 kg, SpO2 97%. Neurologic: Grossly normal Dressing dry  Discharge Medications:   Allergies as of 07/21/2024       Reactions   Nsaids Other (See Comments)   Causes GI bleeds (numerous  times)   Brimonidine Itching, Other (See Comments)   redness Abdominal Pain   Gabapentin  Other (See Comments)   Couldn't talk   Meloxicam Other (See Comments)   Promethazine  Hcl    Vision changes   Trazodone    Bladder incontinence        Medication List     TAKE these medications    acetaminophen  500 MG tablet Commonly known as: TYLENOL  Take 1,000 mg by mouth every 6 (six) hours as needed for moderate pain (pain score 4-6).   allopurinol 100 MG tablet Commonly known as: ZYLOPRIM Take 200 mg by mouth daily.   chlorhexidine  4 % external liquid Commonly known as: HIBICLENS Apply 15 mLs (1 Application total) topically as directed for 30 doses. Use as directed daily for 5 days every other week for 6 weeks.   dexamethasone  1 MG tablet Commonly known as: DECADRON  2 tablets twice daily for 2 days, one tablet twice daily for 2 days, one tablet daily for 2 days.   diltiazem  90 MG tablet Commonly known as: CARDIZEM  Take 90 mg by mouth at bedtime.   fluticasone  50 MCG/ACT nasal spray Commonly known as: FLONASE  Place 1 spray into the nose daily as needed for allergies.   HYDROcodone -acetaminophen  10-325 MG tablet Commonly known as: NORCO TAKE 1 TABLET EVERY 6 HOURS AS NEEDED FOR MODERATE TO SEVERE PAIN. Use sparingly What changed:  how much to take how to take this when to take this reasons to take this additional instructions   methocarbamol  500 MG tablet Commonly known as: ROBAXIN  Take 1 tablet (500 mg total) by mouth every 6 (six) hours as needed for muscle spasms.   mupirocin ointment 2 % Commonly known as: BACTROBAN Place 1 Application into the nose 2 (two) times daily for 60 doses. Use as directed 2 times daily for 5 days every other week for 6 weeks.   nortriptyline  10 MG capsule Commonly known as: PAMELOR  Take 2 capsules at night   omeprazole 40 MG capsule Commonly known as: PRILOSEC Take 40 mg by mouth daily.   oxyCODONE-acetaminophen  5-325 MG  tablet Commonly known as: PERCOCET/ROXICET Take 1-2 tablets by mouth every 4 (four) hours as needed for severe pain (pain score 7-10) or moderate pain (pain score 4-6).   REFRESH OP Place 1 drop into both eyes 2 (two) times daily as needed (dry eyes).   rosuvastatin  40 MG tablet Commonly known as: CRESTOR  TAKE 1 TABLET BY MOUTH DAILY What changed: when to take this   tiZANidine  4 MG tablet Commonly known as: ZANAFLEX  Take 1 tablet (4 mg total) by mouth every 8 (eight) hours as needed for muscle spasms.   tretinoin 0.05 % cream Commonly known as: RETIN-A Apply 1 Application topically at bedtime as needed (irritation).   zolpidem  10 MG tablet Commonly known as: AMBIEN  Take 5-10 mg by mouth at bedtime as needed for sleep.               Durable Medical Equipment  (From admission, onward)           Start     Ordered  07/20/24 1528  For home use only DME Walker rolling  Once       Question Answer Comment  Walker: With 5 Inch Wheels   Patient needs a walker to treat with the following condition S/P lumbar spinal fusion      07/20/24 1527   07/20/24 1528  For home use only DME 3 n 1  Once        07/20/24 1527            Disposition: home   Final Dx: PLIF L4-5  Discharge Instructions     Call MD for:  difficulty breathing, headache or visual disturbances   Complete by: As directed    Call MD for:  persistant nausea and vomiting   Complete by: As directed    Call MD for:  redness, tenderness, or signs of infection (pain, swelling, redness, odor or green/yellow discharge around incision site)   Complete by: As directed    Call MD for:  severe uncontrolled pain   Complete by: As directed    Call MD for:  temperature >100.4   Complete by: As directed    Diet - low sodium heart healthy   Complete by: As directed    Incentive spirometry RT   Complete by: As directed    Increase activity slowly   Complete by: As directed    Remove dressing in 48 hours    Complete by: As directed         Follow-up Information     [facility] Follow up.   Specialty: Home Health Services Why: [facility] will contact you for the first home visit. Contact information: [address] [place] [address] [phone]                  Signed: Alm GORMAN Molt 07/21/2024, 10:14 AM

## 2024-07-21 NOTE — Progress Notes (Signed)
 Discharge summary (AVS) provided to pt with instructions Pt verbalized understanding of instructions. NO complaints.  D/C lounge offerred but pt refused.. still awaiting for DMEs to be delivered.SABRA

## 2024-07-23 ENCOUNTER — Encounter: Payer: Self-pay | Admitting: Family Medicine

## 2024-07-23 MED FILL — Sodium Chloride IV Soln 0.9%: INTRAVENOUS | Qty: 2000 | Status: AC

## 2024-07-23 MED FILL — Heparin Sodium (Porcine) Inj 1000 Unit/ML: INTRAMUSCULAR | Qty: 30 | Status: AC

## 2024-08-22 ENCOUNTER — Other Ambulatory Visit: Payer: Self-pay | Admitting: Medical Genetics

## 2024-08-22 DIAGNOSIS — Z006 Encounter for examination for normal comparison and control in clinical research program: Secondary | ICD-10-CM

## 2024-09-26 ENCOUNTER — Other Ambulatory Visit: Payer: Self-pay | Admitting: Cardiology

## 2024-11-15 ENCOUNTER — Ambulatory Visit: Admitting: Cardiology

## 2025-04-04 ENCOUNTER — Ambulatory Visit: Admitting: Cardiology
